# Patient Record
Sex: Female | Born: 1960
Health system: Southern US, Community
[De-identification: ages and names within clinical notes are randomized; demographics above are authoritative.]

## PROBLEM LIST (undated history)

## (undated) DIAGNOSIS — R609 Edema, unspecified: Secondary | ICD-10-CM

## (undated) DIAGNOSIS — F32A Depression, unspecified: Secondary | ICD-10-CM

## (undated) DIAGNOSIS — K219 Gastro-esophageal reflux disease without esophagitis: Secondary | ICD-10-CM

## (undated) DIAGNOSIS — K6389 Other specified diseases of intestine: Secondary | ICD-10-CM

## (undated) DIAGNOSIS — K5090 Crohn's disease, unspecified, without complications: Secondary | ICD-10-CM

## (undated) DIAGNOSIS — F329 Major depressive disorder, single episode, unspecified: Secondary | ICD-10-CM

## (undated) DIAGNOSIS — I10 Essential (primary) hypertension: Secondary | ICD-10-CM

## (undated) DIAGNOSIS — L709 Acne, unspecified: Secondary | ICD-10-CM

## (undated) DIAGNOSIS — N943 Premenstrual tension syndrome: Secondary | ICD-10-CM

## (undated) DIAGNOSIS — E785 Hyperlipidemia, unspecified: Secondary | ICD-10-CM

## (undated) DIAGNOSIS — F419 Anxiety disorder, unspecified: Secondary | ICD-10-CM

## (undated) HISTORY — DX: Other specified diseases of intestine: K63.89

## (undated) HISTORY — DX: Edema, unspecified: R60.9

## (undated) HISTORY — DX: Major depressive disorder, single episode, unspecified: F32.9

## (undated) HISTORY — DX: Crohn's disease, unspecified, without complications: K50.90

## (undated) HISTORY — PX: COLON SURGERY: SHX602

## (undated) HISTORY — DX: Essential (primary) hypertension: I10

## (undated) HISTORY — DX: Premenstrual tension syndrome: N94.3

## (undated) HISTORY — DX: Depression, unspecified: F32.A

## (undated) HISTORY — DX: Hyperlipidemia, unspecified: E78.5

## (undated) HISTORY — DX: Anxiety disorder, unspecified: F41.9

## (undated) HISTORY — DX: Acne, unspecified: L70.9

## (undated) HISTORY — DX: Gastro-esophageal reflux disease without esophagitis: K21.9

---

## 1999-04-24 ENCOUNTER — Other Ambulatory Visit: Admission: RE | Admit: 1999-04-24 | Discharge: 1999-04-24 | Payer: Self-pay | Admitting: Obstetrics and Gynecology

## 2000-06-20 ENCOUNTER — Other Ambulatory Visit: Admission: RE | Admit: 2000-06-20 | Discharge: 2000-06-20 | Payer: Self-pay | Admitting: Obstetrics and Gynecology

## 2002-08-16 ENCOUNTER — Other Ambulatory Visit: Admission: RE | Admit: 2002-08-16 | Discharge: 2002-08-16 | Payer: Self-pay | Admitting: Obstetrics and Gynecology

## 2004-10-29 ENCOUNTER — Ambulatory Visit: Payer: Self-pay | Admitting: Internal Medicine

## 2006-03-10 ENCOUNTER — Other Ambulatory Visit: Admission: RE | Admit: 2006-03-10 | Discharge: 2006-03-10 | Payer: Self-pay | Admitting: Obstetrics & Gynecology

## 2006-06-16 ENCOUNTER — Inpatient Hospital Stay (HOSPITAL_COMMUNITY): Admission: RE | Admit: 2006-06-16 | Discharge: 2006-06-20 | Payer: Self-pay | Admitting: *Deleted

## 2006-06-16 ENCOUNTER — Ambulatory Visit: Payer: Self-pay | Admitting: Psychiatry

## 2008-06-27 ENCOUNTER — Ambulatory Visit: Payer: Self-pay | Admitting: Family Medicine

## 2008-06-27 DIAGNOSIS — F341 Dysthymic disorder: Secondary | ICD-10-CM

## 2008-06-27 DIAGNOSIS — L708 Other acne: Secondary | ICD-10-CM

## 2008-06-27 DIAGNOSIS — R609 Edema, unspecified: Secondary | ICD-10-CM

## 2008-07-26 ENCOUNTER — Ambulatory Visit: Payer: Self-pay | Admitting: Family Medicine

## 2008-07-30 LAB — CONVERTED CEMR LAB
ALT: 28 units/L (ref 0–35)
BUN: 13 mg/dL (ref 6–23)
CO2: 21 meq/L (ref 19–32)
Creatinine, Ser: 0.76 mg/dL (ref 0.40–1.20)
Glucose, Bld: 98 mg/dL (ref 70–99)
Potassium: 4 meq/L (ref 3.5–5.3)
Total Bilirubin: 0.3 mg/dL (ref 0.3–1.2)
Total Protein: 7.4 g/dL (ref 6.0–8.3)

## 2008-08-01 ENCOUNTER — Ambulatory Visit: Payer: Self-pay | Admitting: Family Medicine

## 2008-08-01 DIAGNOSIS — R7989 Other specified abnormal findings of blood chemistry: Secondary | ICD-10-CM | POA: Insufficient documentation

## 2008-08-02 ENCOUNTER — Ambulatory Visit: Payer: Self-pay | Admitting: *Deleted

## 2008-08-09 ENCOUNTER — Ambulatory Visit: Payer: Self-pay | Admitting: *Deleted

## 2008-08-22 ENCOUNTER — Ambulatory Visit: Payer: Self-pay | Admitting: Family Medicine

## 2008-08-22 DIAGNOSIS — R740 Nonspecific elevation of levels of transaminase and lactic acid dehydrogenase [LDH]: Secondary | ICD-10-CM

## 2008-08-22 DIAGNOSIS — I10 Essential (primary) hypertension: Secondary | ICD-10-CM | POA: Insufficient documentation

## 2008-08-22 DIAGNOSIS — R7401 Elevation of levels of liver transaminase levels: Secondary | ICD-10-CM | POA: Insufficient documentation

## 2008-08-22 LAB — CONVERTED CEMR LAB
ALT: 99 units/L — ABNORMAL HIGH (ref 0–35)
AST: 54 units/L — ABNORMAL HIGH (ref 0–37)
Albumin: 3.7 g/dL (ref 3.5–5.2)
Alkaline Phosphatase: 239 units/L — ABNORMAL HIGH (ref 39–117)
Ferritin: 11.1 ng/mL (ref 10.0–291.0)
Total Bilirubin: 0.6 mg/dL (ref 0.3–1.2)
Total Protein: 7.5 g/dL (ref 6.0–8.3)

## 2008-08-26 ENCOUNTER — Encounter: Admission: RE | Admit: 2008-08-26 | Discharge: 2008-08-26 | Payer: Self-pay | Admitting: Family Medicine

## 2008-08-27 ENCOUNTER — Telehealth: Payer: Self-pay | Admitting: Family Medicine

## 2008-09-02 ENCOUNTER — Ambulatory Visit: Payer: Self-pay | Admitting: Family Medicine

## 2008-09-04 LAB — CONVERTED CEMR LAB
AST: 28 units/L (ref 0–37)
Alkaline Phosphatase: 230 units/L — ABNORMAL HIGH (ref 39–117)
Ceruloplasmin: 50 mg/dL (ref 21–63)
HCV Ab: NEGATIVE
Hep B C IgM: NEGATIVE
Total Bilirubin: 0.6 mg/dL (ref 0.3–1.2)

## 2008-09-25 ENCOUNTER — Ambulatory Visit: Payer: Self-pay | Admitting: Gastroenterology

## 2008-09-25 DIAGNOSIS — R945 Abnormal results of liver function studies: Secondary | ICD-10-CM | POA: Insufficient documentation

## 2008-09-25 DIAGNOSIS — R1084 Generalized abdominal pain: Secondary | ICD-10-CM | POA: Insufficient documentation

## 2008-09-25 LAB — CONVERTED CEMR LAB
Alkaline Phosphatase: 199 units/L — ABNORMAL HIGH (ref 39–117)
Bilirubin, Direct: 0.1 mg/dL (ref 0.0–0.3)
INR: 1 (ref 0.8–1.0)
Prothrombin Time: 12.2 s (ref 10.9–13.3)

## 2008-10-01 ENCOUNTER — Telehealth: Payer: Self-pay | Admitting: Gastroenterology

## 2008-10-08 ENCOUNTER — Encounter: Payer: Self-pay | Admitting: Gastroenterology

## 2008-10-08 ENCOUNTER — Ambulatory Visit: Payer: Self-pay | Admitting: Gastroenterology

## 2008-10-08 LAB — CONVERTED CEMR LAB
Basophils Relative: 3.3 % — ABNORMAL HIGH (ref 0.0–3.0)
Eosinophils Absolute: 0.1 10*3/uL (ref 0.0–0.7)
Eosinophils Relative: 1.3 % (ref 0.0–5.0)
INR: 1 (ref 0.8–1.0)
Lymphocytes Relative: 19.5 % (ref 12.0–46.0)
MCHC: 34.4 g/dL (ref 30.0–36.0)
Neutro Abs: 5.6 10*3/uL (ref 1.4–7.7)
Neutrophils Relative %: 72 % (ref 43.0–77.0)
Prothrombin Time: 11.3 s (ref 10.9–13.3)
RBC: 4.51 M/uL (ref 3.87–5.11)
RDW: 12.7 % (ref 11.5–14.6)

## 2008-10-11 ENCOUNTER — Ambulatory Visit: Payer: Self-pay | Admitting: Internal Medicine

## 2008-10-16 ENCOUNTER — Ambulatory Visit: Payer: Self-pay | Admitting: Gastroenterology

## 2008-10-16 DIAGNOSIS — R1903 Right lower quadrant abdominal swelling, mass and lump: Secondary | ICD-10-CM | POA: Insufficient documentation

## 2008-10-22 ENCOUNTER — Encounter: Payer: Self-pay | Admitting: Family Medicine

## 2008-10-22 ENCOUNTER — Encounter: Payer: Self-pay | Admitting: Gastroenterology

## 2008-11-08 DIAGNOSIS — K6389 Other specified diseases of intestine: Secondary | ICD-10-CM

## 2008-11-08 HISTORY — DX: Other specified diseases of intestine: K63.89

## 2008-11-08 HISTORY — PX: HEMICOLECTOMY: SHX854

## 2008-11-11 ENCOUNTER — Encounter: Payer: Self-pay | Admitting: Family Medicine

## 2008-11-11 ENCOUNTER — Encounter (INDEPENDENT_AMBULATORY_CARE_PROVIDER_SITE_OTHER): Payer: Self-pay | Admitting: General Surgery

## 2008-11-11 ENCOUNTER — Inpatient Hospital Stay (HOSPITAL_COMMUNITY): Admission: RE | Admit: 2008-11-11 | Discharge: 2008-11-14 | Payer: Self-pay | Admitting: General Surgery

## 2008-11-11 ENCOUNTER — Encounter: Payer: Self-pay | Admitting: Gastroenterology

## 2008-11-13 ENCOUNTER — Telehealth: Payer: Self-pay | Admitting: Gastroenterology

## 2008-11-25 ENCOUNTER — Ambulatory Visit: Payer: Self-pay | Admitting: Gastroenterology

## 2008-11-25 DIAGNOSIS — K5080 Crohn's disease of both small and large intestine without complications: Secondary | ICD-10-CM | POA: Insufficient documentation

## 2008-11-25 DIAGNOSIS — K5090 Crohn's disease, unspecified, without complications: Secondary | ICD-10-CM | POA: Insufficient documentation

## 2008-11-25 LAB — CONVERTED CEMR LAB
ALT: 28 units/L (ref 0–35)
AST: 21 units/L (ref 0–37)
Alkaline Phosphatase: 129 units/L — ABNORMAL HIGH (ref 39–117)
Basophils Absolute: 0.1 10*3/uL (ref 0.0–0.1)
Basophils Relative: 0.8 % (ref 0.0–3.0)
Eosinophils Absolute: 0.1 10*3/uL (ref 0.0–0.7)
Eosinophils Relative: 1.6 % (ref 0.0–5.0)
HCT: 32.5 % — ABNORMAL LOW (ref 36.0–46.0)
Hemoglobin: 11 g/dL — ABNORMAL LOW (ref 12.0–15.0)
Monocytes Absolute: 0.6 10*3/uL (ref 0.1–1.0)
Monocytes Relative: 8.5 % (ref 3.0–12.0)
Neutro Abs: 4.5 10*3/uL (ref 1.4–7.7)
Neutrophils Relative %: 64.2 % (ref 43.0–77.0)
Platelets: 469 10*3/uL — ABNORMAL HIGH (ref 150–400)
RDW: 13.4 % (ref 11.5–14.6)

## 2008-11-27 ENCOUNTER — Encounter: Payer: Self-pay | Admitting: Gastroenterology

## 2008-11-27 ENCOUNTER — Encounter: Payer: Self-pay | Admitting: Family Medicine

## 2008-11-28 ENCOUNTER — Telehealth: Payer: Self-pay | Admitting: Gastroenterology

## 2008-12-05 ENCOUNTER — Ambulatory Visit: Payer: Self-pay | Admitting: Gastroenterology

## 2008-12-27 ENCOUNTER — Ambulatory Visit: Payer: Self-pay | Admitting: Gastroenterology

## 2008-12-31 ENCOUNTER — Telehealth: Payer: Self-pay | Admitting: Gastroenterology

## 2008-12-31 LAB — CONVERTED CEMR LAB
ALT: 37 units/L — ABNORMAL HIGH (ref 0–35)
Bilirubin, Direct: 0.1 mg/dL (ref 0.0–0.3)
Eosinophils Absolute: 0.1 10*3/uL (ref 0.0–0.7)
Eosinophils Relative: 0.7 % (ref 0.0–5.0)
Hemoglobin: 11.9 g/dL — ABNORMAL LOW (ref 12.0–15.0)
Monocytes Absolute: 0.6 10*3/uL (ref 0.1–1.0)
Neutro Abs: 5.4 10*3/uL (ref 1.4–7.7)
Platelets: 498 10*3/uL — ABNORMAL HIGH (ref 150–400)
RBC: 4.26 M/uL (ref 3.87–5.11)
RDW: 13.3 % (ref 11.5–14.6)

## 2009-01-22 ENCOUNTER — Telehealth: Payer: Self-pay | Admitting: Gastroenterology

## 2009-02-10 ENCOUNTER — Ambulatory Visit: Payer: Self-pay | Admitting: Gastroenterology

## 2009-02-10 ENCOUNTER — Telehealth: Payer: Self-pay | Admitting: Gastroenterology

## 2009-02-10 LAB — CONVERTED CEMR LAB
ALT: 19 units/L (ref 0–35)
Albumin: 3.7 g/dL (ref 3.5–5.2)
Alkaline Phosphatase: 121 units/L — ABNORMAL HIGH (ref 39–117)
Basophils Absolute: 0 10*3/uL (ref 0.0–0.1)
Basophils Relative: 0.8 % (ref 0.0–3.0)
Hemoglobin: 11.9 g/dL — ABNORMAL LOW (ref 12.0–15.0)
Lymphocytes Relative: 27.7 % (ref 12.0–46.0)
Neutro Abs: 3.7 10*3/uL (ref 1.4–7.7)
Total Bilirubin: 0.8 mg/dL (ref 0.3–1.2)
Total Protein: 6.9 g/dL (ref 6.0–8.3)
WBC: 5.7 10*3/uL (ref 4.5–10.5)

## 2009-02-14 ENCOUNTER — Ambulatory Visit: Payer: Self-pay | Admitting: Gastroenterology

## 2009-03-04 IMAGING — CT CT ABDOMEN WO/W CM
2 of 9 series · 11 of 46 positions shown, 17 images · IV contrast (Omnipaque 300)
Comparison: None

CT ABDOMEN

CLINICAL DATA: Elevated liver function tests.  Large right colon
mass recently diagnosed by colonoscopy.

CT ABDOMEN AND PELVIS WITHOUT AND WITH CONTRAST
TECHNIQUE: Multidetector CT imaging of the abdomen and pelvis was
performed following the standard protocol before and following the
bolus administration of intravenous contrast.
Contrast: 125 ml 5mnipaque-NUU and oral contrast

[Series 6: ap_venous 5.0 b30f · axial · 0.82mm/px · z∈[-503,-123]mm · 8 of 98 slices shown, 13 images]
[im 11/98  soft-tissue]
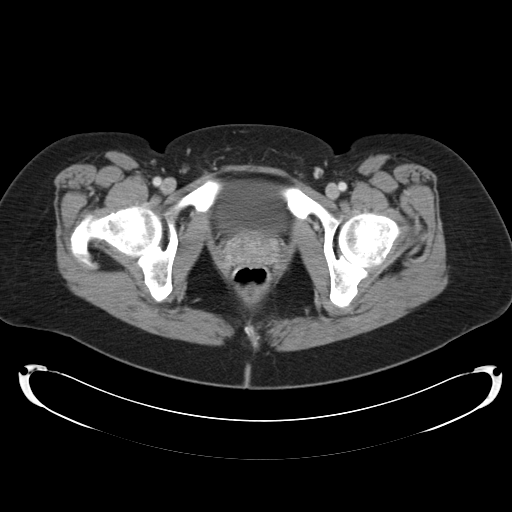
[im 11/98  bone]
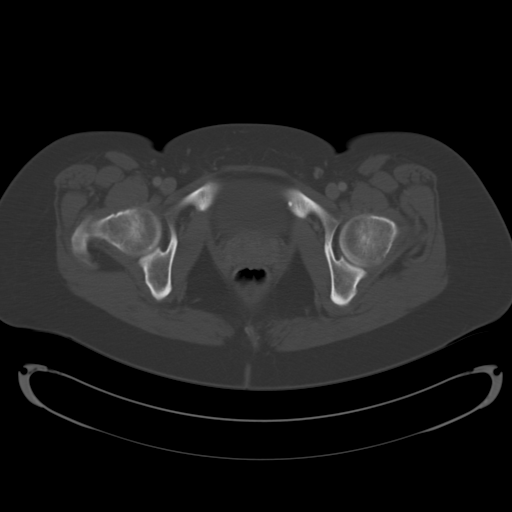
[im 22/98  soft-tissue]
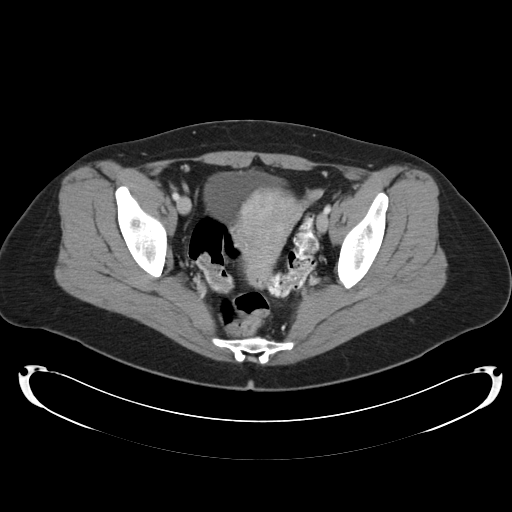
[im 33/98  soft-tissue]
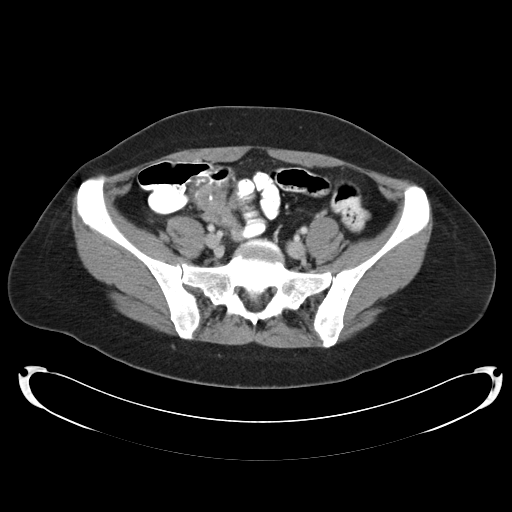
[im 44/98  soft-tissue]
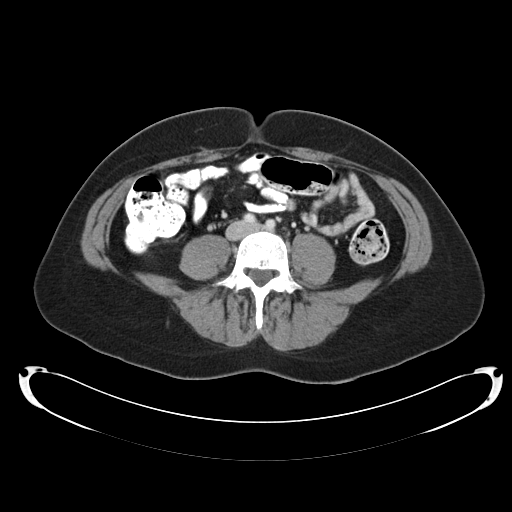
[im 54/98  soft-tissue]
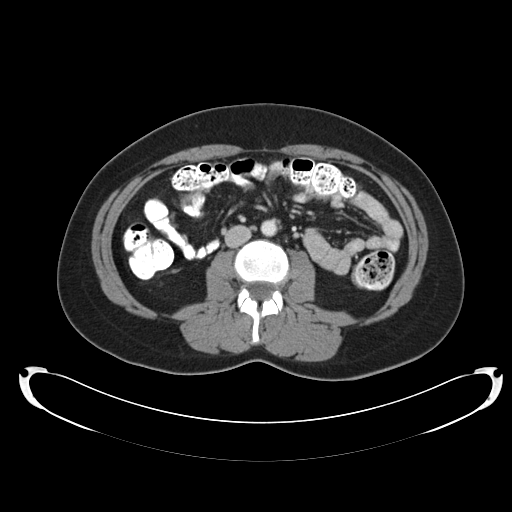
[im 54/98  lung]
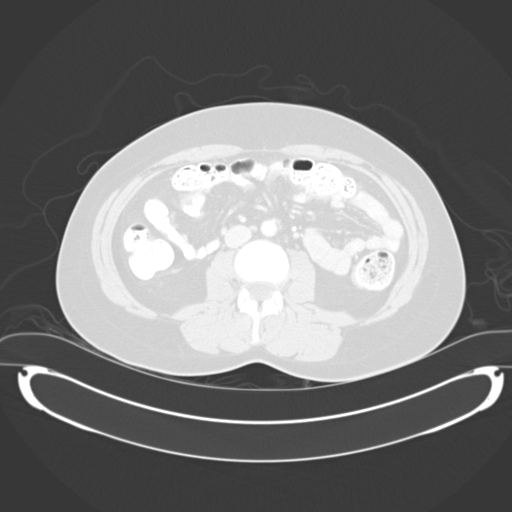
[im 65/98  soft-tissue]
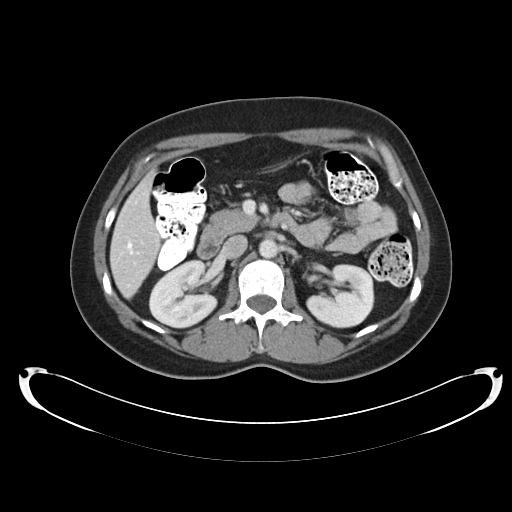
[im 65/98  lung]
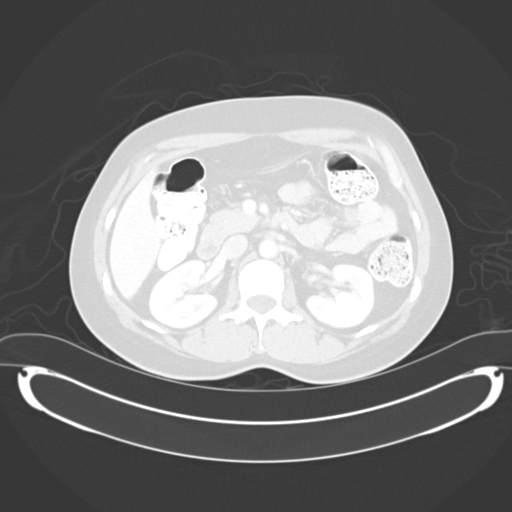
[im 76/98  soft-tissue]
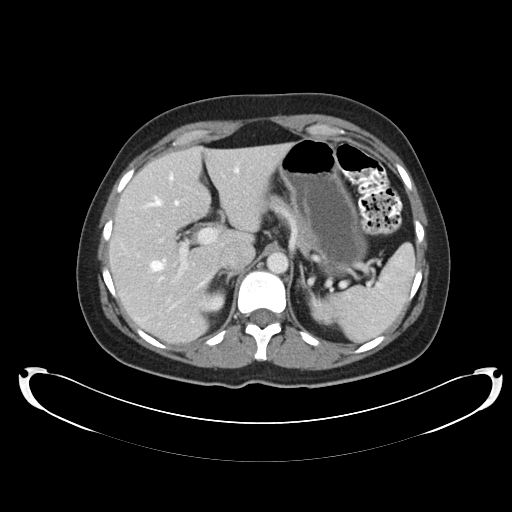
[im 76/98  lung]
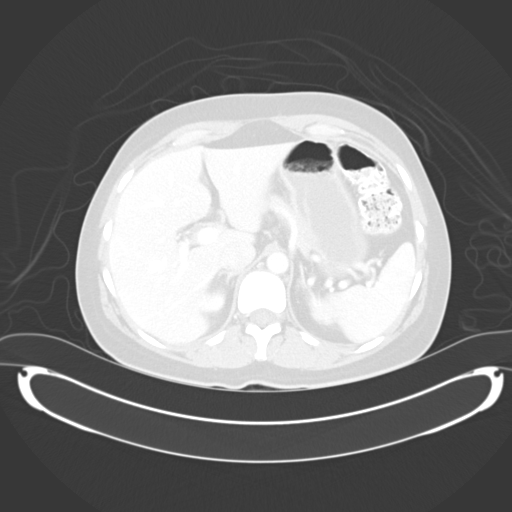
[im 87/98  soft-tissue]
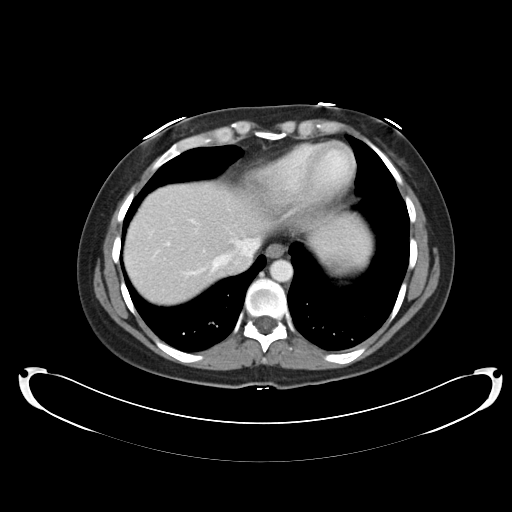
[im 87/98  lung]
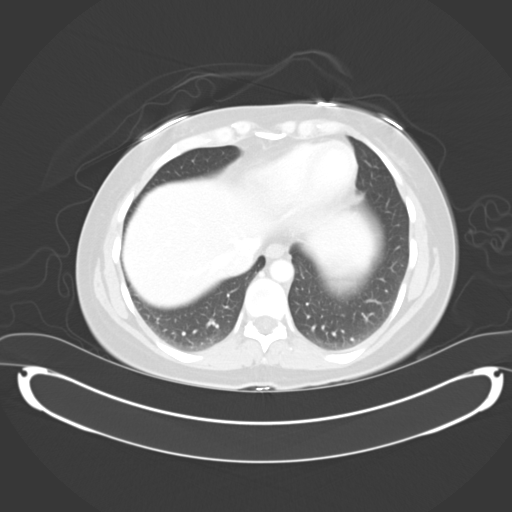

[Series 602: <mpr thick range> · coronal · 0.82mm/px · 3 of 71 slices shown, 4 images]
[im 18/71  soft-tissue]
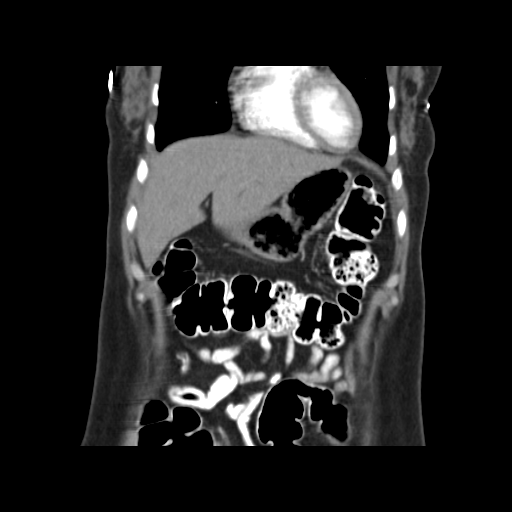
[im 36/71  soft-tissue]
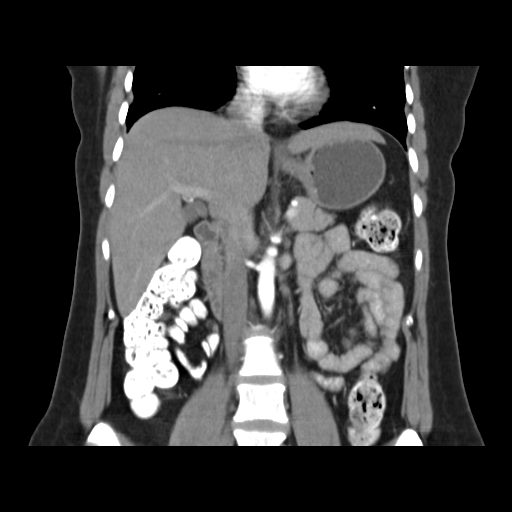
[im 36/71  bone]
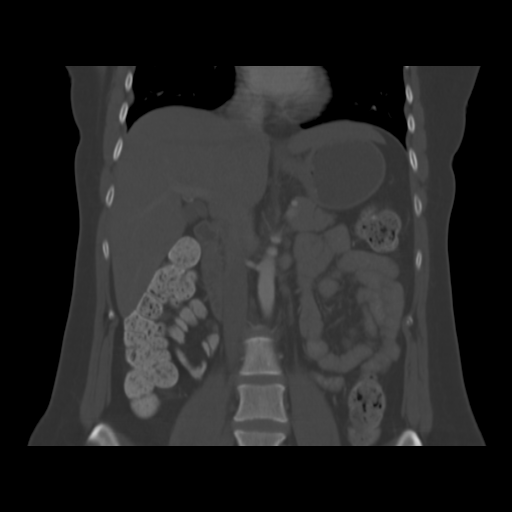
[im 53/71  soft-tissue]
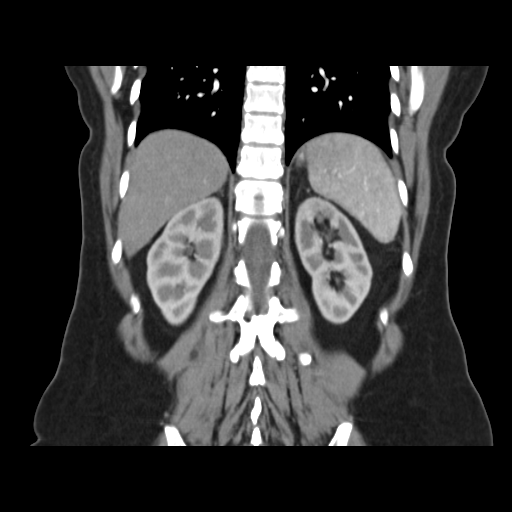

[11 of 46 positions shown; findings below may reference images not displayed]

FINDINGS: Precontrast images show the presence of a small less
than 5 mm calculus in the mid pole of the left kidney.  There is no
evidence of hydronephrosis.

Postcontrast images show no evidence of renal masses.  A tiny less
than 1 cm cyst is seen in the mid pole left kidney and a tiny less
than 1 cm cyst is also noted in the inferior aspect of the spleen.

The liver and gallbladder are normal appearance.  The pancreas is
also normal appearance as well as both adrenal glands.  There is no
evidence of abdominal lymphadenopathy or soft tissue masses.

There is no evidence of inflammatory process or abnormal fluid
collections within the abdomen.  Abdominal bowel loops are
unremarkable in appearance.
IMPRESSION: 1.  No evidence of abdominal metastatic disease or other acute
findings.
2.  Single small nonobstructing left intrarenal calculus.  No
evidence of hydronephrosis.

CT PELVIS
FINDINGS: A heterogeneously enhancing soft tissue mass is seen,
which involves the base of the cecum in expected region of the
appendix.  This measures 3.8 x 3.8 cm.  This mass has very poorly
defined margins and also shows central calcification.  These
features raise suspicion for appendiceal carcinoid tumor, although
differential diagnosis also includes colonic adenocarcinoma,
ovarian neoplasm or endometriosis with extrinsic involvement of the
cecum, chronic appendicitis and lymphoma.

There are small shotty mesenteric lymph nodes seen in the right
lower quadrant superior to this mass, all measuring less than 1 cm.
There is no evidence of other soft tissue masses or acute
inflammatory process.  No abnormal fluid collections are seen.
There is no evidence of bowel obstruction.
IMPRESSION: 1. 3.8 cm soft tissue mass involving the base of the cecum in the
region of the appendix, which shows central calcification.  These
features are highly suspicious for an appendiceal carcinoid tumor.
Differential diagnosis also includes colonic adenocarcinoma,
ovarian neoplasm or endometriosis with extrinsic involvement of the
cecum, chronic appendicitis, and lymphoma.
2.  Adjacent shotty lymph nodes in right lower quadrant small bowel
mesentery.

## 2009-03-24 ENCOUNTER — Ambulatory Visit: Payer: Self-pay | Admitting: Gastroenterology

## 2009-03-24 ENCOUNTER — Telehealth: Payer: Self-pay | Admitting: Gastroenterology

## 2009-03-24 LAB — CONVERTED CEMR LAB
ALT: 22 units/L (ref 0–35)
AST: 21 units/L (ref 0–37)
Basophils Relative: 0.4 % (ref 0.0–3.0)
Eosinophils Absolute: 0.1 10*3/uL (ref 0.0–0.7)
HCT: 36.4 % (ref 36.0–46.0)
Lymphocytes Relative: 15.8 % (ref 12.0–46.0)
Lymphs Abs: 1.3 10*3/uL (ref 0.7–4.0)
MCHC: 34.7 g/dL (ref 30.0–36.0)
MCV: 87.3 fL (ref 78.0–100.0)
Monocytes Absolute: 0.5 10*3/uL (ref 0.1–1.0)
Neutrophils Relative %: 76.3 % (ref 43.0–77.0)
RDW: 18.3 % — ABNORMAL HIGH (ref 11.5–14.6)
Total Bilirubin: 0.7 mg/dL (ref 0.3–1.2)
WBC: 8.1 10*3/uL (ref 4.5–10.5)

## 2009-03-25 ENCOUNTER — Ambulatory Visit: Payer: Self-pay | Admitting: Family Medicine

## 2009-03-25 LAB — CONVERTED CEMR LAB
HDL goal, serum: 40 mg/dL
LDL Goal: 160 mg/dL

## 2009-04-15 ENCOUNTER — Encounter (INDEPENDENT_AMBULATORY_CARE_PROVIDER_SITE_OTHER): Payer: Self-pay | Admitting: *Deleted

## 2009-04-16 ENCOUNTER — Telehealth: Payer: Self-pay | Admitting: Gastroenterology

## 2009-05-14 ENCOUNTER — Encounter: Payer: Self-pay | Admitting: Family Medicine

## 2009-09-18 ENCOUNTER — Telehealth: Payer: Self-pay | Admitting: Family Medicine

## 2010-02-19 ENCOUNTER — Telehealth: Payer: Self-pay | Admitting: Family Medicine

## 2010-12-10 NOTE — Progress Notes (Signed)
Summary: refill request for ambien  Phone Note Refill Request Message from:  Fax from Pharmacy  Refills Requested: Medication #1:  AMBIEN 10 MG TABS 1 by mouth at bedtime as needed.   Last Refilled: 09/18/2009 Faxed request from [address], [phone].  Initial call taken by: Marty Heck CMA,  February 19, 2010 9:10 AM  Follow-up for Phone Call        px written on EMR for call in  Follow-up by: Allena Earing MD,  February 19, 2010 9:16 AM  Additional Follow-up for Phone Call Additional follow up Details #1::        Medication phoned to [facility] as instructed. Ozzie Hoyle LPN  February 20, 2492 2:41 AM     New/Updated Medications: AMBIEN 10 MG TABS (ZOLPIDEM TARTRATE) 1 by mouth at bedtime as needed Prescriptions: AMBIEN 10 MG TABS (ZOLPIDEM TARTRATE) 1 by mouth at bedtime as needed  #30 x 0   Entered and Authorized by:   Allena Earing MD   Signed by:   Ozzie Hoyle LPN on 99/14/4458   Method used:   Telephoned to ...       [facility] (retail)       [address]       [place], [place]  [address]       Ph: [phone] or [phone]       Fax: [phone]   RxID:   [id number]

## 2011-02-22 LAB — BASIC METABOLIC PANEL
CO2: 29 mEq/L (ref 19–32)
Chloride: 100 mEq/L (ref 96–112)
Creatinine, Ser: 0.64 mg/dL (ref 0.4–1.2)
GFR calc non Af Amer: >60 mL/min (ref >60)
Glucose, Bld: 125 mg/dL — ABNORMAL HIGH (ref 70–99)

## 2011-02-22 LAB — CBC
Hemoglobin: 10.4 g/dL — ABNORMAL LOW (ref 12.0–15.0)
RBC: 3.81 MIL/uL — ABNORMAL LOW (ref 3.87–5.11)
RDW: 14 % (ref 11.5–15.5)

## 2011-03-23 NOTE — Discharge Summary (Signed)
[name], [name] NO.:  [id number]   MEDICAL RECORD NO.:  [id number]          PATIENT TYPE:  INP   LOCATION:  5118                         FACILITY:  [facility]   PHYSICIAN:  Dickey Gave, MDDATE OF BIRTH:  1961-03-08   DATE OF ADMISSION:  11/11/2008  DATE OF DISCHARGE:  11/14/2008                               DISCHARGE SUMMARY   ADMISSION DIAGNOSES:  1. Intraabdominal mass.  2. Hypertension.  3. Depression.   DISCHARGE DIAGNOSES:  1. Intraabdominal mass.  2. Hypertension.  3. Depression.   PROCEDURES PERFORMED:  Laparoscopic-assisted right colectomy on November 11, 2008.   DISCHARGE MEDICATIONS:  Percocet as needed and Xanax p.r.n.   FOLLOWUP:  Followup in 1 week for staple removal in the General Surgery  Clinic.   HISTORY OF PRESENT ILLNESS:  Veronica [name] is a 50 year old female who on  evaluation for some abnormal liver function test underwent a colonoscopy  due to her family history, which showed a flat-based lesion in the cecum  suggestive of villous adenoma or carcinoma.  On biopsy, they showed  inflamed colonic mucosa with chronic changes.  She then underwent a CT  scan, which showed a 3.8 x 3.8 cm enhancing soft tissue mass involving  the base of the cecum in the region of the appendix.  She was referred  for surgical evaluation.  I took her on January 4, for a laparoscopic-  assisted right colectomy, removed the mass in the mesentery associated  with the terminal ileum and the cecum.  Over the next 3 days, she was  advanced on her diet.  She was on Entereg during this hospital stay.  By  postop day 3, she was passing flatus, had a small bowel movement and was  tolerating her diet and I discharged her home with her incision being  clean without any evidence of infection.      Dickey Gave, MD  Electronically Signed     MCW/MEDQ  D:  12/25/2008  T:  12/25/2008  Job:  [id number]   cc:   Sandy Salaam. Deatra Ina, MD,FACG

## 2011-03-23 NOTE — Op Note (Signed)
NAMECHELISE, HANGER NO.:  [id number]   MEDICAL RECORD NO.:  [id number]          PATIENT TYPE:  INP   LOCATION:  5118                         FACILITY:  [facility]   PHYSICIAN:  Dickey Gave, MDDATE OF BIRTH:  1960/12/21   DATE OF PROCEDURE:  11/11/2008  DATE OF DISCHARGE:                               OPERATIVE REPORT   PREOPERATIVE DIAGNOSIS:  Right colon mass.   POSTOPERATIVE DIAGNOSIS:  Right colon/terminal ileal mesenteric mass.   PROCEDURE:  Laparoscopic-assisted right hemicolectomy.   SURGEON:  Mila Homer. Donne Hazel, MD   ASSISTANT:  Haywood Lasso, MD   ANESTHESIA:  General.   FINDINGS:  Mass in mesentery associated with terminal ileum and cecum  abutting the cecum.   SPECIMENS:  Right colon to Pathology.   ESTIMATED BLOOD LOSS:  75 mL.   COMPLICATIONS:  None.   DRAINS:  None.   DISPOSITION:  To the recovery room in stable condition.   INDICATIONS:  Mrs. Serpa is a 50 year old female who was seen by her  primary care physician for evaluation of a blood pressure.  She was  noticed on lab evaluation to have some abnormal liver function tests.  She was then referred to see Dr. Erskine Emery for evaluation who  evaluated her liver function tests as well as scheduled for colonoscopy  due to a family history of colon cancer in her mother.  She underwent a  colonoscopy which showed a flap-based lesion in the cecum suggestive of  villous adenoma or carcinoma where the biopsy showed polypoid fragments  of inflamed colonic mucosa with chronic changes and anterior mucosal  lymphoplasmacytic infiltrate.  There were no adenomatous changes,  dysplasia, or malignancy.  Overall findings were most consistent with  inflammatory process.  From there, Dr. Deatra Ina referred her for a CT scan  which showed a 3.8 x 3.8 cm heterogeneously enhancing soft tissue mass  involving the base of the cecum in the region of the appendix.  She was  then referred for  surgical evaluation.  I discussed with her  laparoscopic-assisted right hemicolectomy likely for the therapy for  what this mass appears to be, but did discuss also possible right  oophorectomy or possible further small bowel resection depending on the  operative findings.  She wished to this and we proceeded with her  operation.   OPERATION:  After informed consent was obtained, the patient was first  placed on preoperative Entereg and administered 1 g of intravenous  Invanz.  She had sequential compression devices whichwere placed on the  legs prior to induction.  She then underwent general endotracheal  anesthesia without complication.  She was placed in the low lithotomy  position and appropriately padded.  Her abdomen was then prepped and  draped in a standard sterile surgical fashion.  A surgical time-out was  then performed.   A 10-mm incision was then made above her umbilicus.  Dissection was  carried out down to the level of her fascia which was entered sharply .  A 0 Vicryl pursestring suture was then placed through her fascia.  A  Hasson Trocar was then  inserted in the abdomen.  The abdomen was then  insufflated to 15 mmHg pressure.  A further 5-mm port was placed in the  suprapubic position after infiltration with local anesthetic under  direct vision without complication.  An additional two 5-mm ports were  placed in the left lower quadrant and right lower quadrant after  infiltration with local anesthetic under direct vision without  complication.  The colon was then began to reflected medially.  The  Harmonic scalpel was used to take down the white line of Toldt and  medialize right colon.  This was very adherent in the region of the  cecum where the inflammatory mass appeared to be.  This whole area was  very scarred to her right ovary and was a difficult dissection.  The  right ovary was eventually removed from the mass.  Following this, the  colon was medialized the  ureter, was identified in its entire course and  preserved throughout the entire operation.  The colon continued to be  medialized.  A further 5-mm port was then placed in the left upper  quadrant and the colon was taken down at the hepatic flexure and  disconnected from the stomach with the harmonic scalpel.  Following  this, colon appeared to be rolled medial and superior and cephalad  enough that we would remove it through a hand port, then made an 8-cm  incision including the prior Hassan incision above the umbilicus and  inserted the wound protector.  The cecum, terminal ileum, and entire  right colon including the transverse colon was then brought very easily  through the wound.  A segment on the transverse colon was identified to  preserve the blood supply and a Kelly clamp was placed inside of this  and a GIA stapler used to divide the colon.  Similar portion was chosen  on the terminal ileum.  This GIA stapler was then used to disconnect the  internal ileum.  The Bovie was then used to come through the superficial  layer of the mesentery.  Kelly clamps were then used to remove the  colon, did place a 2-0 silk ligature at the vessels.  Following this,  this was passed off to table as a specimen.  There was a large about 4-  cm area of lump in the region of the cecum that was removed during this.  I could not really identify the appendix confidently during this portion  of the procedure.  The gonadal vein was attached to this area and was  tied off during this portion as well.  Following this, the colon and the  small bowel were brought next to each other.  A 3-0 silk was used to  attach the ends together.  A GIA stapler was then inserted and fired  creating the common enterotomy.  A ring forceps was used to inspect the  anastomosis and this was appeared to be hemostatic.  Allis clamps were  then used to grab the common enterotomy and a TA60 stapler was used to  close the common  enterotomy.  This area bled well and was oversewn with  several 3-0 silk ligatures to control the bleeding from the anastomosis.  Following this, mesenteric defect was closed with a 3-0 Vicryl suture  and this was then placed back into the abdomen with the omentum rolled  over it.  The incision was then closed with a #1 PDS.  Following this,  the abdomen was then reinsufflated.  The camera was  then placed back  into the abdomen.  There was initial report the sponge count was  incorrect.  I have surveyed the entire abdomen, found no sponge in the  abdomen.  The sponge was later found in the trash.  All sponge count and  instrument counts were all correct at the completion of the operation.  Everything appeared to be hemostatic.  I did trace the ureter one  additional time and noted it to be preserved.  The area of the gonadal  vein, we had tied off, I had placed one additional clip more proximally  on this as well.  Otherwise, I removed all fluid from the abdomen  desufflated the abdomen and all the trocars were removed without  complication.  I then closed all the incisions with a 4-0 Monocryl  except for the midline incision, which was closed with staples.  She  tolerated this well, was extubated in the operating room, was  transferred to the recovery room in stable condition.      Dickey Gave, MD  Electronically Signed     MCW/MEDQ  D:  11/11/2008  T:  11/11/2008  Job:  [id number]   cc:   Sandy Salaam. Deatra Ina, MD,FACG  Michiana Glori Bickers, MD

## 2011-03-26 NOTE — Discharge Summary (Signed)
NAMEJAYSHA, Veronica [name] NO.:  [id number]   MEDICAL RECORD NO.:  [id number]          PATIENT TYPE:  IPS   LOCATION:  0306                          [facility]ILITY[facility]   PHYSICIAN:  Norm Salt, MD  DATE OF BIRTH:  Jun 15, 1961   DATE OF ADMISSION:  06/16/2006  DATE OF DISCHARGE:  06/20/2006                                 DISCHARGE SUMMARY   IDENTIFYING DATA AND REASON FOR ADMISSION:  The patient is a divorced white  female, mother of two, working, with a history of depression, recurrent.  She had been seeing B. Rice for one-to-one therapy and Gala Murdoch for  medication management.  She had recently become increasingly depressed with  suicidal ideation in the context of multiple stressors.  She had been on no  medication since March 2007.  She had previously had trials of Lexapro,  which the patient had described as beneficial for her mood but causing  cognitive clouding and constipation.  Please refer to the admission note for  further details pertaining to the symptoms, circumstances and history that  led to her hospitalization.  She was given an initial Axis I diagnosis of  major depressive disorder, recurrent, without psychotic features.   MEDICAL AND LABORATORY:  The patient was medically and physically assessed  by the psychiatric nurse practitioner.  She was in good health without any  active or chronic medical problems.  She was continued on her usual oral  contraceptive.   HOSPITAL COURSE:  The patient was admitted to the adult inpatient  psychiatric service.  She presented as a well-nourished, well-developed  woman who was fully oriented, without any signs or symptoms of psychosis.  She was pleasant, but her mood was very depressed with sad affect.  She  denied suicidal ideation, she wanted help.   The patient agreed to a trial of Celexa 10 mg daily to address depressive  symptoms.  This was well tolerated.   By the third hospital day, the patient  was significantly less emotional and  tearful.  The patient declined arrangement of a family session.  By the  fifth hospital day she appeared appropriate for discharge.   AFTERCARE:  The patient was to follow up with Gala Murdoch and Loni Beckwith with an appointment on July 20, 2006.   DISCHARGE MEDICATIONS:  1. Celexa 10 mg daily.  2. Ambien 10 mg as needed for sleep.  3. Her usual oral contraceptive.   DISCHARGE DIAGNOSES:  AXIS I:  Major depressive disorder, recurrent, without  psychotic features.  AXIS II:  Deferred.  AXIS III:  No acute or chronic illnesses.  AXIS IV:  Stressors severe.  AXIS V:  GAF on discharge 65.      Norm Salt, MD  Electronically Signed     SPB/MEDQ  D:  07/27/2006  T:  07/28/2006  Job:  [id number]

## 2011-05-07 ENCOUNTER — Encounter: Payer: Self-pay | Admitting: Family Medicine

## 2011-05-07 ENCOUNTER — Ambulatory Visit (INDEPENDENT_AMBULATORY_CARE_PROVIDER_SITE_OTHER): Payer: BC Managed Care – PPO | Admitting: Family Medicine

## 2011-05-07 DIAGNOSIS — I10 Essential (primary) hypertension: Secondary | ICD-10-CM

## 2011-05-07 DIAGNOSIS — T148XXA Other injury of unspecified body region, initial encounter: Secondary | ICD-10-CM

## 2011-05-07 MED ORDER — ATENOLOL 25 MG PO TABS: 25.0000 mg | ORAL_TABLET | Freq: Every day | ORAL | Status: DC

## 2011-05-07 NOTE — Assessment & Plan Note (Addendum)
Check labs and start BB, since intolerant of HCTZ.  >25 min spent with face to face with patient, >50% counseling.  See plan.  She understood about low salt diet.

## 2011-05-07 NOTE — Patient Instructions (Signed)
Start the atenolol, take 1 a day.  You can get your results through our phone system.  Follow the instructions on the blue card. I want you to schedule a follow up for 2 weeks to check your blood pressure.  Bring in some readings from home- check your pressure after you urinate in the morning.  Look at the [facility] for low salt options.

## 2011-05-07 NOTE — Progress Notes (Signed)
Thought she had a mosquito bite on R calf yesterday.  She scratched it.  Last night bruise noted in the area, no other known trauma.  No other bleeding/bruising on the skin except for small resolved bruise on L thigh.  No blood in stool, urine.  No nosebleeds recently, but this did happen in the past.  Had been checking BP, was ~160/100 on outside checks.  No CP.  Some B ankle edema, L>R.  Prev didn't tolerate HCTZ. Not SOB with exertion but occ feeling where she doesn't get a good deep breath.  No wheeze.  No cough.  No FCNAVD.   "I'm overweight and I don't exercise."   +FH of HTN.    Meds, vitals, and allergies reviewed.   ROS: See HPI.  Otherwise, noncontributory.  GEN: nad, alert and oriented HEENT: mucous membranes moist, nasal and oral exam wnl NECK: supple w/o LA CV: rrr PULM: ctab, no inc wob ABD: soft, +bs EXT: no edema SKIN: no acute rash but typical appearing bruise noted on the R calf w/o other skin changes noted.

## 2011-05-08 LAB — COMPREHENSIVE METABOLIC PANEL
ALT: 21 U/L (ref 0–35)
AST: 20 U/L (ref 0–37)
BUN: 7 mg/dL (ref 6–23)
CO2: 22 mEq/L (ref 19–32)
Calcium: 9.7 mg/dL (ref 8.4–10.5)
Chloride: 102 mEq/L (ref 96–112)
Glucose, Bld: 82 mg/dL (ref 70–99)
Total Protein: 7.8 g/dL (ref 6.0–8.3)

## 2011-05-08 LAB — CBC WITH DIFFERENTIAL/PLATELET
Eosinophils Relative: 1 % (ref 0–5)
Lymphocytes Relative: 26 % (ref 12–46)
MCV: 86.3 fL (ref 78.0–100.0)
Monocytes Absolute: 0.7 10*3/uL (ref 0.1–1.0)
RBC: 4.76 MIL/uL (ref 3.87–5.11)
RDW: 13.6 % (ref 11.5–15.5)
WBC: 7.1 10*3/uL (ref 4.0–10.5)

## 2011-05-08 NOTE — Assessment & Plan Note (Signed)
See notes on labs.  Nontoxic.

## 2011-05-12 ENCOUNTER — Encounter: Payer: Self-pay | Admitting: Family Medicine

## 2011-05-21 ENCOUNTER — Encounter: Payer: Self-pay | Admitting: Family Medicine

## 2011-05-21 ENCOUNTER — Ambulatory Visit (INDEPENDENT_AMBULATORY_CARE_PROVIDER_SITE_OTHER): Payer: BC Managed Care – PPO | Admitting: Family Medicine

## 2011-05-21 DIAGNOSIS — I10 Essential (primary) hypertension: Secondary | ICD-10-CM

## 2011-05-21 NOTE — Assessment & Plan Note (Signed)
Much improved with tenormin and better diet  Plans to start exercise soon  Will watch for dizziness or hypotension and update me F/u 6 mo

## 2011-05-21 NOTE — Progress Notes (Signed)
Subjective:    Patient ID: Veronica [name], female    DOB: 09-13-1961, 50 y.o.   MRN: [id number]  HPI Here for f/u of HTN  Started on tenormin last visit (is intol of hctz) bp good today 118/78 Labs looked ok for HTN incl comp met   Was lightheaded at first- better now  Wt is down 3 lb -- joined wt [employer] on line  Started last weekend  Next step is exercise  Will use the xbox connect for exercise program   Patient Active Problem List  Diagnoses  . ANXIETY DEPRESSION  . ESSENTIAL HYPERTENSION  . RGN ENTERITIS SMALL INTESTINE W/LG INTESTINE  . CROHN'S DISEASE  . ACNE, MILD  . EDEMA  . ABDOMINAL PAIN-GENERALIZED  . ABDOMINAL MASS, RIGHT LOWER QUADRANT  . TRANSAMINASES, SERUM, ELEVATED  . OTHER ABNORMAL BLOOD CHEMISTRY  . NONSPECIFIC ABNORMAL RESULTS LIVR FUNCTION STUDY  . Bruising   Past Medical History  Diagnosis Date  . PMS (premenstrual syndrome)   . Depression   . Hypertension   . Edema   . Colonic mass 1/10  . Acne    Past Surgical History  Procedure Date  . Hemicolectomy 1/10   History  Substance Use Topics  . Smoking status: Never Smoker   . Smokeless tobacco: Not on file  . Alcohol Use: No   Family History  Problem Relation Age of Onset  . Colon cancer Mother   . Alcohol abuse Brother   . Hypertension Paternal Grandfather   . Diabetes Paternal Grandfather   . Coronary artery disease Paternal Grandfather   . Coronary artery disease Maternal Grandfather   . Aneurysm Paternal Grandmother     cerebral  . Hypertension Paternal Grandmother   . Pulmonary embolism Father   . Aneurysm Father     cerebral  . Alcohol abuse Son   . Drug abuse Son   . Depression Other     fam hx   Allergies  Allergen Reactions  . Hydrochlorothiazide     Dizzy and felt faint  . Fluoxetine Hcl     REACTION: urticartia  . Venlafaxine     REACTION: weight gain, makes sleepy  . Bupropion Hcl     REACTION: tinnitus previously but no other symptoms.  She tolerated it  on second trial w/o return of tinnitus.     Current Outpatient Prescriptions on File Prior to Visit  Medication Sig Dispense Refill  . atenolol (TENORMIN) 25 MG tablet Take 1 tablet (25 mg total) by mouth daily.  30 tablet  11  . buPROPion (WELLBUTRIN XL) 150 MG 24 hr tablet Take 150 mg by mouth daily.        Marland Kitchen azaTHIOprine (IMURAN) 50 MG tablet Take 2 tablets per day       . clonazePAM (KLONOPIN) 0.5 MG tablet Take one by mouth as needed       . zolpidem (AMBIEN) 10 MG tablet Take 10 mg by mouth at bedtime as needed.             Review of Systems Review of Systems  Constitutional: Negative for fever, appetite change, fatigue and unexpected weight change.  Eyes: Negative for pain and visual disturbance.  Respiratory: Negative for cough and shortness of breath.   Cardiovascular: Negative. For cp or sob or palpitations  Gastrointestinal: Negative for nausea, diarrhea and constipation.  Genitourinary: Negative for urgency and frequency.  Skin: Negative for pallor. or rash Neurological: Negative for weakness, light-headedness, numbness and headaches.  Hematological: Negative for  adenopathy. Does not bruise/bleed easily.  Psychiatric/Behavioral: Negative for dysphoric mood. The patient is not nervous/anxious.          Objective:   Physical Exam  Constitutional: She appears well-developed and well-nourished. No distress.  HENT:  Head: Normocephalic and atraumatic.  Mouth/Throat: Oropharynx is clear and moist.  Eyes: Conjunctivae and EOM are normal. Pupils are equal, round, and reactive to light.  Neck: Normal range of motion. Neck supple. No JVD present. Carotid bruit is not present. No thyromegaly present.  Cardiovascular: Normal rate, regular rhythm, normal heart sounds and intact distal pulses.   Pulmonary/Chest: Effort normal and breath sounds normal.  Musculoskeletal: She exhibits no edema and no tenderness.  Lymphadenopathy:    She has no cervical adenopathy.  Neurological:  She is alert. She has normal reflexes.  Skin: Skin is warm and dry. No rash noted. No erythema. No pallor.  Psychiatric: She has a normal mood and affect.       Quite cheerful today          Assessment & Plan:

## 2011-05-21 NOTE — Patient Instructions (Signed)
Continue the tenormin (atenolol) - it is working great  If bp is less than 90/50 let us know  Or if increasing dizziness Go forward with healthy diet and exercise Follow up in 6 months

## 2011-07-01 ENCOUNTER — Telehealth: Payer: Self-pay | Admitting: *Deleted

## 2011-07-01 MED ORDER — AMLODIPINE BESYLATE 5 MG PO TABS: 5.0000 mg | ORAL_TABLET | Freq: Every day | ORAL | Status: DC

## 2011-07-01 NOTE — Telephone Encounter (Signed)
Stop it and wait one week to make sure symptoms resolve If they do then start norvasc 5 mg one pill each am Px written for call in  (unsure what pharmacy) F/u 1 mo  Update if problems or side eff with that

## 2011-07-01 NOTE — Telephone Encounter (Signed)
Pt states that ever since she started taking atenolol she has felt very tired, fatigued.  She thought this would improve but it has not.  She asks if she should change to a different medicine, does she need office visit?

## 2011-07-01 NOTE — Telephone Encounter (Signed)
Patient notified. She will hold off on meds for 1 week and see how she feels. Follow up scheduled and Rx called into [facility]. Instructed to call with any problems,questions or concerns.

## 2011-08-04 ENCOUNTER — Ambulatory Visit (INDEPENDENT_AMBULATORY_CARE_PROVIDER_SITE_OTHER): Payer: BC Managed Care – PPO | Admitting: Family Medicine

## 2011-08-04 ENCOUNTER — Encounter: Payer: Self-pay | Admitting: Family Medicine

## 2011-08-04 DIAGNOSIS — I10 Essential (primary) hypertension: Secondary | ICD-10-CM

## 2011-08-04 DIAGNOSIS — R609 Edema, unspecified: Secondary | ICD-10-CM

## 2011-08-04 DIAGNOSIS — F341 Dysthymic disorder: Secondary | ICD-10-CM

## 2011-08-04 NOTE — Progress Notes (Signed)
Subjective:    Patient ID: Veronica [name], female    DOB: 10/08/1961, 50 y.o.   MRN: [id number]  HPI Here for f/u of HTN  Last visit was well controlled on tenormin- but pt c/o tiredness as a side eff later so was changed to norvasc bp today 122/84-- good   Doing much better on this med overall  Had ha for few days and that is better now  No problems at all   Labs ok in June  Wt is down 7 lb with bmi of 24- doing great with diet and exercise  No exercise yet - wants to make a plan now-- will probably walk  Has some exercise programs at home  Lab Results  Component Value Date   TSH 1.575 07/26/2008     Doing well with the eating - no more snacks and cutting back portions -- good   Declines flu shot today No particular reason  Patient Active Problem List  Diagnoses  . ANXIETY DEPRESSION  . ESSENTIAL HYPERTENSION  . RGN ENTERITIS SMALL INTESTINE W/LG INTESTINE  . CROHN'S DISEASE  . ACNE, MILD  . EDEMA  . ABDOMINAL PAIN-GENERALIZED  . ABDOMINAL MASS, RIGHT LOWER QUADRANT  . TRANSAMINASES, SERUM, ELEVATED  . OTHER ABNORMAL BLOOD CHEMISTRY  . NONSPECIFIC ABNORMAL RESULTS LIVR FUNCTION STUDY  . Bruising   Past Medical History  Diagnosis Date  . PMS (premenstrual syndrome)   . Depression   . Hypertension   . Edema   . Colonic mass 1/10  . Acne    Past Surgical History  Procedure Date  . Hemicolectomy 1/10   History  Substance Use Topics  . Smoking status: Never Smoker   . Smokeless tobacco: Not on file  . Alcohol Use: No   Family History  Problem Relation Age of Onset  . Colon cancer Mother   . Alcohol abuse Brother   . Hypertension Paternal Grandfather   . Diabetes Paternal Grandfather   . Coronary artery disease Paternal Grandfather   . Coronary artery disease Maternal Grandfather   . Aneurysm Paternal Grandmother     cerebral  . Hypertension Paternal Grandmother   . Pulmonary embolism Father   . Aneurysm Father     cerebral  . Alcohol abuse Son    . Drug abuse Son   . Depression Other     fam hx   Allergies  Allergen Reactions  . Hydrochlorothiazide     Dizzy and felt faint  . Fluoxetine Hcl     REACTION: urticartia  . Tenormin (Atenolol)     fatigue  . Venlafaxine     REACTION: weight gain, makes sleepy  . Bupropion Hcl     REACTION: tinnitus previously but no other symptoms.  She tolerated it on second trial w/o return of tinnitus.     Current Outpatient Prescriptions on File Prior to Visit  Medication Sig Dispense Refill  . amLODipine (NORVASC) 5 MG tablet Take 1 tablet (5 mg total) by mouth daily.  30 tablet  11  . buPROPion (WELLBUTRIN XL) 150 MG 24 hr tablet Take 150 mg by mouth daily.        Marland Kitchen azaTHIOprine (IMURAN) 50 MG tablet Take 2 tablets per day       . clonazePAM (KLONOPIN) 0.5 MG tablet Take one by mouth as needed       . zolpidem (AMBIEN) 10 MG tablet Take 10 mg by mouth at bedtime as needed.  Review of Systems Review of Systems  Constitutional: Negative for fever, appetite change, fatigue and unexpected weight change.  Eyes: Negative for pain and visual disturbance.  Respiratory: Negative for cough and shortness of breath.   Cardiovascular: Negative for cp or palpitations    Gastrointestinal: Negative for nausea, diarrhea and constipation.  Genitourinary: Negative for urgency and frequency.  Skin: Negative for pallor or rash   MSK / ext- pos for very mild edema dependent Neurological: Negative for weakness, light-headedness, numbness and headaches.  Hematological: Negative for adenopathy. Does not bruise/bleed easily.  Psychiatric/Behavioral: Negative for dysphoric mood. The patient is not nervous/anxious.          Objective:   Physical Exam  Constitutional: She appears well-developed and well-nourished. No distress.  HENT:  Head: Normocephalic and atraumatic.  Mouth/Throat: Oropharynx is clear and moist.  Eyes: Conjunctivae and EOM are normal. Pupils are equal, round, and reactive  to light.  Neck: Normal range of motion. Neck supple. No JVD present. Carotid bruit is not present. No thyromegaly present.  Cardiovascular: Normal rate, regular rhythm, normal heart sounds and intact distal pulses.  Exam reveals no gallop.   Pulmonary/Chest: Effort normal and breath sounds normal. No respiratory distress. She has no wheezes.  Abdominal: Soft. Bowel sounds are normal. There is no tenderness.  Musculoskeletal: She exhibits no edema and no tenderness.  Lymphadenopathy:    She has no cervical adenopathy.  Neurological: She is alert. She has normal reflexes. Coordination normal.       No tremor   Skin: Skin is warm and dry. No rash noted. No erythema. No pallor.  Psychiatric: She has a normal mood and affect.       Cheerful- states she is getting married next mo          Assessment & Plan:

## 2011-08-04 NOTE — Patient Instructions (Signed)
Blood pressure is well controlled  Continue the amlodipine (norvasc)  Drink water / avoid salt -- work towards exercise 5 days per week  Follow up for PE in [place] as planned with labs prior please

## 2011-08-04 NOTE — Assessment & Plan Note (Signed)
Just a bit worse with amlodipine but tolerable Disc avoiding sodium/ elevating legs and also trying supp hose No edema on exam today

## 2011-08-04 NOTE — Assessment & Plan Note (Signed)
Sees psychiatry and doing well on current medicines

## 2011-08-04 NOTE — Assessment & Plan Note (Signed)
Improved with norvasc which she tolerates well  Will continue this Commended on wt loss with good diet  Made plan for exercise 5 d per week - walking and videos

## 2011-08-13 LAB — COMPREHENSIVE METABOLIC PANEL
BUN: 7 mg/dL (ref 6–23)
Calcium: 9 mg/dL (ref 8.4–10.5)
Glucose, Bld: 103 mg/dL — ABNORMAL HIGH (ref 70–99)
Total Protein: 7.4 g/dL (ref 6.0–8.3)

## 2011-08-13 LAB — DIFFERENTIAL
Lymphs Abs: 2 10*3/uL (ref 0.7–4.0)
Monocytes Relative: 10 % (ref 3–12)
Neutro Abs: 3.8 10*3/uL (ref 1.7–7.7)
Neutrophils Relative %: 58 % (ref 43–77)

## 2011-08-13 LAB — CBC
HCT: 37.8 % (ref 36.0–46.0)
MCV: 83.8 fL (ref 78.0–100.0)
Platelets: 453 10*3/uL — ABNORMAL HIGH (ref 150–400)
RDW: 13.9 % (ref 11.5–15.5)

## 2011-11-07 ENCOUNTER — Telehealth: Payer: Self-pay | Admitting: Family Medicine

## 2011-11-07 DIAGNOSIS — Z0000 Encounter for general adult medical examination without abnormal findings: Secondary | ICD-10-CM | POA: Insufficient documentation

## 2011-11-07 NOTE — Telephone Encounter (Signed)
Message copied by Abner Greenspan on Sun Nov 07, 2011 12:35 PM ------      Message from: Ellamae Sia      Created: Wed Nov 03, 2011 11:22 AM      Regarding: lab orders for 11-10-10       Patient is scheduled for CPX labs, please order future labs, Thanks , Karna Christmas

## 2011-11-08 ENCOUNTER — Other Ambulatory Visit: Payer: Self-pay | Admitting: Family Medicine

## 2011-11-08 DIAGNOSIS — Z0000 Encounter for general adult medical examination without abnormal findings: Secondary | ICD-10-CM

## 2011-11-11 ENCOUNTER — Other Ambulatory Visit (INDEPENDENT_AMBULATORY_CARE_PROVIDER_SITE_OTHER): Payer: BC Managed Care – PPO

## 2011-11-11 DIAGNOSIS — Z0000 Encounter for general adult medical examination without abnormal findings: Secondary | ICD-10-CM

## 2011-11-11 LAB — CBC WITH DIFFERENTIAL/PLATELET
Basophils Relative: 0.6 % (ref 0.0–3.0)
Eosinophils Relative: 0.6 % (ref 0.0–5.0)
HCT: 37.3 % (ref 36.0–46.0)
Lymphs Abs: 2.3 10*3/uL (ref 0.7–4.0)
MCV: 87.1 fl (ref 78.0–100.0)
Monocytes Absolute: 0.7 10*3/uL (ref 0.1–1.0)
Neutro Abs: 4 10*3/uL (ref 1.4–7.7)
Platelets: 370 10*3/uL (ref 150.0–400.0)
RBC: 4.28 Mil/uL (ref 3.87–5.11)
WBC: 7.1 10*3/uL (ref 4.5–10.5)

## 2011-11-11 LAB — COMPREHENSIVE METABOLIC PANEL
AST: 18 U/L (ref 0–37)
Albumin: 3.8 g/dL (ref 3.5–5.2)
Alkaline Phosphatase: 117 U/L (ref 39–117)
BUN: 11 mg/dL (ref 6–23)
Potassium: 3.9 mEq/L (ref 3.5–5.1)

## 2011-11-11 LAB — LIPID PANEL
HDL: 52.9 mg/dL (ref >39.00)
VLDL: 26.4 mg/dL (ref 0.0–40.0)

## 2011-11-16 ENCOUNTER — Encounter: Payer: Self-pay | Admitting: Family Medicine

## 2011-11-16 ENCOUNTER — Ambulatory Visit (INDEPENDENT_AMBULATORY_CARE_PROVIDER_SITE_OTHER): Payer: BC Managed Care – PPO | Admitting: Family Medicine

## 2011-11-16 VITALS — BP 130/90 | HR 92 | Temp 99.1°F | Ht 68.0 in | Wt 168.5 lb

## 2011-11-16 DIAGNOSIS — Z0000 Encounter for general adult medical examination without abnormal findings: Secondary | ICD-10-CM

## 2011-11-16 DIAGNOSIS — N6019 Diffuse cystic mastopathy of unspecified breast: Secondary | ICD-10-CM | POA: Insufficient documentation

## 2011-11-16 DIAGNOSIS — I10 Essential (primary) hypertension: Secondary | ICD-10-CM

## 2011-11-16 DIAGNOSIS — K5090 Crohn's disease, unspecified, without complications: Secondary | ICD-10-CM

## 2011-11-16 DIAGNOSIS — E785 Hyperlipidemia, unspecified: Secondary | ICD-10-CM | POA: Insufficient documentation

## 2011-11-16 DIAGNOSIS — Z1231 Encounter for screening mammogram for malignant neoplasm of breast: Secondary | ICD-10-CM | POA: Insufficient documentation

## 2011-11-16 DIAGNOSIS — Z23 Encounter for immunization: Secondary | ICD-10-CM

## 2011-11-16 NOTE — Assessment & Plan Note (Signed)
Scheduled annual screening mammogram Nl breast exam today -- though fibrocystic and dense  Encouraged monthly self exams

## 2011-11-16 NOTE — Patient Instructions (Signed)
For fibrocystic breasts - please avoid caffiene We will schedule mammogram at check out  Flu shot and Tdap shot today  Schedule fasting lab for 3 months for cholesterol and then follow up -- Avoid red meat/ fried foods/ egg yolks/ fatty breakfast meats/ butter, cheese and high fat dairy/ and shellfish  Cholesterol Control Diet Cholesterol levels in your body are determined significantly by your diet. Cholesterol levels may also be related to heart disease. The following material helps to explain this relationship and discusses what you can do to help keep your heart healthy. Not all cholesterol is bad. Low-density lipoprotein (LDL) cholesterol is the "bad" cholesterol. It may cause fatty deposits to build up inside your arteries. High-density lipoprotein (HDL) cholesterol is "good." It helps to remove the "bad" LDL cholesterol from your blood. Cholesterol is a very important risk factor for heart disease. Other risk factors are high blood pressure, smoking, stress, heredity, and weight. The heart muscle gets its supply of blood through the coronary arteries. If your LDL cholesterol is high and your HDL cholesterol is low, you are at risk for having fatty deposits build up in your coronary arteries. This leaves less room through which blood can flow. Without sufficient blood and oxygen, the heart muscle cannot function properly and you may feel chest pains (angina pectoris). When a coronary artery closes up entirely, a part of the heart muscle may die, causing a heart attack (myocardial infarction). CHECKING CHOLESTEROL When your caregiver sends your blood to a lab to be analyzed for cholesterol, a complete lipid (fat) profile may be done. With this test, the total amount of cholesterol and levels of LDL and HDL are determined. Triglycerides are a type of fat that circulates in the blood and can also be used to determine heart disease risk. The list below describes what the numbers should be: Test: Total  Cholesterol.  Less than 200 mg/dl.  Test: LDL "bad cholesterol."  Less than 100 mg/dl.     Less than 70 mg/dl if you are at very high risk of a heart attack or sudden cardiac death.  Test: HDL "good cholesterol."  Greater than 50 mg/dl for women.     Greater than 40 mg/dl for men.  Test: Triglycerides.  Less than 150 mg/dl.  CONTROLLING CHOLESTEROL WITH DIET Although exercise and lifestyle factors are important, your diet is key. That is because certain foods are known to raise cholesterol and others to lower it. The goal is to balance foods for their effect on cholesterol and more importantly, to replace saturated and trans fat with other types of fat, such as monounsaturated fat, polyunsaturated fat, and omega-3 fatty acids. On average, a person should consume no more than 15 to 17 g of saturated fat daily. Saturated and trans fats are considered "bad" fats, and they will raise LDL cholesterol. Saturated fats are primarily found in animal products such as meats, butter, and cream. However, that does not mean you need to sacrifice all your favorite foods. Today, there are good tasting, low-fat, low-cholesterol substitutes for most of the things you like to eat. Choose low-fat or nonfat alternatives. Choose round or loin cuts of red meat, since these types of cuts are lowest in fat and cholesterol. Chicken (without the skin), fish, veal, and ground [place] breast are excellent choices. Eliminate fatty meats, such as hot dogs and salami. Even shellfish have little or no saturated fat. Have a 3 oz (85 g) portion when you eat lean meat, poultry, or fish. Trans  fats are also called "partially hydrogenated oils." They are oils that have been scientifically manipulated so that they are solid at room temperature resulting in a longer shelf life and improved taste and texture of foods in which they are added. Trans fats are found in stick margarine, some tub margarines, cookies, crackers, and baked goods.    When baking and cooking, oils are an excellent substitute for butter. The monounsaturated oils are especially beneficial since it is believed they lower LDL and raise HDL. The oils you should avoid entirely are saturated tropical oils, such as coconut and palm.   Remember to eat liberally from food groups that are naturally free of saturated and trans fat, including fish, fruit, vegetables, beans, grains (barley, rice, couscous, bulgur wheat), and pasta (without cream sauces).   IDENTIFYING FOODS THAT LOWER CHOLESTEROL   Soluble fiber may lower your cholesterol. This type of fiber is found in fruits such as apples, vegetables such as broccoli, potatoes, and carrots, legumes such as beans, peas, and lentils, and grains such as barley. Foods fortified with plant sterols (phytosterol) may also lower cholesterol. You should eat at least 2 g per day of these foods for a cholesterol lowering effect.   Read package labels to identify low-saturated fats, trans fats free, and low-fat foods at the supermarket. Select cheeses that have only 2 to 3 g saturated fat per ounce. Use a heart-healthy tub margarine that is free of trans fats or partially hydrogenated oil. When buying baked goods (cookies, crackers), avoid partially hydrogenated oils. Breads and muffins should be made from whole grains (whole-wheat or whole oat flour, instead of "flour" or "enriched flour"). Buy non-creamy canned soups with reduced salt and no added fats.   FOOD PREPARATION TECHNIQUES   Never deep-fry. If you must fry, either stir-fry, which uses very little fat, or use non-stick cooking sprays. When possible, broil, bake, or roast meats, and steam vegetables. Instead of dressing vegetables with butter or margarine, use lemon and herbs, applesauce and cinnamon (for squash and sweet potatoes), nonfat yogurt, salsa, and low-fat dressings for salads.   LOW-SATURATED FAT / LOW-FAT FOOD SUBSTITUTES Meats / Saturated Fat (g)  Avoid: Steak,  marbled (3 oz/85 g) / 11 g     Choose: Steak, lean (3 oz/85 g) / 4 g     Avoid: Hamburger (3 oz/85 g) / 7 g     Choose: Hamburger, lean (3 oz/85 g) / 5 g     Avoid: Ham (3 oz/85 g) / 6 g     Choose: Ham, lean cut (3 oz/85 g) / 2.4 g     Avoid: Chicken, with skin, dark meat (3 oz/85 g) / 4 g     Choose: Chicken, skin removed, dark meat (3 oz/85 g) / 2 g     Avoid: Chicken, with skin, light meat (3 oz/85 g) / 2.5 g     Choose: Chicken, skin removed, light meat (3 oz/85 g) / 1 g  Dairy / Saturated Fat (g)  Avoid: Whole milk (1 cup) / 5 g     Choose: Low-fat milk, 2% (1 cup) / 3 g     Choose: Low-fat milk, 1% (1 cup) / 1.5 g     Choose: Skim milk (1 cup) / 0.3 g     Avoid: Hard cheese (1 oz/28 g) / 6 g     Choose: Skim milk cheese (1 oz/28 g) / 2 to 3 g     Avoid: Cottage cheese, 4% fat (  1 cup) / 6.5 g     Choose: Low-fat cottage cheese, 1% fat (1 cup) / 1.5 g     Avoid: Ice cream (1 cup) / 9 g     Choose: Sherbet (1 cup) / 2.5 g     Choose: Nonfat frozen yogurt (1 cup) / 0.3 g     Choose: Frozen fruit bar / trace     Avoid: Whipped cream (1 tbs) / 3.5 g     Choose: Nondairy whipped topping (1 tbs) / 1 g  Condiments / Saturated Fat (g)  Avoid: Mayonnaise (1 tbs) / 2 g     Choose: Low-fat mayonnaise (1 tbs) / 1 g     Avoid: Butter (1 tbs) / 7 g     Choose: Extra light margarine (1 tbs) / 1 g     Avoid: Coconut oil (1 tbs) / 11.8 g     Choose: Olive oil (1 tbs) / 1.8 g     Choose: Corn oil (1 tbs) / 1.7 g     Choose: Safflower oil (1 tbs) / 1.2 g     Choose: Sunflower oil (1 tbs) / 1.4 g     Choose: Soybean oil (1 tbs) / 2.4 g     Choose: Canola oil (1 tbs) / 1 g  Document Released: 10/25/2005 Document Revised: 07/07/2011 Document Reviewed: 04/15/2011 [facility] Patient Information 2012 [place], [place].Cholesterol Control Diet Cholesterol levels in your body are determined significantly by your diet. Cholesterol levels may also be related to heart  disease. The following material helps to explain this relationship and discusses what you can do to help keep your heart healthy. Not all cholesterol is bad. Low-density lipoprotein (LDL) cholesterol is the "bad" cholesterol. It may cause fatty deposits to build up inside your arteries. High-density lipoprotein (HDL) cholesterol is "good." It helps to remove the "bad" LDL cholesterol from your blood. Cholesterol is a very important risk factor for heart disease. Other risk factors are high blood pressure, smoking, stress, heredity, and weight. The heart muscle gets its supply of blood through the coronary arteries. If your LDL cholesterol is high and your HDL cholesterol is low, you are at risk for having fatty deposits build up in your coronary arteries. This leaves less room through which blood can flow. Without sufficient blood and oxygen, the heart muscle cannot function properly and you may feel chest pains (angina pectoris). When a coronary artery closes up entirely, a part of the heart muscle may die, causing a heart attack (myocardial infarction). CHECKING CHOLESTEROL When your caregiver sends your blood to a lab to be analyzed for cholesterol, a complete lipid (fat) profile may be done. With this test, the total amount of cholesterol and levels of LDL and HDL are determined. Triglycerides are a type of fat that circulates in the blood and can also be used to determine heart disease risk. The list below describes what the numbers should be: Test: Total Cholesterol.  Less than 200 mg/dl.  Test: LDL "bad cholesterol."  Less than 100 mg/dl.     Less than 70 mg/dl if you are at very high risk of a heart attack or sudden cardiac death.  Test: HDL "good cholesterol."  Greater than 50 mg/dl for women.     Greater than 40 mg/dl for men.  Test: Triglycerides.  Less than 150 mg/dl.  CONTROLLING CHOLESTEROL WITH DIET Although exercise and lifestyle factors are important, your diet is key. That is  because certain foods are known to  raise cholesterol and others to lower it. The goal is to balance foods for their effect on cholesterol and more importantly, to replace saturated and trans fat with other types of fat, such as monounsaturated fat, polyunsaturated fat, and omega-3 fatty acids. On average, a person should consume no more than 15 to 17 g of saturated fat daily. Saturated and trans fats are considered "bad" fats, and they will raise LDL cholesterol. Saturated fats are primarily found in animal products such as meats, butter, and cream. However, that does not mean you need to sacrifice all your favorite foods. Today, there are good tasting, low-fat, low-cholesterol substitutes for most of the things you like to eat. Choose low-fat or nonfat alternatives. Choose round or loin cuts of red meat, since these types of cuts are lowest in fat and cholesterol. Chicken (without the skin), fish, veal, and ground [place] breast are excellent choices. Eliminate fatty meats, such as hot dogs and salami. Even shellfish have little or no saturated fat. Have a 3 oz (85 g) portion when you eat lean meat, poultry, or fish. Trans fats are also called "partially hydrogenated oils." They are oils that have been scientifically manipulated so that they are solid at room temperature resulting in a longer shelf life and improved taste and texture of foods in which they are added. Trans fats are found in stick margarine, some tub margarines, cookies, crackers, and baked goods.   When baking and cooking, oils are an excellent substitute for butter. The monounsaturated oils are especially beneficial since it is believed they lower LDL and raise HDL. The oils you should avoid entirely are saturated tropical oils, such as coconut and palm.   Remember to eat liberally from food groups that are naturally free of saturated and trans fat, including fish, fruit, vegetables, beans, grains (barley, rice, couscous, bulgur wheat), and  pasta (without cream sauces).   IDENTIFYING FOODS THAT LOWER CHOLESTEROL   Soluble fiber may lower your cholesterol. This type of fiber is found in fruits such as apples, vegetables such as broccoli, potatoes, and carrots, legumes such as beans, peas, and lentils, and grains such as barley. Foods fortified with plant sterols (phytosterol) may also lower cholesterol. You should eat at least 2 g per day of these foods for a cholesterol lowering effect.   Read package labels to identify low-saturated fats, trans fats free, and low-fat foods at the supermarket. Select cheeses that have only 2 to 3 g saturated fat per ounce. Use a heart-healthy tub margarine that is free of trans fats or partially hydrogenated oil. When buying baked goods (cookies, crackers), avoid partially hydrogenated oils. Breads and muffins should be made from whole grains (whole-wheat or whole oat flour, instead of "flour" or "enriched flour"). Buy non-creamy canned soups with reduced salt and no added fats.   FOOD PREPARATION TECHNIQUES   Never deep-fry. If you must fry, either stir-fry, which uses very little fat, or use non-stick cooking sprays. When possible, broil, bake, or roast meats, and steam vegetables. Instead of dressing vegetables with butter or margarine, use lemon and herbs, applesauce and cinnamon (for squash and sweet potatoes), nonfat yogurt, salsa, and low-fat dressings for salads.   LOW-SATURATED FAT / LOW-FAT FOOD SUBSTITUTES Meats / Saturated Fat (g)  Avoid: Steak, marbled (3 oz/85 g) / 11 g     Choose: Steak, lean (3 oz/85 g) / 4 g     Avoid: Hamburger (3 oz/85 g) / 7 g     Choose: Hamburger, lean (3  oz/85 g) / 5 g     Avoid: Ham (3 oz/85 g) / 6 g     Choose: Ham, lean cut (3 oz/85 g) / 2.4 g     Avoid: Chicken, with skin, dark meat (3 oz/85 g) / 4 g     Choose: Chicken, skin removed, dark meat (3 oz/85 g) / 2 g     Avoid: Chicken, with skin, light meat (3 oz/85 g) / 2.5 g     Choose: Chicken, skin  removed, light meat (3 oz/85 g) / 1 g  Dairy / Saturated Fat (g)  Avoid: Whole milk (1 cup) / 5 g     Choose: Low-fat milk, 2% (1 cup) / 3 g     Choose: Low-fat milk, 1% (1 cup) / 1.5 g     Choose: Skim milk (1 cup) / 0.3 g     Avoid: Hard cheese (1 oz/28 g) / 6 g     Choose: Skim milk cheese (1 oz/28 g) / 2 to 3 g     Avoid: Cottage cheese, 4% fat (1 cup) / 6.5 g     Choose: Low-fat cottage cheese, 1% fat (1 cup) / 1.5 g     Avoid: Ice cream (1 cup) / 9 g     Choose: Sherbet (1 cup) / 2.5 g     Choose: Nonfat frozen yogurt (1 cup) / 0.3 g     Choose: Frozen fruit bar / trace     Avoid: Whipped cream (1 tbs) / 3.5 g     Choose: Nondairy whipped topping (1 tbs) / 1 g  Condiments / Saturated Fat (g)  Avoid: Mayonnaise (1 tbs) / 2 g     Choose: Low-fat mayonnaise (1 tbs) / 1 g     Avoid: Butter (1 tbs) / 7 g     Choose: Extra light margarine (1 tbs) / 1 g     Avoid: Coconut oil (1 tbs) / 11.8 g     Choose: Olive oil (1 tbs) / 1.8 g     Choose: Corn oil (1 tbs) / 1.7 g     Choose: Safflower oil (1 tbs) / 1.2 g     Choose: Sunflower oil (1 tbs) / 1.4 g     Choose: Soybean oil (1 tbs) / 2.4 g     Choose: Canola oil (1 tbs) / 1 g  Document Released: 10/25/2005 Document Revised: 07/07/2011 Document Reviewed: 04/15/2011 [facility] Patient Information 2012 [place], [place].

## 2011-11-16 NOTE — Assessment & Plan Note (Signed)
bp in fair control at this time  No changes needed  Disc lifstyle change with low sodium diet and exercise   Will  Continue norvasc- is tolerating that well

## 2011-11-16 NOTE — Assessment & Plan Note (Signed)
Per pt - doing very well after hemicolectomy Due for colonosc 2014 -pt aware  Also colon cancer in family

## 2011-11-16 NOTE — Assessment & Plan Note (Signed)
Reviewed health habits including diet and exercise and skin cancer prevention Also reviewed health mt list, fam hx and immunizations   Rev wellness labs  Disc plan for exercise  Flu and Tdap today  colonosc due 2014 Mam planned  Will do gyn/ pap at next health mt visit - no problems in past

## 2011-11-16 NOTE — Assessment & Plan Note (Signed)
Chol is quite high with LDL over 180 but good HDL Disc goals for lipids and reasons to control them Rev labs with pt Rev low sat fat diet in detail  Lab and f/u 3 mo - if not imp will disc statin

## 2011-11-16 NOTE — Progress Notes (Signed)
Subjective:    Patient ID: Veronica [name], female    DOB: [birth year], 51 y.o.   MRN: [id number]  HPI Here for health maintenance exam and to review chronic medical problems    bp is 130/90     Today Taking norvasc No cp or palpitations or headaches or edema  No side effects to medicines    Wt is stable with bmi of25  Cholesterol is high Lab Results  Component Value Date   CHOL 267* 11/11/2011   Lab Results  Component Value Date   HDL 52.90 11/11/2011   No results found for this basename: [facility]   Lab Results  Component Value Date   TRIG 132.0 11/11/2011   Lab Results  Component Value Date   CHOLHDL 5 11/11/2011   Lab Results  Component Value Date   LDLDIRECT 184.6 11/11/2011  never had it checked before Mother has high chol - even with great diet  Diet- is fair - but not perfect- some fried foods/ dairy / pork/ red meat  Exercise- terrible -- needs to start something  ? Does not have a plan yet   Flu shot- not yet -- will get today   Due Tdap-- will get today   colonosc 12/09 Chron's Mother had colon cancer  She herself had hemicolectomy  Gyn care/ pap- ? Less than 2 years ago  No abn paps  No new sexual partners  Is spotting today  Wants to wait until next year  No issues - still regular peroids  No need for OC   mammo Never had mammo  Needs to schedule  Has tender breasts generally - esp before menses No lumps on self exam   Patient Active Problem List  Diagnoses  . ANXIETY DEPRESSION  . ESSENTIAL HYPERTENSION  . RGN ENTERITIS SMALL INTESTINE W/LG INTESTINE  . CROHN'S DISEASE  . ACNE, MILD  . EDEMA  . ABDOMINAL PAIN-GENERALIZED  . ABDOMINAL MASS, RIGHT LOWER QUADRANT  . TRANSAMINASES, SERUM, ELEVATED  . OTHER ABNORMAL BLOOD CHEMISTRY  . NONSPECIFIC ABNORMAL RESULTS LIVR FUNCTION STUDY  . Bruising  . Routine general medical examination at a health care facility  . Hyperlipidemia  . Other screening mammogram  . Fibrocystic breast   Past  Medical History  Diagnosis Date  . PMS (premenstrual syndrome)   . Depression   . Hypertension   . Edema   . Colonic mass 1/10  . Acne    Past Surgical History  Procedure Date  . Hemicolectomy 1/10   History  Substance Use Topics  . Smoking status: Never Smoker   . Smokeless tobacco: Not on file  . Alcohol Use: No   Family History  Problem Relation Age of Onset  . Colon cancer Mother   . Alcohol abuse Brother   . Hypertension Paternal Grandfather   . Diabetes Paternal Grandfather   . Coronary artery disease Paternal Grandfather   . Coronary artery disease Maternal Grandfather   . Aneurysm Paternal Grandmother     cerebral  . Hypertension Paternal Grandmother   . Pulmonary embolism Father   . Aneurysm Father     cerebral  . Alcohol abuse Son   . Drug abuse Son   . Depression Other     fam hx   Allergies  Allergen Reactions  . Hydrochlorothiazide     Dizzy and felt faint  . Fluoxetine Hcl     REACTION: urticartia  . Tenormin (Atenolol)     fatigue  . Venlafaxine  REACTION: weight gain, makes sleepy  . Bupropion Hcl     REACTION: tinnitus previously but no other symptoms.  She tolerated it on second trial w/o return of tinnitus.     Current Outpatient Prescriptions on File Prior to Visit  Medication Sig Dispense Refill  . amLODipine (NORVASC) 5 MG tablet Take 1 tablet (5 mg total) by mouth daily.  30 tablet  11  . buPROPion (WELLBUTRIN XL) 150 MG 24 hr tablet Take 150 mg by mouth daily.        . clonazePAM (KLONOPIN) 0.5 MG tablet Take one by mouth as needed       . azaTHIOprine (IMURAN) 50 MG tablet Take 2 tablets per day       . zolpidem (AMBIEN) 10 MG tablet Take 10 mg by mouth at bedtime as needed.            Review of Systems Review of Systems  Constitutional: Negative for fever, appetite change, fatigue and unexpected weight change.  Eyes: Negative for pain and visual disturbance.  Respiratory: Negative for cough and shortness of breath.     Cardiovascular: Negative for cp or palpitations    Gastrointestinal: Negative for nausea, diarrhea and constipation.  Genitourinary: Negative for urgency and frequency. breasts do stay tender before menses  Skin: Negative for pallor or rash   Neurological: Negative for weakness, light-headedness, numbness and headaches.  Hematological: Negative for adenopathy. Does not bruise/bleed easily.  Psychiatric/Behavioral: Negative for dysphoric mood. Anxiety is in fair control          Objective:   Physical Exam  Constitutional: She appears well-developed and well-nourished. No distress.  HENT:  Head: Normocephalic and atraumatic.  Right Ear: External ear normal.  Left Ear: External ear normal.  Nose: Nose normal.  Mouth/Throat: Oropharynx is clear and moist.  Eyes: Conjunctivae and EOM are normal. Pupils are equal, round, and reactive to light. No scleral icterus.  Neck: Normal range of motion. Neck supple. No JVD present. Carotid bruit is not present. Erythema present. No thyromegaly present.  Cardiovascular: Normal rate, regular rhythm, normal heart sounds and intact distal pulses.  Exam reveals no gallop.   Pulmonary/Chest: Effort normal and breath sounds normal. No respiratory distress. She has no wheezes. She exhibits no tenderness.  Abdominal: Soft. Bowel sounds are normal. She exhibits no distension, no abdominal bruit and no mass. There is no tenderness.  Genitourinary: There is breast tenderness. No breast swelling, discharge or bleeding.       Breast exam: No mass, nodules, thickening, bulging, retraction, inflamation, nipple discharge or skin changes noted.  No axillary or clavicular LA.  Generally tender breasts (fibrocystic) and very dense   Chaperoned exam.    Musculoskeletal: Normal range of motion. She exhibits no edema and no tenderness.  Lymphadenopathy:    She has no cervical adenopathy.  Neurological: She is alert. She has normal reflexes. No cranial nerve deficit. She  exhibits normal muscle tone. Coordination normal.  Skin: Skin is warm and dry. No rash noted. No erythema. No pallor.  Psychiatric: She has a normal mood and affect.          Assessment & Plan:

## 2011-11-16 NOTE — Assessment & Plan Note (Signed)
Disc avoidance of caffeine and imp of regular breast exams

## 2011-11-22 ENCOUNTER — Ambulatory Visit: Payer: BC Managed Care – PPO | Admitting: Family Medicine

## 2011-12-02 ENCOUNTER — Ambulatory Visit
Admission: RE | Admit: 2011-12-02 | Discharge: 2011-12-02 | Disposition: A | Discharge status: Home / Self Care | Payer: BC Managed Care – PPO | Source: Ambulatory Visit | Attending: Family Medicine | Admitting: Family Medicine

## 2011-12-02 DIAGNOSIS — Z1231 Encounter for screening mammogram for malignant neoplasm of breast: Secondary | ICD-10-CM

## 2011-12-03 ENCOUNTER — Other Ambulatory Visit: Payer: Self-pay | Admitting: Family Medicine

## 2011-12-03 DIAGNOSIS — R928 Other abnormal and inconclusive findings on diagnostic imaging of breast: Secondary | ICD-10-CM

## 2011-12-23 ENCOUNTER — Ambulatory Visit
Admission: RE | Admit: 2011-12-23 | Discharge: 2011-12-23 | Disposition: A | Discharge status: Home / Self Care | Payer: BC Managed Care – PPO | Source: Ambulatory Visit | Attending: Family Medicine | Admitting: Family Medicine

## 2011-12-23 ENCOUNTER — Ambulatory Visit: Payer: BC Managed Care – PPO

## 2011-12-23 DIAGNOSIS — R928 Other abnormal and inconclusive findings on diagnostic imaging of breast: Secondary | ICD-10-CM

## 2011-12-27 ENCOUNTER — Other Ambulatory Visit: Payer: BC Managed Care – PPO

## 2012-01-24 ENCOUNTER — Telehealth: Payer: Self-pay | Admitting: *Deleted

## 2012-01-24 NOTE — Telephone Encounter (Signed)
Patient notified as instructed by telephone. Pt said blister like places are on both sides of her face. Pt scheduled appt with Dr Damita Dunnings 01/25/12 at 8:30am. Pt said if condition worsened tonight she would go to UC.

## 2012-01-24 NOTE — Telephone Encounter (Signed)
Patient called stating that she has been off of her Amlodipine for about two days now due to the fact that she has been noticing water like pimples on her face.  She stated that she never had a problem with pimples or bumps even as a child this wasn't an issue.  She stated that her face has been breaking out and burning and seems to be gradually getting worse.  She stated that her face was burning so bad last night that she started crying.  She has changed makeup, soap and lotion and she thinks it is the medication.  Please advise.

## 2012-01-24 NOTE — Telephone Encounter (Signed)
If on one side, need to be concerned about shingles possibly Please schedule appt with [facility] or [facility]  I am out of the office

## 2012-01-25 ENCOUNTER — Encounter: Payer: Self-pay | Admitting: Family Medicine

## 2012-01-25 ENCOUNTER — Ambulatory Visit (INDEPENDENT_AMBULATORY_CARE_PROVIDER_SITE_OTHER): Payer: BC Managed Care – PPO | Admitting: Family Medicine

## 2012-01-25 VITALS — BP 138/86 | HR 92 | Temp 98.6°F | Wt 170.2 lb

## 2012-01-25 DIAGNOSIS — R21 Rash and other nonspecific skin eruption: Secondary | ICD-10-CM

## 2012-01-25 MED ORDER — METRONIDAZOLE 1 % EX GEL: Freq: Every day | CUTANEOUS | Status: DC

## 2012-01-25 NOTE — Assessment & Plan Note (Signed)
Rosacea vs amlodipine reaction, lupus less likely.  Will try metronidazole and she'll f/u with PMD if not improved.  I think rosacea is likely.

## 2012-01-25 NOTE — Progress Notes (Signed)
Rash.  Started a few months ago.  Worse recently, worse at the end of the day.  It burns.  No difference with change in makeup/soap.  No rash other than the face, bilaterally.  No sig history of acne.  It will occ blister, but no whiteheads.  No FCNAVD.  She's had some knee and elbow pain with prolonged sitting.  No weight loss.  H/o crohn's, controlled.   Meds, vitals, and allergies reviewed.   ROS: See HPI.  Otherwise, noncontributory.  nad ncat Tm wnl Nasal exam unremarkable Op wnl, no oral lesions Neck supple rrr ctab B facial rash on cheeks and forehead.  Blanching erythema with small red papules and telangiectatic vessels.

## 2012-01-25 NOTE — Patient Instructions (Signed)
I think you have rosacea.  Use metrogel daily and follow up with Dr. Glori Bickers if not improved.

## 2012-02-11 ENCOUNTER — Other Ambulatory Visit (INDEPENDENT_AMBULATORY_CARE_PROVIDER_SITE_OTHER): Payer: BC Managed Care – PPO

## 2012-02-11 DIAGNOSIS — E785 Hyperlipidemia, unspecified: Secondary | ICD-10-CM

## 2012-02-11 LAB — LIPID PANEL
Cholesterol: 262 mg/dL — ABNORMAL HIGH (ref 0–200)
HDL: 47.6 mg/dL (ref >39.00)
VLDL: 32 mg/dL (ref 0.0–40.0)

## 2012-02-11 LAB — LDL CHOLESTEROL, DIRECT: Direct LDL: 190.6 mg/dL

## 2012-02-16 ENCOUNTER — Ambulatory Visit (INDEPENDENT_AMBULATORY_CARE_PROVIDER_SITE_OTHER): Payer: BC Managed Care – PPO | Admitting: Family Medicine

## 2012-02-16 ENCOUNTER — Encounter: Payer: Self-pay | Admitting: Family Medicine

## 2012-02-16 VITALS — BP 110/78 | HR 87 | Temp 98.1°F | Ht 68.0 in | Wt 167.2 lb

## 2012-02-16 DIAGNOSIS — F341 Dysthymic disorder: Secondary | ICD-10-CM

## 2012-02-16 DIAGNOSIS — E785 Hyperlipidemia, unspecified: Secondary | ICD-10-CM

## 2012-02-16 DIAGNOSIS — I10 Essential (primary) hypertension: Secondary | ICD-10-CM

## 2012-02-16 NOTE — Assessment & Plan Note (Signed)
bp in fair control at this time  No changes needed  Disc lifstyle change with low sodium diet and exercise   

## 2012-02-16 NOTE — Assessment & Plan Note (Signed)
Despite optimal diet- chol remains quite high - and pt aware this is genetic (mother has it too) She is not yet ready to discuss statin med - which would be next step Will think about it- disc pros and cons Will let me know when/if she wants to try it  Disc goals for lipids and reasons to control them Rev labs with pt Rev low sat fat diet in detail

## 2012-02-16 NOTE — Patient Instructions (Addendum)
Keep watching diet the best you can Avoid red meat/ fried foods/ egg yolks/ fatty breakfast meats/ butter, cheese and high fat dairy/ and shellfish  If or when you are ready to try statin medicine for cholesterol - let me know  Also try to get some regular exercise  Talk to your psychiatrist about the changes in your mood- that may be hormonal -make an appt  If you want to see a counselor let me know  Also make sure to keep up follow ups with GI

## 2012-02-16 NOTE — Assessment & Plan Note (Signed)
Worsened symptoms of irritability of mood - suspect much related to perimenopause and hormone changes  Will disc with her psychiatrist Unfortunately does not tolerate SSRIs Offered counseling and pt declined

## 2012-02-16 NOTE — Progress Notes (Signed)
Subjective:    Patient ID: Veronica [name], female    DOB: 09/19/1961, 51 y.o.   MRN: [id number]  HPI Here for high cholesterol  Last visit disc working on diet - and re assessing   Lab Results  Component Value Date   CHOL 262* 02/11/2012   CHOL 267* 11/11/2011   Lab Results  Component Value Date   HDL 47.60 02/11/2012   HDL 52.90 11/11/2011   No results found for this basename: [facility]   Lab Results  Component Value Date   TRIG 160.0* 02/11/2012   TRIG 132.0 11/11/2011   Lab Results  Component Value Date   CHOLHDL 6 02/11/2012   CHOLHDL 5 11/11/2011   Lab Results  Component Value Date   LDLDIRECT 190.6 02/11/2012   LDLDIRECT 184.6 11/11/2011    She is not ready for statin or other med at this time Knows she needs to take something for high chol with good diet Mother has the same  Will think about it and get back to me   bp is 110/78    Today No cp or palpitations or headaches or edema  No side effects to medicines    Having mood changes from menopause change  Also very tired and not motivated  Sees Investment banker, corporate   Patient Active Problem List  Diagnoses  . ANXIETY DEPRESSION  . ESSENTIAL HYPERTENSION  . RGN ENTERITIS SMALL INTESTINE W/LG INTESTINE  . CROHN'S DISEASE  . ACNE, MILD  . EDEMA  . ABDOMINAL PAIN-GENERALIZED  . ABDOMINAL MASS, RIGHT LOWER QUADRANT  . TRANSAMINASES, SERUM, ELEVATED  . OTHER ABNORMAL BLOOD CHEMISTRY  . NONSPECIFIC ABNORMAL RESULTS LIVR FUNCTION STUDY  . Bruising  . Routine general medical examination at a health care facility  . Hyperlipidemia  . Other screening mammogram  . Fibrocystic breast  . Rash   Past Medical History  Diagnosis Date  . PMS (premenstrual syndrome)   . Depression   . Hypertension   . Edema   . Colonic mass 1/10  . Acne   . Crohn's disease   . Hyperlipidemia    Past Surgical History  Procedure Date  . Hemicolectomy 1/10   History  Substance Use Topics  . Smoking status: Never Smoker   . Smokeless  tobacco: Never Used  . Alcohol Use: No   Family History  Problem Relation Age of Onset  . Colon cancer Mother   . Alcohol abuse Brother   . Hypertension Paternal Grandfather   . Diabetes Paternal Grandfather   . Coronary artery disease Paternal Grandfather   . Coronary artery disease Maternal Grandfather   . Aneurysm Paternal Grandmother     cerebral  . Hypertension Paternal Grandmother   . Pulmonary embolism Father   . Aneurysm Father     cerebral  . Alcohol abuse Son   . Drug abuse Son   . Depression Other     fam hx   Allergies  Allergen Reactions  . Hydrochlorothiazide     Dizzy and felt faint  . Fluoxetine Hcl     REACTION: urticartia  . Tenormin (Atenolol)     fatigue  . Venlafaxine     REACTION: weight gain, makes sleepy  . Bupropion Hcl     REACTION: tinnitus previously but no other symptoms.  She tolerated it on second trial w/o return of tinnitus.     Current Outpatient Prescriptions on File Prior to Visit  Medication Sig Dispense Refill  . amLODipine (NORVASC) 5 MG tablet Take  1 tablet (5 mg total) by mouth daily.  30 tablet  11  . buPROPion (WELLBUTRIN XL) 150 MG 24 hr tablet Take 150 mg by mouth daily.        . clonazePAM (KLONOPIN) 0.5 MG tablet Take one by mouth as needed       . metroNIDAZOLE (METROGEL) 1 % gel Apply topically daily.  45 g  0  . DISCONTD: zolpidem (AMBIEN) 10 MG tablet Take 10 mg by mouth at bedtime as needed.          Review of Systems Review of Systems  Constitutional: Negative for fever, appetite change, fatigue and unexpected weight change.  Eyes: Negative for pain and visual disturbance.  Respiratory: Negative for cough and shortness of breath.   Cardiovascular: Negative for cp or palpitations    Gastrointestinal: Negative for nausea, diarrhea and constipation.  Genitourinary: Negative for urgency and frequency.  Skin: Negative for pallor or rash   Neurological: Negative for weakness, light-headedness, numbness and headaches.    Hematological: Negative for adenopathy. Does not bruise/bleed easily.  Psychiatric/Behavioral: pos for anx and depressed symptoms, neg for SI         Objective:   Physical Exam  Constitutional: She appears well-developed and well-nourished. No distress.  HENT:  Head: Normocephalic and atraumatic.  Mouth/Throat: Oropharynx is clear and moist.  Eyes: Conjunctivae and EOM are normal. Pupils are equal, round, and reactive to light. No scleral icterus.  Neck: Normal range of motion. Neck supple. No JVD present. Carotid bruit is not present. No thyromegaly present.  Cardiovascular: Normal rate, regular rhythm, normal heart sounds and intact distal pulses.  Exam reveals no gallop.   Pulmonary/Chest: Effort normal and breath sounds normal. No respiratory distress. She has no wheezes.  Abdominal: Soft. Bowel sounds are normal. She exhibits no distension, no abdominal bruit and no mass. There is no tenderness.  Musculoskeletal: She exhibits no edema and no tenderness.  Lymphadenopathy:    She has no cervical adenopathy.  Neurological: She is alert. She has normal reflexes. No cranial nerve deficit. She exhibits normal muscle tone. Coordination normal.  Skin: Skin is warm and dry. No rash noted. No erythema. No pallor.  Psychiatric: Her behavior is normal. Judgment and thought content normal.       Overall talkative and attentive, but slt anxious  Is not tearful today           Assessment & Plan:

## 2012-07-05 ENCOUNTER — Telehealth: Payer: Self-pay | Admitting: Family Medicine

## 2012-07-05 NOTE — Telephone Encounter (Signed)
Pt is on b/p meds and amlodapine??? She thinks she is having a reaction to these meds ??? She's no sure. She just came off Wellbutrin and going on Zoloft she's thinks this may be it as well. She says her B/P is high and she is having palpitations.

## 2012-07-05 NOTE — Telephone Encounter (Signed)
Veronica [name] states she has had increased systolic Blood Pressue of 150.  Does not remember diastolic.  Has had headaches in AM. Intermittent tightness in chest and palpitations. None at present. States she has this with anxiety.  Swelling in feet and ankles.  A few nosebleeds within last 2 weeks. Has been been on gluten free diet x 2 months.Sees a Engineer, mining for Anxiety and panic attacks  who checked Veronica [name] Blood pressure and recommended she notify  provider.  Started Zoloft 5 weeks ago. (Per hypertension diagnosed has see provider within 24 hrs due to sudden elevation in Blood Pressure and recently changed medications.)Appt scheduled for 11:15 on 07/06/12 in [facility]. Care advice given.

## 2012-07-05 NOTE — Telephone Encounter (Signed)
I will defer to Dr. Glori Bickers.

## 2012-07-05 NOTE — Telephone Encounter (Signed)
This could all be anxiety related - but keep appt just to check things out  If symptoms become severe after hours (for example cp or sob)-go to ER

## 2012-07-06 ENCOUNTER — Encounter: Payer: Self-pay | Admitting: Family Medicine

## 2012-07-06 ENCOUNTER — Ambulatory Visit (INDEPENDENT_AMBULATORY_CARE_PROVIDER_SITE_OTHER): Payer: BC Managed Care – PPO | Admitting: Family Medicine

## 2012-07-06 VITALS — BP 124/92 | HR 87 | Temp 99.1°F | Wt 163.0 lb

## 2012-07-06 DIAGNOSIS — I10 Essential (primary) hypertension: Secondary | ICD-10-CM

## 2012-07-06 LAB — BASIC METABOLIC PANEL
CO2: 25 mEq/L (ref 19–32)
Chloride: 103 mEq/L (ref 96–112)
Creatinine, Ser: 0.8 mg/dL (ref 0.4–1.2)
Potassium: 3.3 mEq/L — ABNORMAL LOW (ref 3.5–5.1)

## 2012-07-06 NOTE — Progress Notes (Signed)
Last few months she had more anxiety, chest pressure.  Was seen Veronica [name] in [place] with psych clinic.  Off wellbutrin now and started on zoloft 5 weeks ago.  BP was up yesterday at psych clinic.  No CP now but she is still anxious and feels some chest pressure (which she attributes to anxiety).    She has a lot of salt in diet. Discussed.  Her daughter moved out to go to college.    We talked about checking BP at home.  See plan.    Meds, vitals, and allergies reviewed.   ROS: See HPI.  Otherwise, noncontributory.  GEN: nad, alert and oriented, mildly tearful but regains composure HEENT: mucous membranes moist NECK: supple w/o LA CV: rrr. PULM: ctab, no inc wob ABD: soft, +bs EXT: no edema SKIN: no acute rash

## 2012-07-06 NOTE — Patient Instructions (Addendum)
Go to the lab on the way out.  We'll contact you with your lab report. Don't change your meds for now.  Avoid salt.  Check your pressure in the AM.  Urinate, sit down for few minutes when you aren't rushed.  Then check your pressure.  If consistently >140/>90, then notify us.   Take care.

## 2012-07-07 ENCOUNTER — Encounter: Payer: Self-pay | Admitting: *Deleted

## 2012-07-07 NOTE — Telephone Encounter (Signed)
Patient notified and advised.

## 2012-07-07 NOTE — Assessment & Plan Note (Signed)
Reasonable BP today.  Check BP at home and she'll f/u with psych about anxiety.  Avoid salt, see notes on labs.

## 2012-07-18 ENCOUNTER — Other Ambulatory Visit: Payer: Self-pay | Admitting: Family Medicine

## 2012-07-18 NOTE — Telephone Encounter (Signed)
Rx Amlodipine 5 mg #30 R10 sent in to [facility]

## 2013-03-16 ENCOUNTER — Other Ambulatory Visit: Payer: Self-pay | Admitting: *Deleted

## 2013-03-16 MED ORDER — AMLODIPINE BESYLATE 5 MG PO TABS: ORAL_TABLET | ORAL | Status: DC

## 2013-10-18 ENCOUNTER — Other Ambulatory Visit: Payer: Self-pay | Admitting: Family Medicine

## 2013-10-29 ENCOUNTER — Encounter: Payer: Self-pay | Admitting: Family Medicine

## 2013-10-29 ENCOUNTER — Ambulatory Visit (INDEPENDENT_AMBULATORY_CARE_PROVIDER_SITE_OTHER): Payer: BC Managed Care – PPO | Admitting: Family Medicine

## 2013-10-29 VITALS — BP 144/88 | HR 97 | Temp 98.4°F | Ht 68.0 in | Wt 188.5 lb

## 2013-10-29 DIAGNOSIS — Z23 Encounter for immunization: Secondary | ICD-10-CM

## 2013-10-29 DIAGNOSIS — R609 Edema, unspecified: Secondary | ICD-10-CM

## 2013-10-29 DIAGNOSIS — I10 Essential (primary) hypertension: Secondary | ICD-10-CM

## 2013-10-29 LAB — CBC WITH DIFFERENTIAL/PLATELET
Basophils Absolute: 0 10*3/uL (ref 0.0–0.1)
Basophils Relative: 0.4 % (ref 0.0–3.0)
Eosinophils Absolute: 0.1 10*3/uL (ref 0.0–0.7)
Eosinophils Relative: 0.7 % (ref 0.0–5.0)
HCT: 36.9 % (ref 36.0–46.0)
Hemoglobin: 12.1 g/dL (ref 12.0–15.0)
Lymphocytes Relative: 23 % (ref 12.0–46.0)
Lymphs Abs: 1.8 10*3/uL (ref 0.7–4.0)
MCHC: 32.9 g/dL (ref 30.0–36.0)
MCV: 81.8 fl (ref 78.0–100.0)
Monocytes Absolute: 0.6 10*3/uL (ref 0.1–1.0)
Monocytes Relative: 7.3 % (ref 3.0–12.0)
Neutro Abs: 5.3 10*3/uL (ref 1.4–7.7)
Neutrophils Relative %: 68.6 % (ref 43.0–77.0)
Platelets: 438 10*3/uL — ABNORMAL HIGH (ref 150.0–400.0)
RBC: 4.51 Mil/uL (ref 3.87–5.11)
RDW: 14.9 % — ABNORMAL HIGH (ref 11.5–14.6)
WBC: 7.8 10*3/uL (ref 4.5–10.5)

## 2013-10-29 LAB — COMPREHENSIVE METABOLIC PANEL
Albumin: 3.7 g/dL (ref 3.5–5.2)
Alkaline Phosphatase: 116 U/L (ref 39–117)
BUN: 9 mg/dL (ref 6–23)
CO2: 30 mEq/L (ref 19–32)
Calcium: 9 mg/dL (ref 8.4–10.5)
GFR: 90.82 mL/min (ref >60.00)
Glucose, Bld: 97 mg/dL (ref 70–99)
Potassium: 3.9 mEq/L (ref 3.5–5.1)

## 2013-10-29 LAB — LIPID PANEL
Total CHOL/HDL Ratio: 7
Triglycerides: 240 mg/dL — ABNORMAL HIGH (ref 0.0–149.0)
VLDL: 48 mg/dL — ABNORMAL HIGH (ref 0.0–40.0)

## 2013-10-29 LAB — TSH: TSH: 0.95 u[IU]/mL (ref 0.35–5.50)

## 2013-10-29 LAB — LDL CHOLESTEROL, DIRECT: Direct LDL: 264.6 mg/dL

## 2013-10-29 MED ORDER — LISINOPRIL 10 MG PO TABS: 10.0000 mg | ORAL_TABLET | Freq: Every day | ORAL | Status: DC

## 2013-10-29 NOTE — Progress Notes (Signed)
Subjective:    Patient ID: Veronica [name], female    DOB: 04-21-1961, 52 y.o.   MRN: [id number]  HPI Here for HTN   bp was high at her eye doctor's office last week  Noted also her vision in [facility] is worse than usual- and he wanted her checked for DM No blood clots or glaucoma  No cataracts   BP Readings from Last 3 Encounters:  10/29/13 144/88  07/06/12 124/92  02/16/12 110/78    On amlodipine 5 mg   Wt is up 25 lb - working and sitting too much and no time to exercise or take care of herself   No headaches/other symptoms   Working long hours  Does not like her job  Patient Active Problem List   Diagnosis Date Noted  . Rash 01/25/2012  . Hyperlipidemia 11/16/2011  . Other screening mammogram 11/16/2011  . Fibrocystic breast 11/16/2011  . Routine general medical examination at a health care facility 11/07/2011  . Bruising 05/07/2011  . 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 CROHN'S DISEASE 11/25/2008  . ABDOMINAL MASS, RIGHT LOWER QUADRANT 10/16/2008  . ABDOMINAL PAIN-GENERALIZED 09/25/2008  . NONSPECIFIC ABNORMAL RESULTS LIVR FUNCTION STUDY 09/25/2008  . ESSENTIAL HYPERTENSION 08/22/2008  . TRANSAMINASES, SERUM, ELEVATED 08/22/2008  . OTHER ABNORMAL BLOOD CHEMISTRY 08/01/2008  . ANXIETY DEPRESSION 06/27/2008  . ACNE, MILD 06/27/2008  . EDEMA 06/27/2008   Past Medical History  Diagnosis Date  . PMS (premenstrual syndrome)   . Depression   . Hypertension   . Edema   . Colonic mass 1/10  . Acne   . Crohn's disease   . Hyperlipidemia    Past Surgical History  Procedure Laterality Date  . Hemicolectomy  1/10   History  Substance Use Topics  . Smoking status: Never Smoker   . Smokeless tobacco: Never Used  . Alcohol Use: No   Family History  Problem Relation Age of Onset  . Colon cancer Mother   . Alcohol abuse Brother   . Hypertension Paternal Grandfather   . Diabetes Paternal Grandfather   . Coronary artery disease  Paternal Grandfather   . Coronary artery disease Maternal Grandfather   . Aneurysm Paternal Grandmother     cerebral  . Hypertension Paternal Grandmother   . Pulmonary embolism Father   . Aneurysm Father     cerebral  . Alcohol abuse Son   . Drug abuse Son   . Depression Other     fam hx   Allergies  Allergen Reactions  . Hydrochlorothiazide     Dizzy and felt faint  . Fluoxetine Hcl     REACTION: urticartia  . Tenormin [Atenolol]     fatigue  . Venlafaxine     REACTION: weight gain, makes sleepy  . Bupropion Hcl     REACTION: tinnitus previously but no other symptoms.  She tolerated it on second trial w/o return of tinnitus.     Current Outpatient Prescriptions on File Prior to Visit  Medication Sig Dispense Refill  . amLODipine (NORVASC) 5 MG tablet TAKE 1 TABLET BY MOUTH DAILY  90 tablet  0  . clonazePAM (KLONOPIN) 0.5 MG tablet Take one by mouth as needed       . sertraline (ZOLOFT) 50 MG tablet Take 25 mg by mouth daily.       . [DISCONTINUED] zolpidem (AMBIEN) 10 MG tablet Take 10 mg by mouth at bedtime as needed.         No  current facility-administered medications on file prior to visit.    Review of Systems Review of Systems  Constitutional: Negative for fever, appetite change,  and unexpected weight change. pos for fatigue  Eyes: Negative for pain and visual disturbance.  Respiratory: Negative for cough and shortness of breath.   Cardiovascular: Negative for cp or palpitations   pos for ankle edema on amlodipine  Gastrointestinal: Negative for nausea, diarrhea and constipation.  Genitourinary: Negative for urgency and frequency.  Skin: Negative for pallor or rash   Neurological: Negative for weakness, light-headedness, numbness and headaches.  Hematological: Negative for adenopathy. Does not bruise/bleed easily.  Psychiatric/Behavioral: Negative for dysphoric mood. The patient is not nervous/anxious.  pos for stressors        Objective:   Physical Exam    Constitutional: She appears well-developed and well-nourished. No distress.  overwt and well app  HENT:  Head: Normocephalic and atraumatic.  Right Ear: External ear normal.  Left Ear: External ear normal.  Nose: Nose normal.  Mouth/Throat: Oropharynx is clear and moist.  Eyes: Conjunctivae and EOM are normal. Pupils are equal, round, and reactive to light. Right eye exhibits no discharge. Left eye exhibits no discharge. No scleral icterus.  Neck: Normal range of motion. Neck supple. No JVD present. No thyromegaly present.  Cardiovascular: Normal rate, regular rhythm, normal heart sounds and intact distal pulses.  Exam reveals no gallop.   Pulmonary/Chest: Effort normal and breath sounds normal. No respiratory distress. She has no wheezes. She has no rales.  Abdominal: Soft. Bowel sounds are normal. She exhibits no distension and no mass. There is no tenderness.  Musculoskeletal: She exhibits no edema and no tenderness.  Lymphadenopathy:    She has no cervical adenopathy.  Neurological: She is alert. She has normal reflexes. No cranial nerve deficit. She exhibits normal muscle tone. Coordination normal.  Skin: Skin is warm and dry. No rash noted. No erythema. No pallor.  Psychiatric: She has a normal mood and affect.  Pt seems stressed and tired Pleasant           Assessment & Plan:

## 2013-10-29 NOTE — Assessment & Plan Note (Signed)
Pt thinks this is from amlodipine None today  Adding ace- hope this will help  F/u planned Lab today

## 2013-10-29 NOTE — Progress Notes (Signed)
Pre-visit discussion using our clinic review tool. No additional management support is needed unless otherwise documented below in the visit note.  

## 2013-10-29 NOTE — Patient Instructions (Signed)
Work on [employer] and exercise  Add lisinopril 10 mg daily  Continue amlodipine Labs today Follow up in 2-4 weeks

## 2013-10-29 NOTE — Assessment & Plan Note (Signed)
bp is up with wt gain and also stressors/ time for self care Continue amlodipine and add lisinopril to help bp and also minimize edema Lab today  F/u planned Disc about diet / exercise / dash diet  today

## 2013-10-30 ENCOUNTER — Encounter: Payer: Self-pay | Admitting: *Deleted

## 2013-12-04 ENCOUNTER — Encounter: Payer: Self-pay | Admitting: Family Medicine

## 2013-12-04 ENCOUNTER — Ambulatory Visit (INDEPENDENT_AMBULATORY_CARE_PROVIDER_SITE_OTHER): Payer: BC Managed Care – PPO | Admitting: Family Medicine

## 2013-12-04 VITALS — BP 130/90 | HR 112 | Temp 97.9°F | Wt 187.2 lb

## 2013-12-04 DIAGNOSIS — I10 Essential (primary) hypertension: Secondary | ICD-10-CM

## 2013-12-04 MED ORDER — LOSARTAN POTASSIUM 50 MG PO TABS: 50.0000 mg | ORAL_TABLET | Freq: Every day | ORAL | Status: DC

## 2013-12-04 NOTE — Assessment & Plan Note (Signed)
Pt intol of lisinopril but it helped with bp and swelling  Caused cough Will replace with losartan 50 mg daily  F/u May as planned and watch bp  Earlier if needed Will call if side eff or problems

## 2013-12-04 NOTE — Patient Instructions (Signed)
Stop lisinopril and change to losartan If cough returns let me know  Take care of yourself with healthy diet and exercise   DASH Diet The DASH diet stands for "Dietary Approaches to Stop Hypertension." It is a healthy eating plan that has been shown to reduce high blood pressure (hypertension) in as little as 14 days, while also possibly providing other significant health benefits. These other health benefits include reducing the risk of breast cancer after menopause and reducing the risk of type 2 diabetes, heart disease, colon cancer, and stroke. Health benefits also include weight loss and slowing kidney failure in patients with chronic kidney disease.  DIET GUIDELINES  Limit salt (sodium). Your diet should contain less than 1500 mg of sodium daily.  Limit refined or processed carbohydrates. Your diet should include mostly whole grains. Desserts and added sugars should be used sparingly.  Include small amounts of heart-healthy fats. These types of fats include nuts, oils, and tub margarine. Limit saturated and trans fats. These fats have been shown to be harmful in the body. CHOOSING FOODS  The following food groups are based on a 2000 calorie diet. See your Registered Dietitian for individual calorie needs. Grains and Grain Products (6 to 8 servings daily)  Eat More Often: Whole-wheat bread, brown rice, whole-grain or wheat pasta, quinoa, popcorn without added fat or salt (air popped).  Eat Less Often: White bread, white pasta, white rice, cornbread. Vegetables (4 to 5 servings daily)  Eat More Often: Fresh, frozen, and canned vegetables. Vegetables may be raw, steamed, roasted, or grilled with a minimal amount of fat.  Eat Less Often/Avoid: Creamed or fried vegetables. Vegetables in a cheese sauce. Fruit (4 to 5 servings daily)  Eat More Often: All fresh, canned (in natural juice), or frozen fruits. Dried fruits without added sugar. One hundred percent fruit juice ( cup [237 mL]  daily).  Eat Less Often: Dried fruits with added sugar. Canned fruit in light or heavy syrup. [employer], Fish, and Poultry (2 servings or less daily. One serving is 3 to 4 oz [85-114 g]).  Eat More Often: Ninety percent or leaner ground beef, tenderloin, sirloin. Round cuts of beef, chicken breast, [place] breast. All fish. Grill, bake, or broil your meat. Nothing should be fried.  Eat Less Often/Avoid: Fatty cuts of meat, [place], or chicken leg, thigh, or wing. Fried cuts of meat or fish. Dairy (2 to 3 servings)  Eat More Often: Low-fat or fat-free milk, low-fat plain or light yogurt, reduced-fat or part-skim cheese.  Eat Less Often/Avoid: Milk (whole, 2%).Whole milk yogurt. Full-fat cheeses. Nuts, Seeds, and Legumes (4 to 5 servings per week)  Eat More Often: All without added salt.  Eat Less Often/Avoid: Salted nuts and seeds, canned beans with added salt. Fats and Sweets (limited)  Eat More Often: Vegetable oils, tub margarines without trans fats, sugar-free gelatin. Mayonnaise and salad dressings.  Eat Less Often/Avoid: Coconut oils, palm oils, butter, stick margarine, cream, half and half, cookies, candy, pie. FOR MORE INFORMATION The Dash Diet Eating Plan: www.dashdiet.org Document Released: 10/14/2011 Document Revised: 01/17/2012 Document Reviewed: 10/14/2011 [facility] Patient Information 2014 [place], [place].

## 2013-12-04 NOTE — Progress Notes (Signed)
Pre-visit discussion using our clinic review tool. No additional management support is needed unless otherwise documented below in the visit note.  

## 2013-12-04 NOTE — Progress Notes (Signed)
Subjective:    Patient ID: Veronica [name], female    DOB: [birth year], 53 y.o.   MRN: [id number]  HPI Here for f/u of HTN   bp is stable today  No cp or palpitations or headaches or edema  No side effects to medicines  BP Readings from Last 3 Encounters:  12/04/13 130/90  10/29/13 144/88  07/06/12 124/92     Has coughed since starting the lisinopril   (was sick at [facility] but never stopped nagging cough) She did not take med yest and today   On both meds - bp was good / swelling went away in ankles and she felt generally better  She is getting a treadmill to start an exercise program also   Patient Active Problem List   Diagnosis Date Noted  . Rash 01/25/2012  . Hyperlipidemia 11/16/2011  . Other screening mammogram 11/16/2011  . Fibrocystic breast 11/16/2011  . Routine general medical examination at a health care facility 11/07/2011  . Bruising 05/07/2011  . 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 CROHN'S DISEASE 11/25/2008  . ABDOMINAL MASS, RIGHT LOWER QUADRANT 10/16/2008  . ABDOMINAL PAIN-GENERALIZED 09/25/2008  . NONSPECIFIC ABNORMAL RESULTS LIVR FUNCTION STUDY 09/25/2008  . ESSENTIAL HYPERTENSION 08/22/2008  . TRANSAMINASES, SERUM, ELEVATED 08/22/2008  . OTHER ABNORMAL BLOOD CHEMISTRY 08/01/2008  . ANXIETY DEPRESSION 06/27/2008  . ACNE, MILD 06/27/2008  . EDEMA 06/27/2008   Past Medical History  Diagnosis Date  . PMS (premenstrual syndrome)   . Depression   . Hypertension   . Edema   . Colonic mass 1/10  . Acne   . Crohn's disease   . Hyperlipidemia    Past Surgical History  Procedure Laterality Date  . Hemicolectomy  1/10   History  Substance Use Topics  . Smoking status: Never Smoker   . Smokeless tobacco: Never Used  . Alcohol Use: No   Family History  Problem Relation Age of Onset  . Colon cancer Mother   . Alcohol abuse Brother   . Hypertension Paternal Grandfather   . Diabetes Paternal Grandfather   . Coronary artery  disease Paternal Grandfather   . Coronary artery disease Maternal Grandfather   . Aneurysm Paternal Grandmother     cerebral  . Hypertension Paternal Grandmother   . Pulmonary embolism Father   . Aneurysm Father     cerebral  . Alcohol abuse Son   . Drug abuse Son   . Depression Other     fam hx   Allergies  Allergen Reactions  . Hydrochlorothiazide     Dizzy and felt faint  . Fluoxetine Hcl     REACTION: urticartia  . Tenormin [Atenolol]     fatigue  . Venlafaxine     REACTION: weight gain, makes sleepy  . Bupropion Hcl     REACTION: tinnitus previously but no other symptoms.  She tolerated it on second trial w/o return of tinnitus.     Current Outpatient Prescriptions on File Prior to Visit  Medication Sig Dispense Refill  . amLODipine (NORVASC) 5 MG tablet TAKE 1 TABLET BY MOUTH DAILY  90 tablet  0  . clonazePAM (KLONOPIN) 0.5 MG tablet Take one by mouth as needed       . sertraline (ZOLOFT) 50 MG tablet Take 25 mg by mouth daily.       Marland Kitchen lisinopril (PRINIVIL,ZESTRIL) 10 MG tablet Take 1 tablet (10 mg total) by mouth daily.  30 tablet  5  . [DISCONTINUED] zolpidem (AMBIEN) 10  MG tablet Take 10 mg by mouth at bedtime as needed.         No current facility-administered medications on file prior to visit.      Review of Systems    Review of Systems  Constitutional: Negative for fever, appetite change, fatigue and unexpected weight change.  Eyes: Negative for pain and visual disturbance.  Respiratory: Negative for wheeze  and shortness of breath.  pos for dry cough Cardiovascular: Negative for cp or palpitations    Gastrointestinal: Negative for nausea, diarrhea and constipation.  Genitourinary: Negative for urgency and frequency.  Skin: Negative for pallor or rash   Neurological: Negative for weakness, light-headedness, numbness and headaches.  Hematological: Negative for adenopathy. Does not bruise/bleed easily.  Psychiatric/Behavioral: Negative for dysphoric mood.  The patient is not nervous/anxious.      Objective:   Physical Exam  Constitutional: She appears well-developed and well-nourished. No distress.  overwt and well app  HENT:  Head: Normocephalic and atraumatic.  Nose: Nose normal.  Mouth/Throat: Oropharynx is clear and moist.  Eyes: Conjunctivae and EOM are normal. Pupils are equal, round, and reactive to light.  Neck: Normal range of motion. Neck supple. No JVD present. Carotid bruit is not present. No tracheal deviation present. No thyromegaly present.  Cardiovascular: Normal rate and regular rhythm.  Exam reveals no gallop.   Pulmonary/Chest: Effort normal and breath sounds normal. No respiratory distress. She has no wheezes. She has no rales.  No crackles   Musculoskeletal: She exhibits no edema.  Lymphadenopathy:    She has no cervical adenopathy.  Skin: Skin is warm and dry. No rash noted. No pallor.  Psychiatric: She has a normal mood and affect.          Assessment & Plan:

## 2013-12-13 ENCOUNTER — Telehealth: Payer: Self-pay | Admitting: Family Medicine

## 2013-12-13 NOTE — Telephone Encounter (Signed)
Relevant patient education assigned to patient using Emmi. ° °

## 2013-12-19 ENCOUNTER — Telehealth: Payer: Self-pay

## 2013-12-19 ENCOUNTER — Other Ambulatory Visit: Payer: Self-pay | Admitting: Family Medicine

## 2013-12-19 MED ORDER — SPIRONOLACTONE 25 MG PO TABS: 12.5000 mg | ORAL_TABLET | Freq: Every day | ORAL | Status: DC

## 2013-12-19 NOTE — Telephone Encounter (Signed)
Pt left v/m; pt said since taking losartan has developed a bad cough; pt decided today to stop losartan and request different med sent to [facility]. Pt request cb.

## 2013-12-19 NOTE — Telephone Encounter (Signed)
I want to try a small dose of spironolactone-this is a fluid pill but different than the one she was on before  Let me know if any problems/ how well you tolerate it

## 2013-12-20 NOTE — Telephone Encounter (Signed)
Left voicemail letting pt know new Rx was sent to pharmacy and to let us know if she has any problems

## 2014-02-28 ENCOUNTER — Telehealth: Payer: Self-pay | Admitting: Family Medicine

## 2014-02-28 DIAGNOSIS — Z0000 Encounter for general adult medical examination without abnormal findings: Secondary | ICD-10-CM

## 2014-02-28 NOTE — Telephone Encounter (Signed)
Message copied by Abner Greenspan on Thu Feb 28, 2014  5:57 PM ------      Message from: Ellamae Sia      Created: Wed Feb 20, 2014  6:26 PM      Regarding: Lab orders for Friday,4.24.15       Patient is scheduled for CPX labs, please order future labs, Thanks , Terri       ------

## 2014-03-01 ENCOUNTER — Other Ambulatory Visit (INDEPENDENT_AMBULATORY_CARE_PROVIDER_SITE_OTHER): Payer: BC Managed Care – PPO

## 2014-03-01 DIAGNOSIS — Z0000 Encounter for general adult medical examination without abnormal findings: Secondary | ICD-10-CM

## 2014-03-01 LAB — CBC WITH DIFFERENTIAL/PLATELET
Basophils Absolute: 0 10*3/uL (ref 0.0–0.1)
Basophils Relative: 0.6 % (ref 0.0–3.0)
EOS ABS: 0.1 10*3/uL (ref 0.0–0.7)
Eosinophils Relative: 1.6 % (ref 0.0–5.0)
HCT: 37.6 % (ref 36.0–46.0)
Hemoglobin: 12.4 g/dL (ref 12.0–15.0)
LYMPHS PCT: 32.3 % (ref 12.0–46.0)
Lymphs Abs: 2.3 10*3/uL (ref 0.7–4.0)
MCHC: 33 g/dL (ref 30.0–36.0)
MCV: 83.2 fl (ref 78.0–100.0)
MONO ABS: 0.6 10*3/uL (ref 0.1–1.0)
Monocytes Relative: 8.8 % (ref 3.0–12.0)
NEUTROS PCT: 56.7 % (ref 43.0–77.0)
Neutro Abs: 4 10*3/uL (ref 1.4–7.7)
PLATELETS: 426 10*3/uL — AB (ref 150.0–400.0)
RBC: 4.53 Mil/uL (ref 3.87–5.11)
RDW: 14.7 % — ABNORMAL HIGH (ref 11.5–14.6)
WBC: 7.1 10*3/uL (ref 4.5–10.5)

## 2014-03-01 LAB — LIPID PANEL
Cholesterol: 337 mg/dL — ABNORMAL HIGH (ref 0–200)
HDL: 41 mg/dL (ref >39.00)
LDL Cholesterol: 243 mg/dL — ABNORMAL HIGH (ref 0–99)
TRIGLYCERIDES: 266 mg/dL — AB (ref 0.0–149.0)
Total CHOL/HDL Ratio: 8
VLDL: 53.2 mg/dL — ABNORMAL HIGH (ref 0.0–40.0)

## 2014-03-01 LAB — COMPREHENSIVE METABOLIC PANEL
ALK PHOS: 135 U/L — AB (ref 39–117)
ALT: 21 U/L (ref 0–35)
AST: 23 U/L (ref 0–37)
Albumin: 3.8 g/dL (ref 3.5–5.2)
BUN: 10 mg/dL (ref 6–23)
CO2: 25 mEq/L (ref 19–32)
Calcium: 9.6 mg/dL (ref 8.4–10.5)
Chloride: 103 mEq/L (ref 96–112)
Creatinine, Ser: 0.8 mg/dL (ref 0.4–1.2)
GFR: 81.49 mL/min (ref >60.00)
Glucose, Bld: 90 mg/dL (ref 70–99)
Potassium: 3.8 mEq/L (ref 3.5–5.1)
SODIUM: 137 meq/L (ref 135–145)
Total Bilirubin: 0.3 mg/dL (ref 0.3–1.2)
Total Protein: 7.4 g/dL (ref 6.0–8.3)

## 2014-03-01 LAB — TSH: TSH: 2.05 u[IU]/mL (ref 0.35–5.50)

## 2014-03-08 ENCOUNTER — Ambulatory Visit (INDEPENDENT_AMBULATORY_CARE_PROVIDER_SITE_OTHER): Payer: BC Managed Care – PPO | Admitting: Family Medicine

## 2014-03-08 ENCOUNTER — Encounter: Payer: Self-pay | Admitting: Family Medicine

## 2014-03-08 VITALS — BP 148/90 | HR 102 | Temp 98.7°F | Ht 68.25 in | Wt 187.2 lb

## 2014-03-08 DIAGNOSIS — I10 Essential (primary) hypertension: Secondary | ICD-10-CM

## 2014-03-08 DIAGNOSIS — E785 Hyperlipidemia, unspecified: Secondary | ICD-10-CM

## 2014-03-08 DIAGNOSIS — Z0000 Encounter for general adult medical examination without abnormal findings: Secondary | ICD-10-CM

## 2014-03-08 DIAGNOSIS — K5090 Crohn's disease, unspecified, without complications: Secondary | ICD-10-CM

## 2014-03-08 MED ORDER — LOSARTAN POTASSIUM 50 MG PO TABS: 50.0000 mg | ORAL_TABLET | Freq: Every day | ORAL | Status: DC

## 2014-03-08 MED ORDER — SPIRONOLACTONE 25 MG PO TABS: 12.5000 mg | ORAL_TABLET | Freq: Every day | ORAL | Status: DC

## 2014-03-08 NOTE — Progress Notes (Signed)
Pre visit review using our clinic review tool, if applicable. No additional management support is needed unless otherwise documented below in the visit note. 

## 2014-03-08 NOTE — Progress Notes (Signed)
Subjective:    Patient ID: Veronica [name], female    DOB: 1960-12-17, 53 y.o.   MRN: [id number]  HPI Here for health maintenance exam and to review chronic medical problems    Wt is stable   Doing fairly overall  Has a treadmill and she is getting started with that   Pap 1/10 Has not had one  Has never had an abn pap Declines it today   Mammogram 2/13-[place] [facility] [place]-no lumps or changes   Flu 12/14   Td 1/13    colonosc 09 Hx of hemicolectomy 2010  Has not had a colonosc since  Having some discomfort - ( colitis crohn's) Thinks she needs a referral Dr Amedeo Plenty   She has quite a bit of swelling still in her L ankle - better if she elevates it  No pain   bp is up a bit today -- staying around 623 systolic / too much caff/ occ ha  No cp or palpitations  or edema  No side effects to medicines  BP Readings from Last 3 Encounters:  03/08/14 148/90  12/04/13 130/90  10/29/13 144/88       Chemistry      Component Value Date/Time   NA 137 03/01/2014 0758   K 3.8 03/01/2014 0758   CL 103 03/01/2014 0758   CO2 25 03/01/2014 0758   BUN 10 03/01/2014 0758   CREATININE 0.8 03/01/2014 0758   CREATININE 0.86 05/07/2011 1708      Component Value Date/Time   CALCIUM 9.6 03/01/2014 0758   ALKPHOS 135* 03/01/2014 0758   AST 23 03/01/2014 0758   ALT 21 03/01/2014 0758   BILITOT 0.3 03/01/2014 0758       Chol Lab Results  Component Value Date   CHOL 337* 03/01/2014   CHOL 333* 10/29/2013   CHOL 262* 02/11/2012   Lab Results  Component Value Date   HDL 41.00 03/01/2014   HDL 47.20 10/29/2013   HDL 47.60 02/11/2012   Lab Results  Component Value Date   LDLCALC 243* 03/01/2014   Lab Results  Component Value Date   TRIG 266.0* 03/01/2014   TRIG 240.0* 10/29/2013   TRIG 160.0* 02/11/2012   Lab Results  Component Value Date   CHOLHDL 8 03/01/2014   CHOLHDL 7 10/29/2013   CHOLHDL 6 02/11/2012   Lab Results  Component Value Date   LDLDIRECT 264.6 10/29/2013   LDLDIRECT 190.6 02/11/2012   LDLDIRECT 184.6 11/11/2011   pt is not open to medications    Patient Active Problem List   Diagnosis Date Noted  . Rash 01/25/2012  . Hyperlipidemia 11/16/2011  . Other screening mammogram 11/16/2011  . Fibrocystic breast 11/16/2011  . Routine general medical examination at a health care facility 11/07/2011  . Bruising 05/07/2011  . 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 CROHN'S DISEASE 11/25/2008  . ABDOMINAL MASS, RIGHT LOWER QUADRANT 10/16/2008  . ABDOMINAL PAIN-GENERALIZED 09/25/2008  . NONSPECIFIC ABNORMAL RESULTS LIVR FUNCTION STUDY 09/25/2008  . ESSENTIAL HYPERTENSION 08/22/2008  . TRANSAMINASES, SERUM, ELEVATED 08/22/2008  . OTHER ABNORMAL BLOOD CHEMISTRY 08/01/2008  . ANXIETY DEPRESSION 06/27/2008  . ACNE, MILD 06/27/2008  . EDEMA 06/27/2008   Past Medical History  Diagnosis Date  . PMS (premenstrual syndrome)   . Depression   . Hypertension   . Edema   . Colonic mass 1/10  . Acne   . Crohn's disease   . Hyperlipidemia    Past  Surgical History  Procedure Laterality Date  . Hemicolectomy  1/10   History  Substance Use Topics  . Smoking status: Never Smoker   . Smokeless tobacco: Never Used  . Alcohol Use: No   Family History  Problem Relation Age of Onset  . Colon cancer Mother   . Alcohol abuse Brother   . Hypertension Paternal Grandfather   . Diabetes Paternal Grandfather   . Coronary artery disease Paternal Grandfather   . Coronary artery disease Maternal Grandfather   . Aneurysm Paternal Grandmother     cerebral  . Hypertension Paternal Grandmother   . Pulmonary embolism Father   . Aneurysm Father     cerebral  . Alcohol abuse Son   . Drug abuse Son   . Depression Other     fam hx   Allergies  Allergen Reactions  . Hydrochlorothiazide     Dizzy and felt faint  . Ace Inhibitors     cough  . Fluoxetine Hcl     REACTION: urticartia  . Losartan     Cough   .  Tenormin [Atenolol]     fatigue  . Venlafaxine     REACTION: weight gain, makes sleepy  . Bupropion Hcl     REACTION: tinnitus previously but no other symptoms.  She tolerated it on second trial w/o return of tinnitus.     Current Outpatient Prescriptions on File Prior to Visit  Medication Sig Dispense Refill  . amLODipine (NORVASC) 5 MG tablet TAKE 1 TABLET BY MOUTH DAILY  90 tablet  0  . clonazePAM (KLONOPIN) 0.5 MG tablet Take one by mouth as needed       . sertraline (ZOLOFT) 50 MG tablet Take 25 mg by mouth daily.       Marland Kitchen spironolactone (ALDACTONE) 25 MG tablet Take 0.5 tablets (12.5 mg total) by mouth daily.  30 tablet  5  . [DISCONTINUED] zolpidem (AMBIEN) 10 MG tablet Take 10 mg by mouth at bedtime as needed.         No current facility-administered medications on file prior to visit.      Review of Systems Review of Systems  Constitutional: Negative for fever, appetite change, fatigue and unexpected weight change.  Eyes: Negative for pain and visual disturbance.  Respiratory: Negative for cough and shortness of breath.   Cardiovascular: Negative for cp or palpitations    Gastrointestinal: Negative for nausea, diarrhea and constipation.  Genitourinary: Negative for urgency and frequency.  Skin: Negative for pallor or rash   Neurological: Negative for weakness, light-headedness, numbness and headaches.  Hematological: Negative for adenopathy. Does not bruise/bleed easily.  Psychiatric/Behavioral: Negative for dysphoric mood. The patient is not nervous/anxious.  overall mood is stable on current medicines        Objective:   Physical Exam  Constitutional: She appears well-developed and well-nourished. No distress.  HENT:  Head: Normocephalic and atraumatic.  Right Ear: External ear normal.  Left Ear: External ear normal.  Mouth/Throat: Oropharynx is clear and moist.  Eyes: Conjunctivae and EOM are normal. Pupils are equal, round, and reactive to light. No scleral  icterus.  Neck: Normal range of motion. Neck supple. No JVD present. Carotid bruit is not present. No thyromegaly present.  Cardiovascular: Normal rate, regular rhythm, normal heart sounds and intact distal pulses.  Exam reveals no gallop.   Pulmonary/Chest: Effort normal and breath sounds normal. No respiratory distress. She has no wheezes. She exhibits no tenderness.  Abdominal: Soft. Bowel sounds are normal. She exhibits  no distension, no abdominal bruit and no mass. There is no tenderness.  Genitourinary: No breast swelling, tenderness, discharge or bleeding.  Breast exam: No mass, nodules, thickening, tenderness, bulging, retraction, inflamation, nipple discharge or skin changes noted.  No axillary or clavicular LA.      Musculoskeletal: Normal range of motion. She exhibits no edema and no tenderness.  Lymphadenopathy:    She has no cervical adenopathy.  Neurological: She is alert. She has normal reflexes. No cranial nerve deficit. She exhibits normal muscle tone. Coordination normal.  Skin: Skin is warm and dry. No rash noted. No erythema. No pallor.  Psychiatric: She has a normal mood and affect.  Good mood, pleasant           Assessment & Plan:

## 2014-03-08 NOTE — Patient Instructions (Signed)
Stop amlodipine  Start on losartan- if cough is intolerable call and let me know  Continue other medicines  Stop up front for referral to DR Amedeo Plenty for GI  Keep working on low cholesterol diet    Fat and Cholesterol Control Diet Fat and cholesterol levels in your blood and organs are influenced by your diet. High levels of fat and cholesterol may lead to diseases of the heart, small and large blood vessels, gallbladder, liver, and pancreas. CONTROLLING FAT AND CHOLESTEROL WITH DIET Although exercise and lifestyle factors are important, your diet is key. That is because certain foods are known to raise cholesterol and others to lower it. The goal is to balance foods for their effect on cholesterol and more importantly, to replace saturated and trans fat with other types of fat, such as monounsaturated fat, polyunsaturated fat, and omega-3 fatty acids. On average, a person should consume no more than 15 to 17 g of saturated fat daily. Saturated and trans fats are considered "bad" fats, and they will raise LDL cholesterol. Saturated fats are primarily found in animal products such as meats, butter, and cream. However, that does not mean you need to give up all your favorite foods. Today, there are good tasting, low-fat, low-cholesterol substitutes for most of the things you like to eat. Choose low-fat or nonfat alternatives. Choose round or loin cuts of red meat. These types of cuts are lowest in fat and cholesterol. Chicken (without the skin), fish, veal, and ground [place] breast are great choices. Eliminate fatty meats, such as hot dogs and salami. Even shellfish have little or no saturated fat. Have a 3 oz (85 g) portion when you eat lean meat, poultry, or fish. Trans fats are also called "partially hydrogenated oils." They are oils that have been scientifically manipulated so that they are solid at room temperature resulting in a longer shelf life and improved taste and texture of foods in which they  are added. Trans fats are found in stick margarine, some tub margarines, cookies, crackers, and baked goods.  When baking and cooking, oils are a great substitute for butter. The monounsaturated oils are especially beneficial since it is believed they lower LDL and raise HDL. The oils you should avoid entirely are saturated tropical oils, such as coconut and palm.  Remember to eat a lot from food groups that are naturally free of saturated and trans fat, including fish, fruit, vegetables, beans, grains (barley, rice, couscous, bulgur wheat), and pasta (without cream sauces).  IDENTIFYING FOODS THAT LOWER FAT AND CHOLESTEROL  Soluble fiber may lower your cholesterol. This type of fiber is found in fruits such as apples, vegetables such as broccoli, potatoes, and carrots, legumes such as beans, peas, and lentils, and grains such as barley. Foods fortified with plant sterols (phytosterol) may also lower cholesterol. You should eat at least 2 g per day of these foods for a cholesterol lowering effect.  Read package labels to identify low-saturated fats, trans fat free, and low-fat foods at the supermarket. Select cheeses that have only 2 to 3 g saturated fat per ounce. Use a heart-healthy tub margarine that is free of trans fats or partially hydrogenated oil. When buying baked goods (cookies, crackers), avoid partially hydrogenated oils. Breads and muffins should be made from whole grains (whole-wheat or whole oat flour, instead of "flour" or "enriched flour"). Buy non-creamy canned soups with reduced salt and no added fats.  FOOD PREPARATION TECHNIQUES  Never deep-fry. If you must fry, either stir-fry, which uses  very little fat, or use non-stick cooking sprays. When possible, broil, bake, or roast meats, and steam vegetables. Instead of putting butter or margarine on vegetables, use lemon and herbs, applesauce, and cinnamon (for squash and sweet potatoes). Use nonfat yogurt, salsa, and low-fat dressings for  salads.  LOW-SATURATED FAT / LOW-FAT FOOD SUBSTITUTES Meats / Saturated Fat (g)  Avoid: Steak, marbled (3 oz/85 g) / 11 g  Choose: Steak, lean (3 oz/85 g) / 4 g  Avoid: Hamburger (3 oz/85 g) / 7 g  Choose: Hamburger, lean (3 oz/85 g) / 5 g  Avoid: Ham (3 oz/85 g) / 6 g  Choose: Ham, lean cut (3 oz/85 g) / 2.4 g  Avoid: Chicken, with skin, dark meat (3 oz/85 g) / 4 g  Choose: Chicken, skin removed, dark meat (3 oz/85 g) / 2 g  Avoid: Chicken, with skin, light meat (3 oz/85 g) / 2.5 g  Choose: Chicken, skin removed, light meat (3 oz/85 g) / 1 g Dairy / Saturated Fat (g)  Avoid: Whole milk (1 cup) / 5 g  Choose: Low-fat milk, 2% (1 cup) / 3 g  Choose: Low-fat milk, 1% (1 cup) / 1.5 g  Choose: Skim milk (1 cup) / 0.3 g  Avoid: Hard cheese (1 oz/28 g) / 6 g  Choose: Skim milk cheese (1 oz/28 g) / 2 to 3 g  Avoid: Cottage cheese, 4% fat (1 cup) / 6.5 g  Choose: Low-fat cottage cheese, 1% fat (1 cup) / 1.5 g  Avoid: Ice cream (1 cup) / 9 g  Choose: Sherbet (1 cup) / 2.5 g  Choose: Nonfat frozen yogurt (1 cup) / 0.3 g  Choose: Frozen fruit bar / trace  Avoid: Whipped cream (1 tbs) / 3.5 g  Choose: Nondairy whipped topping (1 tbs) / 1 g Condiments / Saturated Fat (g)  Avoid: Mayonnaise (1 tbs) / 2 g  Choose: Low-fat mayonnaise (1 tbs) / 1 g  Avoid: Butter (1 tbs) / 7 g  Choose: Extra light margarine (1 tbs) / 1 g  Avoid: Coconut oil (1 tbs) / 11.8 g  Choose: Olive oil (1 tbs) / 1.8 g  Choose: Corn oil (1 tbs) / 1.7 g  Choose: Safflower oil (1 tbs) / 1.2 g  Choose: Sunflower oil (1 tbs) / 1.4 g  Choose: Soybean oil (1 tbs) / 2.4 g  Choose: Canola oil (1 tbs) / 1 g Document Released: 10/25/2005 Document Revised: 02/19/2013 Document Reviewed: 04/15/2011 [employer] Patient Information 2014 [place], [place].

## 2014-03-10 NOTE — Assessment & Plan Note (Signed)
Ref to Dr Amedeo Plenty for f/u and poss colonosc

## 2014-03-10 NOTE — Assessment & Plan Note (Signed)
Reviewed health habits including diet and exercise and skin cancer prevention Reviewed appropriate screening tests for age  Also reviewed health mt list, fam hx and immunization status , as well as social and family history   See HPI Lab rev

## 2014-03-10 NOTE — Assessment & Plan Note (Signed)
bp up a bit  Pt wants to try losartan today and see if cough is tolerable - to avoid edema from the amlodipine and see if bp goes down Rev DASH diet Enc use of treadmill  F/u planned

## 2014-03-10 NOTE — Assessment & Plan Note (Signed)
Disc goals for lipids and reasons to control them Rev labs with pt Rev low sat fat diet in detail Will work on better diet -handout given

## 2014-03-11 ENCOUNTER — Telehealth: Payer: Self-pay | Admitting: Family Medicine

## 2014-03-11 NOTE — Telephone Encounter (Signed)
Relevant patient education assigned to patient using Emmi. ° °

## 2014-04-08 ENCOUNTER — Ambulatory Visit: Payer: BC Managed Care – PPO | Admitting: Family Medicine

## 2014-04-12 ENCOUNTER — Ambulatory Visit (INDEPENDENT_AMBULATORY_CARE_PROVIDER_SITE_OTHER): Payer: BC Managed Care – PPO | Admitting: Family Medicine

## 2014-04-12 ENCOUNTER — Encounter: Payer: Self-pay | Admitting: Family Medicine

## 2014-04-12 VITALS — BP 128/70 | HR 97 | Temp 98.0°F | Ht 68.25 in | Wt 192.5 lb

## 2014-04-12 DIAGNOSIS — I10 Essential (primary) hypertension: Secondary | ICD-10-CM

## 2014-04-12 NOTE — Assessment & Plan Note (Signed)
bp improved with losartan BP: 128/70 mmHg   No side effects  Disc diet/exercise and goal of wt loss  F/u 3 mo

## 2014-04-12 NOTE — Patient Instructions (Signed)
Keep working on healthy exercise and diet and weight loss  Take care of yourself  Blood pressure is much improved  Follow up in 3 months

## 2014-04-12 NOTE — Progress Notes (Signed)
Pre visit review using our clinic review tool, if applicable. No additional management support is needed unless otherwise documented below in the visit note. 

## 2014-04-12 NOTE — Progress Notes (Signed)
Subjective:    Patient ID: Eugenio Hoes Forse, female    DOB: [birth year], 53 y.o.   MRN: [id number]  HPI Here for f/u of HTN   Is on losartan - no cough - expected to have a cough so she is thrilled   bp is stable today  No cp or palpitations or headaches or edema  No side effects to medicines  BP Readings from Last 3 Encounters:  04/12/14 128/70  03/08/14 148/90  12/04/13 130/90     Doing well  Working on lower sodium diet and using treadmill- using her fitbit also  Wants to work on weight loss     Chemistry      Component Value Date/Time   NA 137 03/01/2014 0758   K 3.8 03/01/2014 0758   CL 103 03/01/2014 0758   CO2 25 03/01/2014 0758   BUN 10 03/01/2014 0758   CREATININE 0.8 03/01/2014 0758   CREATININE 0.86 05/07/2011 1708      Component Value Date/Time   CALCIUM 9.6 03/01/2014 0758   ALKPHOS 135* 03/01/2014 0758   AST 23 03/01/2014 0758   ALT 21 03/01/2014 0758   BILITOT 0.3 03/01/2014 0758      Patient Active Problem List   Diagnosis Date Noted  . Hyperlipidemia 11/16/2011  . Other screening mammogram 11/16/2011  . Fibrocystic breast 11/16/2011  . Routine general medical examination at a health care facility 11/07/2011  . 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 CROHN'S DISEASE 11/25/2008  . ABDOMINAL MASS, RIGHT LOWER QUADRANT 10/16/2008  . ABDOMINAL PAIN-GENERALIZED 09/25/2008  . NONSPECIFIC ABNORMAL RESULTS LIVR FUNCTION STUDY 09/25/2008  . ESSENTIAL HYPERTENSION 08/22/2008  . OTHER ABNORMAL BLOOD CHEMISTRY 08/01/2008  . ANXIETY DEPRESSION 06/27/2008  . ACNE, MILD 06/27/2008  . EDEMA 06/27/2008   Past Medical History  Diagnosis Date  . PMS (premenstrual syndrome)   . Depression   . Hypertension   . Edema   . Colonic mass 1/10  . Acne   . Crohn's disease   . Hyperlipidemia    Past Surgical History  Procedure Laterality Date  . Hemicolectomy  1/10   History  Substance Use Topics  . Smoking status: Never Smoker   . Smokeless  tobacco: Never Used  . Alcohol Use: No   Family History  Problem Relation Age of Onset  . Colon cancer Mother   . Alcohol abuse Brother   . Hypertension Paternal Grandfather   . Diabetes Paternal Grandfather   . Coronary artery disease Paternal Grandfather   . Coronary artery disease Maternal Grandfather   . Aneurysm Paternal Grandmother     cerebral  . Hypertension Paternal Grandmother   . Pulmonary embolism Father   . Aneurysm Father     cerebral  . Alcohol abuse Son   . Drug abuse Son   . Depression Other     fam hx   Allergies  Allergen Reactions  . Hydrochlorothiazide     Dizzy and felt faint  . Ace Inhibitors     cough  . Fluoxetine Hcl     REACTION: urticartia  . Losartan     Cough   . Tenormin [Atenolol]     fatigue  . Venlafaxine     REACTION: weight gain, makes sleepy  . Bupropion Hcl     REACTION: tinnitus previously but no other symptoms.  She tolerated it on second trial w/o return of tinnitus.     Current Outpatient Prescriptions on File Prior to Visit  Medication Sig  Dispense Refill  . clonazePAM (KLONOPIN) 0.5 MG tablet Take one by mouth as needed       . losartan (COZAAR) 50 MG tablet Take 1 tablet (50 mg total) by mouth daily.  30 tablet  3  . sertraline (ZOLOFT) 50 MG tablet Take 25 mg by mouth daily.       Marland Kitchen spironolactone (ALDACTONE) 25 MG tablet Take 0.5 tablets (12.5 mg total) by mouth daily.  30 tablet  11  . [DISCONTINUED] zolpidem (AMBIEN) 10 MG tablet Take 10 mg by mouth at bedtime as needed.         No current facility-administered medications on file prior to visit.    Review of Systems    Review of Systems  Constitutional: Negative for fever, appetite change, fatigue and unexpected weight change.  Eyes: Negative for pain and visual disturbance.  Respiratory: Negative for cough and shortness of breath.   Cardiovascular: Negative for cp or palpitations    Gastrointestinal: Negative for nausea, diarrhea and constipation.    Genitourinary: Negative for urgency and frequency.  Skin: Negative for pallor or rash   Neurological: Negative for weakness, light-headedness, numbness and headaches.  Hematological: Negative for adenopathy. Does not bruise/bleed easily.  Psychiatric/Behavioral: Negative for dysphoric mood. The patient is not nervous/anxious.  (mood is stable)    Objective:   Physical Exam  Constitutional: She appears well-developed and well-nourished. No distress.  overwt and well app  HENT:  Head: Normocephalic and atraumatic.  Mouth/Throat: Oropharynx is clear and moist.  Eyes: Conjunctivae and EOM are normal. Pupils are equal, round, and reactive to light.  Neck: Normal range of motion. Neck supple. No JVD present. Carotid bruit is not present.  Cardiovascular: Normal rate, regular rhythm, normal heart sounds and intact distal pulses.  Exam reveals no gallop.   No murmur heard. Pulmonary/Chest: Effort normal and breath sounds normal.  No crackles   Abdominal: Soft. Bowel sounds are normal. She exhibits no abdominal bruit.  Lymphadenopathy:    She has no cervical adenopathy.  Neurological: She is alert. She has normal reflexes.  Skin: Skin is warm and dry. No pallor.  Psychiatric: She has a normal mood and affect.          Assessment & Plan:   Problem List Items Addressed This Visit     Cardiovascular and Mediastinum   ESSENTIAL HYPERTENSION - Primary     bp improved with losartan BP: 128/70 mmHg   No side effects  Disc diet/exercise and goal of wt loss  F/u 3 mo

## 2014-07-01 ENCOUNTER — Encounter: Payer: Self-pay | Admitting: Family Medicine

## 2014-07-06 ENCOUNTER — Other Ambulatory Visit: Payer: Self-pay | Admitting: Family Medicine

## 2014-07-16 ENCOUNTER — Ambulatory Visit (INDEPENDENT_AMBULATORY_CARE_PROVIDER_SITE_OTHER): Payer: BC Managed Care – PPO | Admitting: Family Medicine

## 2014-07-16 ENCOUNTER — Encounter: Payer: Self-pay | Admitting: Family Medicine

## 2014-07-16 VITALS — BP 132/90 | HR 102 | Temp 98.1°F | Ht 68.25 in | Wt 191.2 lb

## 2014-07-16 DIAGNOSIS — E785 Hyperlipidemia, unspecified: Secondary | ICD-10-CM

## 2014-07-16 DIAGNOSIS — I10 Essential (primary) hypertension: Secondary | ICD-10-CM

## 2014-07-16 DIAGNOSIS — R000 Tachycardia, unspecified: Secondary | ICD-10-CM

## 2014-07-16 MED ORDER — METOPROLOL SUCCINATE ER 25 MG PO TB24: 25.0000 mg | ORAL_TABLET | Freq: Every day | ORAL | Status: DC

## 2014-07-16 NOTE — Progress Notes (Signed)
Pre visit review using our clinic review tool, if applicable. No additional management support is needed unless otherwise documented below in the visit note. 

## 2014-07-16 NOTE — Patient Instructions (Addendum)
Watch diet for sodium and also cholesterol  Avoid red meat/ fried foods/ egg yolks/ fatty breakfast meats/ butter, cheese and high fat dairy/ and shellfish   Work on weight loss and also good diet and exercise  Start metoprolol as directed- to help bp and pulse  If any problems/ side effects let me know  Follow up in about 4-6 weeks with labs prior (fasting)    Fat and Cholesterol Control Diet Fat and cholesterol levels in your blood and organs are influenced by your diet. High levels of fat and cholesterol may lead to diseases of the heart, small and large blood vessels, gallbladder, liver, and pancreas. CONTROLLING FAT AND CHOLESTEROL WITH DIET Although exercise and lifestyle factors are important, your diet is key. That is because certain foods are known to raise cholesterol and others to lower it. The goal is to balance foods for their effect on cholesterol and more importantly, to replace saturated and trans fat with other types of fat, such as monounsaturated fat, polyunsaturated fat, and omega-3 fatty acids. On average, a person should consume no more than 15 to 17 g of saturated fat daily. Saturated and trans fats are considered "bad" fats, and they will raise LDL cholesterol. Saturated fats are primarily found in animal products such as meats, butter, and cream. However, that does not mean you need to give up all your favorite foods. Today, there are good tasting, low-fat, low-cholesterol substitutes for most of the things you like to eat. Choose low-fat or nonfat alternatives. Choose round or loin cuts of red meat. These types of cuts are lowest in fat and cholesterol. Chicken (without the skin), fish, veal, and ground [place] breast are great choices. Eliminate fatty meats, such as hot dogs and salami. Even shellfish have little or no saturated fat. Have a 3 oz (85 g) portion when you eat lean meat, poultry, or fish. Trans fats are also called "partially hydrogenated oils." They are oils that  have been scientifically manipulated so that they are solid at room temperature resulting in a longer shelf life and improved taste and texture of foods in which they are added. Trans fats are found in stick margarine, some tub margarines, cookies, crackers, and baked goods.  When baking and cooking, oils are a great substitute for butter. The monounsaturated oils are especially beneficial since it is believed they lower LDL and raise HDL. The oils you should avoid entirely are saturated tropical oils, such as coconut and palm.  Remember to eat a lot from food groups that are naturally free of saturated and trans fat, including fish, fruit, vegetables, beans, grains (barley, rice, couscous, bulgur wheat), and pasta (without cream sauces).  IDENTIFYING FOODS THAT LOWER FAT AND CHOLESTEROL  Soluble fiber may lower your cholesterol. This type of fiber is found in fruits such as apples, vegetables such as broccoli, potatoes, and carrots, legumes such as beans, peas, and lentils, and grains such as barley. Foods fortified with plant sterols (phytosterol) may also lower cholesterol. You should eat at least 2 g per day of these foods for a cholesterol lowering effect.  Read package labels to identify low-saturated fats, trans fat free, and low-fat foods at the supermarket. Select cheeses that have only 2 to 3 g saturated fat per ounce. Use a heart-healthy tub margarine that is free of trans fats or partially hydrogenated oil. When buying baked goods (cookies, crackers), avoid partially hydrogenated oils. Breads and muffins should be made from whole grains (whole-wheat or whole oat flour, instead  of "flour" or "enriched flour"). Buy non-creamy canned soups with reduced salt and no added fats.  FOOD PREPARATION TECHNIQUES  Never deep-fry. If you must fry, either stir-fry, which uses very little fat, or use non-stick cooking sprays. When possible, broil, bake, or roast meats, and steam vegetables. Instead of putting  butter or margarine on vegetables, use lemon and herbs, applesauce, and cinnamon (for squash and sweet potatoes). Use nonfat yogurt, salsa, and low-fat dressings for salads.  LOW-SATURATED FAT / LOW-FAT FOOD SUBSTITUTES Meats / Saturated Fat (g)  Avoid: Steak, marbled (3 oz/85 g) / 11 g  Choose: Steak, lean (3 oz/85 g) / 4 g  Avoid: Hamburger (3 oz/85 g) / 7 g  Choose: Hamburger, lean (3 oz/85 g) / 5 g  Avoid: Ham (3 oz/85 g) / 6 g  Choose: Ham, lean cut (3 oz/85 g) / 2.4 g  Avoid: Chicken, with skin, dark meat (3 oz/85 g) / 4 g  Choose: Chicken, skin removed, dark meat (3 oz/85 g) / 2 g  Avoid: Chicken, with skin, light meat (3 oz/85 g) / 2.5 g  Choose: Chicken, skin removed, light meat (3 oz/85 g) / 1 g Dairy / Saturated Fat (g)  Avoid: Whole milk (1 cup) / 5 g  Choose: Low-fat milk, 2% (1 cup) / 3 g  Choose: Low-fat milk, 1% (1 cup) / 1.5 g  Choose: Skim milk (1 cup) / 0.3 g  Avoid: Hard cheese (1 oz/28 g) / 6 g  Choose: Skim milk cheese (1 oz/28 g) / 2 to 3 g  Avoid: Cottage cheese, 4% fat (1 cup) / 6.5 g  Choose: Low-fat cottage cheese, 1% fat (1 cup) / 1.5 g  Avoid: Ice cream (1 cup) / 9 g  Choose: Sherbet (1 cup) / 2.5 g  Choose: Nonfat frozen yogurt (1 cup) / 0.3 g  Choose: Frozen fruit bar / trace  Avoid: Whipped cream (1 tbs) / 3.5 g  Choose: Nondairy whipped topping (1 tbs) / 1 g Condiments / Saturated Fat (g)  Avoid: Mayonnaise (1 tbs) / 2 g  Choose: Low-fat mayonnaise (1 tbs) / 1 g  Avoid: Butter (1 tbs) / 7 g  Choose: Extra light margarine (1 tbs) / 1 g  Avoid: Coconut oil (1 tbs) / 11.8 g  Choose: Olive oil (1 tbs) / 1.8 g  Choose: Corn oil (1 tbs) / 1.7 g  Choose: Safflower oil (1 tbs) / 1.2 g  Choose: Sunflower oil (1 tbs) / 1.4 g  Choose: Soybean oil (1 tbs) / 2.4 g  Choose: Canola oil (1 tbs) / 1 g Document Released: 10/25/2005 Document Revised: 02/19/2013 Document Reviewed: 01/23/2014 Exit[employer] Patient Information 2015  [place], [place]. This information is not intended to replace advice given to you by your health care provider. Make sure you discuss any questions you have with your health care provider.

## 2014-07-16 NOTE — Assessment & Plan Note (Signed)
Disc goals for lipids and reasons to control them Rev labs with pt Rev low sat fat diet in detail Will re check this before next visit  She suspects it is hereditary  Also needs to watch diet-unsure if she can do this with current job/long hours  Will do her best

## 2014-07-16 NOTE — Assessment & Plan Note (Signed)
bp is improved on current medicines but not at goal  Will try low dose metoprolol xl for this and also tachycardia  Will see if she tolerates this better than atenolol  Disc lifestyle change -unable to do much now in light of high stress job that does not allow her time for self care  F/u 4-6 wk with lab prior

## 2014-07-16 NOTE — Progress Notes (Signed)
Subjective:    Patient ID: Veronica [name], female    DOB: 08/23/1961, 53 y.o.   MRN: [id number]  HPI Here for f/u of chronic medical problems   bp is up a bit on first check  At home 148-150/90 - still a little high  No cp or palpitations or headaches or edema  No side effects to medicines  BP Readings from Last 3 Encounters:  07/16/14 132/90  04/12/14 128/70  03/08/14 148/90     She wants to loose weight  bmi is 28  Plan is to eat better and exercise more - not really ready She does have a plan- to work out in the am    Pulse is 102- it tends to run high  She states she is very anxious - a very stressful job ahead  She thinks it is usually in the 90s at home   Pulse Readings from Last 3 Encounters:  07/16/14 102  04/12/14 97  03/08/14 102    Hyperlipidemia  Lab Results  Component Value Date   CHOL 337* 03/01/2014   HDL 41.00 03/01/2014   LDLCALC 243* 03/01/2014   LDLDIRECT 264.6 10/29/2013   TRIG 266.0* 03/01/2014   CHOLHDL 8 03/01/2014   she has never been on cholesterol medicine    Patient Active Problem List   Diagnosis Date Noted  . Hyperlipidemia 11/16/2011  . Other screening mammogram 11/16/2011  . Fibrocystic breast 11/16/2011  . Routine general medical examination at a health care facility 11/07/2011  . 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 CROHN'S DISEASE 11/25/2008  . ABDOMINAL MASS, RIGHT LOWER QUADRANT 10/16/2008  . ABDOMINAL PAIN-GENERALIZED 09/25/2008  . NONSPECIFIC ABNORMAL RESULTS LIVR FUNCTION STUDY 09/25/2008  . ESSENTIAL HYPERTENSION 08/22/2008  . OTHER ABNORMAL BLOOD CHEMISTRY 08/01/2008  . ANXIETY DEPRESSION 06/27/2008  . ACNE, MILD 06/27/2008  . EDEMA 06/27/2008   Past Medical History  Diagnosis Date  . PMS (premenstrual syndrome)   . Depression   . Hypertension   . Edema   . Colonic mass 1/10  . Acne   . Crohn's disease   . Hyperlipidemia    Past Surgical History  Procedure Laterality Date  .  Hemicolectomy  1/10   History  Substance Use Topics  . Smoking status: Never Smoker   . Smokeless tobacco: Never Used  . Alcohol Use: No   Family History  Problem Relation Age of Onset  . Colon cancer Mother   . Alcohol abuse Brother   . Hypertension Paternal Grandfather   . Diabetes Paternal Grandfather   . Coronary artery disease Paternal Grandfather   . Coronary artery disease Maternal Grandfather   . Aneurysm Paternal Grandmother     cerebral  . Hypertension Paternal Grandmother   . Pulmonary embolism Father   . Aneurysm Father     cerebral  . Alcohol abuse Son   . Drug abuse Son   . Depression Other     fam hx   Allergies  Allergen Reactions  . Hydrochlorothiazide     Dizzy and felt faint  . Ace Inhibitors     cough  . Fluoxetine Hcl     REACTION: urticartia  . Losartan     Cough   . Tenormin [Atenolol]     fatigue  . Venlafaxine     REACTION: weight gain, makes sleepy  . Bupropion Hcl     REACTION: tinnitus previously but no other symptoms.  She tolerated it on second trial w/o return of tinnitus.  Current Outpatient Prescriptions on File Prior to Visit  Medication Sig Dispense Refill  . clonazePAM (KLONOPIN) 0.5 MG tablet Take one by mouth as needed       . losartan (COZAAR) 50 MG tablet TAKE 1 TABLET (50 MG TOTAL) BY MOUTH DAILY.  30 tablet  0  . sertraline (ZOLOFT) 50 MG tablet Take 25 mg by mouth daily.       Marland Kitchen spironolactone (ALDACTONE) 25 MG tablet Take 0.5 tablets (12.5 mg total) by mouth daily.  30 tablet  11  . [DISCONTINUED] zolpidem (AMBIEN) 10 MG tablet Take 10 mg by mouth at bedtime as needed.         No current facility-administered medications on file prior to visit.      Review of Systems Review of Systems  Constitutional: Negative for fever, appetite change, and unexpected weight change. pos for fatigue  Eyes: Negative for pain and visual disturbance.  Respiratory: Negative for cough and shortness of breath.   Cardiovascular:  Negative for cp and sob, pos for occ palpitations Gastrointestinal: Negative for nausea, diarrhea and constipation.  Genitourinary: Negative for urgency and frequency.  Skin: Negative for pallor or rash   Neurological: Negative for weakness, light-headedness, numbness and headaches.  Hematological: Negative for adenopathy. Does not bruise/bleed easily.  Psychiatric/Behavioral: Negative for dysphoric mood. The patient is nervous/anxious.  pos for stressors        Objective:   Physical Exam  Constitutional: She appears well-developed and well-nourished. No distress.  overwt and fatigued appearing   HENT:  Head: Normocephalic and atraumatic.  Mouth/Throat: Oropharynx is clear and moist.  Eyes: Conjunctivae and EOM are normal. Pupils are equal, round, and reactive to light. No scleral icterus.  Neck: Normal range of motion. Neck supple. No JVD present. Carotid bruit is not present. No thyromegaly present.  Cardiovascular: Regular rhythm, normal heart sounds and intact distal pulses.   Pulmonary/Chest: Effort normal and breath sounds normal. No respiratory distress. She has no wheezes. She has no rales.  Abdominal: Soft. Bowel sounds are normal.  Musculoskeletal: She exhibits no edema and no tenderness.  Lymphadenopathy:    She has no cervical adenopathy.  Neurological: She is alert. She has normal reflexes. No cranial nerve deficit. She exhibits normal muscle tone. Coordination normal.  No tremor  Skin: Skin is warm and dry. No rash noted. No erythema. No pallor.  Psychiatric: Her speech is normal and behavior is normal. Thought content normal. Her mood appears anxious. Her affect is not blunt, not labile and not inappropriate. She does not exhibit a depressed mood.  Seems generally stressed and keyed up           Assessment & Plan:   Problem List Items Addressed This Visit     Cardiovascular and Mediastinum   ESSENTIAL HYPERTENSION - Primary     bp is improved on current  medicines but not at goal  Will try low dose metoprolol xl for this and also tachycardia  Will see if she tolerates this better than atenolol  Disc lifestyle change -unable to do much now in light of high stress job that does not allow her time for self care  F/u 4-6 wk with lab prior     Relevant Medications      metoprolol succinate (TOPROL-XL) 24 hr tablet     Other   Hyperlipidemia     Disc goals for lipids and reasons to control them Rev labs with pt Rev low sat fat diet in detail Will re  check this before next visit  She suspects it is hereditary  Also needs to watch diet-unsure if she can do this with current job/long hours  Will do her best     Relevant Medications      metoprolol succinate (TOPROL-XL) 24 hr tablet   Tachycardia     Pulse of 102 today and at home in 90s Pt thinks stressors/anx and inability to find time to exercise may play a role Trial of metoprolol 25 xl - and see if tolerated for this and bp  Will update if side eff F/u 4-6 wk

## 2014-07-16 NOTE — Assessment & Plan Note (Signed)
Pulse of 102 today and at home in 90s Pt thinks stressors/anx and inability to find time to exercise may play a role Trial of metoprolol 25 xl - and see if tolerated for this and bp  Will update if side eff F/u 4-6 wk

## 2014-07-24 ENCOUNTER — Encounter: Payer: Self-pay | Admitting: Gastroenterology

## 2014-08-11 ENCOUNTER — Other Ambulatory Visit: Payer: Self-pay | Admitting: Family Medicine

## 2014-08-18 ENCOUNTER — Other Ambulatory Visit: Payer: Self-pay | Admitting: Family Medicine

## 2014-08-20 ENCOUNTER — Other Ambulatory Visit (INDEPENDENT_AMBULATORY_CARE_PROVIDER_SITE_OTHER): Payer: BC Managed Care – PPO

## 2014-08-20 DIAGNOSIS — I10 Essential (primary) hypertension: Secondary | ICD-10-CM

## 2014-08-20 DIAGNOSIS — E785 Hyperlipidemia, unspecified: Secondary | ICD-10-CM

## 2014-08-20 LAB — LIPID PANEL
Cholesterol: 317 mg/dL — ABNORMAL HIGH (ref 0–200)
HDL: 37.9 mg/dL — AB (ref >39.00)
NonHDL: 279.1
TRIGLYCERIDES: 258 mg/dL — AB (ref 0.0–149.0)
Total CHOL/HDL Ratio: 8
VLDL: 51.6 mg/dL — ABNORMAL HIGH (ref 0.0–40.0)

## 2014-08-20 LAB — COMPREHENSIVE METABOLIC PANEL
ALT: 29 U/L (ref 0–35)
AST: 24 U/L (ref 0–37)
Albumin: 3.1 g/dL — ABNORMAL LOW (ref 3.5–5.2)
Alkaline Phosphatase: 123 U/L — ABNORMAL HIGH (ref 39–117)
BUN: 11 mg/dL (ref 6–23)
CO2: 21 meq/L (ref 19–32)
CREATININE: 0.9 mg/dL (ref 0.4–1.2)
Calcium: 8.7 mg/dL (ref 8.4–10.5)
Chloride: 107 mEq/L (ref 96–112)
GFR: 73.75 mL/min (ref >60.00)
Glucose, Bld: 105 mg/dL — ABNORMAL HIGH (ref 70–99)
Potassium: 3.6 mEq/L (ref 3.5–5.1)
Sodium: 137 mEq/L (ref 135–145)
Total Bilirubin: 0.5 mg/dL (ref 0.2–1.2)
Total Protein: 7.2 g/dL (ref 6.0–8.3)

## 2014-08-20 LAB — LDL CHOLESTEROL, DIRECT: Direct LDL: 217.5 mg/dL

## 2014-08-27 ENCOUNTER — Ambulatory Visit (INDEPENDENT_AMBULATORY_CARE_PROVIDER_SITE_OTHER): Payer: BC Managed Care – PPO | Admitting: Family Medicine

## 2014-08-27 ENCOUNTER — Encounter: Payer: Self-pay | Admitting: Family Medicine

## 2014-08-27 VITALS — BP 124/84 | HR 86 | Temp 98.4°F | Ht 68.25 in | Wt 194.0 lb

## 2014-08-27 DIAGNOSIS — E785 Hyperlipidemia, unspecified: Secondary | ICD-10-CM

## 2014-08-27 DIAGNOSIS — I10 Essential (primary) hypertension: Secondary | ICD-10-CM

## 2014-08-27 DIAGNOSIS — R000 Tachycardia, unspecified: Secondary | ICD-10-CM

## 2014-08-27 NOTE — Progress Notes (Signed)
Subjective:    Patient ID: Veronica [name], female    DOB: August 15, 1961, 53 y.o.   MRN: [id number]  HPI Here for f/u of HTN and other chronic problems   bp is stable today  No cp or palpitations or headaches or edema  No side effects to medicines  BP Readings from Last 3 Encounters:  08/27/14 124/84  07/16/14 132/90  04/12/14 128/70     Tachycardia improved with  Thinks she is tolerating it pretty well  Pulse Readings from Last 3 Encounters:  08/27/14 86  07/16/14 102  04/12/14 97    Wt is up 3 lb with bmi of 29  Hyperlipidemia Lab Results  Component Value Date   CHOL 317* 08/20/2014   CHOL 337* 03/01/2014   CHOL 333* 10/29/2013   Lab Results  Component Value Date   HDL 37.90* 08/20/2014   HDL 41.00 03/01/2014   HDL 47.20 10/29/2013   Lab Results  Component Value Date   LDLCALC 243* 03/01/2014   Lab Results  Component Value Date   TRIG 258.0* 08/20/2014   TRIG 266.0* 03/01/2014   TRIG 240.0* 10/29/2013   Lab Results  Component Value Date   CHOLHDL 8 08/20/2014   CHOLHDL 8 03/01/2014   CHOLHDL 7 10/29/2013   Lab Results  Component Value Date   LDLDIRECT 217.5 08/20/2014   LDLDIRECT 264.6 10/29/2013   LDLDIRECT 190.6 02/11/2012   has watched diet  LDL is down a bit but still very very high   Patient Active Problem List   Diagnosis Date Noted  . Tachycardia 07/16/2014  . Hyperlipidemia 11/16/2011  . Other screening mammogram 11/16/2011  . Fibrocystic breast 11/16/2011  . Routine general medical examination at a health care facility 11/07/2011  . 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 CROHN'S DISEASE 11/25/2008  . ABDOMINAL MASS, RIGHT LOWER QUADRANT 10/16/2008  . ABDOMINAL PAIN-GENERALIZED 09/25/2008  . NONSPECIFIC ABNORMAL RESULTS LIVR FUNCTION STUDY 09/25/2008  . Essential hypertension 08/22/2008  . OTHER ABNORMAL BLOOD CHEMISTRY 08/01/2008  . ANXIETY DEPRESSION 06/27/2008  . ACNE, MILD 06/27/2008  . EDEMA 06/27/2008   Past  Medical History  Diagnosis Date  . PMS (premenstrual syndrome)   . Depression   . Hypertension   . Edema   . Colonic mass 1/10  . Acne   . Crohn's disease   . Hyperlipidemia    Past Surgical History  Procedure Laterality Date  . Hemicolectomy  1/10   History  Substance Use Topics  . Smoking status: Never Smoker   . Smokeless tobacco: Never Used  . Alcohol Use: No   Family History  Problem Relation Age of Onset  . Colon cancer Mother   . Alcohol abuse Brother   . Hypertension Paternal Grandfather   . Diabetes Paternal Grandfather   . Coronary artery disease Paternal Grandfather   . Coronary artery disease Maternal Grandfather   . Aneurysm Paternal Grandmother     cerebral  . Hypertension Paternal Grandmother   . Pulmonary embolism Father   . Aneurysm Father     cerebral  . Alcohol abuse Son   . Drug abuse Son   . Depression Other     fam hx   Allergies  Allergen Reactions  . Hydrochlorothiazide     Dizzy and felt faint  . Ace Inhibitors     cough  . Fluoxetine Hcl     REACTION: urticartia  . Losartan     Cough   . Tenormin [Atenolol]  fatigue  . Venlafaxine     REACTION: weight gain, makes sleepy  . Bupropion Hcl     REACTION: tinnitus previously but no other symptoms.  She tolerated it on second trial w/o return of tinnitus.     Current Outpatient Prescriptions on File Prior to Visit  Medication Sig Dispense Refill  . clonazePAM (KLONOPIN) 0.5 MG tablet Take one by mouth as needed       . losartan (COZAAR) 50 MG tablet TAKE 1 TABLET (50 MG TOTAL) BY MOUTH DAILY.  30 tablet  0  . metoprolol succinate (TOPROL-XL) 25 MG 24 hr tablet Take 1 tablet (25 mg total) by mouth daily.  30 tablet  3  . sertraline (ZOLOFT) 50 MG tablet Take 25 mg by mouth daily.       Marland Kitchen spironolactone (ALDACTONE) 25 MG tablet Take 0.5 tablets (12.5 mg total) by mouth daily.  30 tablet  11  . [DISCONTINUED] zolpidem (AMBIEN) 10 MG tablet Take 10 mg by mouth at bedtime as needed.          No current facility-administered medications on file prior to visit.      Review of Systems    Review of Systems  Constitutional: Negative for fever, appetite change, fatigue and unexpected weight change.  Eyes: Negative for pain and visual disturbance.  Respiratory: Negative for cough and shortness of breath.   Cardiovascular: Negative for cp or palpitations    Gastrointestinal: Negative for nausea, diarrhea and constipation.  Genitourinary: Negative for urgency and frequency.  Skin: Negative for pallor or rash   Neurological: Negative for weakness, light-headedness, numbness and headaches.  Hematological: Negative for adenopathy. Does not bruise/bleed easily.  Psychiatric/Behavioral: Negative for dysphoric mood. The patient is not nervous/anxious.      Objective:   Physical Exam  Constitutional: She appears well-developed and well-nourished. No distress.  overwt and well app  HENT:  Head: Normocephalic and atraumatic.  Mouth/Throat: Oropharynx is clear and moist.  Eyes: Conjunctivae and EOM are normal. Pupils are equal, round, and reactive to light. No scleral icterus.  Neck: Normal range of motion. Neck supple. No JVD present. Carotid bruit is not present. No thyromegaly present.  Cardiovascular: Normal rate, regular rhythm, normal heart sounds and intact distal pulses.  Exam reveals no gallop.   Pulmonary/Chest: Effort normal and breath sounds normal. No respiratory distress. She has no wheezes. She exhibits no tenderness.  Abdominal: Soft. Bowel sounds are normal. She exhibits no abdominal bruit.  Musculoskeletal: She exhibits no edema.  Lymphadenopathy:    She has no cervical adenopathy.  Neurological: She is alert. She has normal reflexes. No cranial nerve deficit. She exhibits normal muscle tone. Coordination normal.  Skin: Skin is warm and dry. No rash noted. No erythema. No pallor.  Psychiatric: She has a normal mood and affect.          Assessment & Plan:    Problem List Items Addressed This Visit     Cardiovascular and Mediastinum   Essential hypertension - Primary      Much improved with metoprolol and well tolerated Helpful for tachycardia as well  bp in fair control at this time  BP Readings from Last 1 Encounters:  08/27/14 124/84    Disc lifstyle change with low sodium diet and exercise        Other   Hyperlipidemia     Lipids continue to be very high  Disc goals for lipids and reasons to control them Rev labs with pt Rev low  sat fat diet in detail She is not ready for statin yet - I did inst her that she will need med to get this to goal She will think about that and work on diet     Tachycardia     Improved with metoprolol No side eff Will continue it

## 2014-08-27 NOTE — Patient Instructions (Signed)
Take care of yourself  Eat a healthy low sugar and low fat diet  Try to add some exercise at least 5 days per week  Cholesterol is very high  If /when you are ready to try medication (a statin -likely we would start with atorvastatin -which is generic lipitor)- let me know  Schedule a physical after May 1     Fat and Cholesterol Control Diet Fat and cholesterol levels in your blood and organs are influenced by your diet. High levels of fat and cholesterol may lead to diseases of the heart, small and large blood vessels, gallbladder, liver, and pancreas. CONTROLLING FAT AND CHOLESTEROL WITH DIET Although exercise and lifestyle factors are important, your diet is key. That is because certain foods are known to raise cholesterol and others to lower it. The goal is to balance foods for their effect on cholesterol and more importantly, to replace saturated and trans fat with other types of fat, such as monounsaturated fat, polyunsaturated fat, and omega-3 fatty acids. On average, a person should consume no more than 15 to 17 g of saturated fat daily. Saturated and trans fats are considered "bad" fats, and they will raise LDL cholesterol. Saturated fats are primarily found in animal products such as meats, butter, and cream. However, that does not mean you need to give up all your favorite foods. Today, there are good tasting, low-fat, low-cholesterol substitutes for most of the things you like to eat. Choose low-fat or nonfat alternatives. Choose round or loin cuts of red meat. These types of cuts are lowest in fat and cholesterol. Chicken (without the skin), fish, veal, and ground [place] breast are great choices. Eliminate fatty meats, such as hot dogs and salami. Even shellfish have little or no saturated fat. Have a 3 oz (85 g) portion when you eat lean meat, poultry, or fish. Trans fats are also called "partially hydrogenated oils." They are oils that have been scientifically manipulated so that they  are solid at room temperature resulting in a longer shelf life and improved taste and texture of foods in which they are added. Trans fats are found in stick margarine, some tub margarines, cookies, crackers, and baked goods.  When baking and cooking, oils are a great substitute for butter. The monounsaturated oils are especially beneficial since it is believed they lower LDL and raise HDL. The oils you should avoid entirely are saturated tropical oils, such as coconut and palm.  Remember to eat a lot from food groups that are naturally free of saturated and trans fat, including fish, fruit, vegetables, beans, grains (barley, rice, couscous, bulgur wheat), and pasta (without cream sauces).  IDENTIFYING FOODS THAT LOWER FAT AND CHOLESTEROL  Soluble fiber may lower your cholesterol. This type of fiber is found in fruits such as apples, vegetables such as broccoli, potatoes, and carrots, legumes such as beans, peas, and lentils, and grains such as barley. Foods fortified with plant sterols (phytosterol) may also lower cholesterol. You should eat at least 2 g per day of these foods for a cholesterol lowering effect.  Read package labels to identify low-saturated fats, trans fat free, and low-fat foods at the supermarket. Select cheeses that have only 2 to 3 g saturated fat per ounce. Use a heart-healthy tub margarine that is free of trans fats or partially hydrogenated oil. When buying baked goods (cookies, crackers), avoid partially hydrogenated oils. Breads and muffins should be made from whole grains (whole-wheat or whole oat flour, instead of "flour" or "enriched flour").  Buy non-creamy canned soups with reduced salt and no added fats.  FOOD PREPARATION TECHNIQUES  Never deep-fry. If you must fry, either stir-fry, which uses very little fat, or use non-stick cooking sprays. When possible, broil, bake, or roast meats, and steam vegetables. Instead of putting butter or margarine on vegetables, use lemon and  herbs, applesauce, and cinnamon (for squash and sweet potatoes). Use nonfat yogurt, salsa, and low-fat dressings for salads.  LOW-SATURATED FAT / LOW-FAT FOOD SUBSTITUTES Meats / Saturated Fat (g)  Avoid: Steak, marbled (3 oz/85 g) / 11 g  Choose: Steak, lean (3 oz/85 g) / 4 g  Avoid: Hamburger (3 oz/85 g) / 7 g  Choose: Hamburger, lean (3 oz/85 g) / 5 g  Avoid: Ham (3 oz/85 g) / 6 g  Choose: Ham, lean cut (3 oz/85 g) / 2.4 g  Avoid: Chicken, with skin, dark meat (3 oz/85 g) / 4 g  Choose: Chicken, skin removed, dark meat (3 oz/85 g) / 2 g  Avoid: Chicken, with skin, light meat (3 oz/85 g) / 2.5 g  Choose: Chicken, skin removed, light meat (3 oz/85 g) / 1 g Dairy / Saturated Fat (g)  Avoid: Whole milk (1 cup) / 5 g  Choose: Low-fat milk, 2% (1 cup) / 3 g  Choose: Low-fat milk, 1% (1 cup) / 1.5 g  Choose: Skim milk (1 cup) / 0.3 g  Avoid: Hard cheese (1 oz/28 g) / 6 g  Choose: Skim milk cheese (1 oz/28 g) / 2 to 3 g  Avoid: Cottage cheese, 4% fat (1 cup) / 6.5 g  Choose: Low-fat cottage cheese, 1% fat (1 cup) / 1.5 g  Avoid: Ice cream (1 cup) / 9 g  Choose: Sherbet (1 cup) / 2.5 g  Choose: Nonfat frozen yogurt (1 cup) / 0.3 g  Choose: Frozen fruit bar / trace  Avoid: Whipped cream (1 tbs) / 3.5 g  Choose: Nondairy whipped topping (1 tbs) / 1 g Condiments / Saturated Fat (g)  Avoid: Mayonnaise (1 tbs) / 2 g  Choose: Low-fat mayonnaise (1 tbs) / 1 g  Avoid: Butter (1 tbs) / 7 g  Choose: Extra light margarine (1 tbs) / 1 g  Avoid: Coconut oil (1 tbs) / 11.8 g  Choose: Olive oil (1 tbs) / 1.8 g  Choose: Corn oil (1 tbs) / 1.7 g  Choose: Safflower oil (1 tbs) / 1.2 g  Choose: Sunflower oil (1 tbs) / 1.4 g  Choose: Soybean oil (1 tbs) / 2.4 g  Choose: Canola oil (1 tbs) / 1 g Document Released: 10/25/2005 Document Revised: 02/19/2013 Document Reviewed: 01/23/2014 [employer] Patient Information 2015 [place], [place]. This information is not intended to  replace advice given to you by your health care provider. Make sure you discuss any questions you have with your health care provider.

## 2014-08-27 NOTE — Progress Notes (Signed)
Pre visit review using our clinic review tool, if applicable. No additional management support is needed unless otherwise documented below in the visit note. 

## 2014-09-01 NOTE — Assessment & Plan Note (Signed)
Improved with metoprolol No side eff Will continue it

## 2014-09-01 NOTE — Assessment & Plan Note (Signed)
Much improved with metoprolol and well tolerated Helpful for tachycardia as well  bp in fair control at this time  BP Readings from Last 1 Encounters:  08/27/14 124/84    Disc lifstyle change with low sodium diet and exercise

## 2014-09-01 NOTE — Assessment & Plan Note (Signed)
Lipids continue to be very high  Disc goals for lipids and reasons to control them Rev labs with pt Rev low sat fat diet in detail She is not ready for statin yet - I did inst her that she will need med to get this to goal She will think about that and work on diet

## 2014-09-14 ENCOUNTER — Other Ambulatory Visit: Payer: Self-pay | Admitting: Family Medicine

## 2014-11-16 ENCOUNTER — Other Ambulatory Visit: Payer: Self-pay | Admitting: Family Medicine

## 2014-11-18 NOTE — Telephone Encounter (Signed)
done

## 2014-11-18 NOTE — Telephone Encounter (Signed)
That is ok  Please refill for a year

## 2014-11-18 NOTE — Telephone Encounter (Signed)
Received refill request electronically from pharmacy. See allergy/contraindication. Is it okay to refill medication?

## 2015-03-03 ENCOUNTER — Telehealth: Payer: Self-pay | Admitting: Family Medicine

## 2015-03-03 DIAGNOSIS — Z0000 Encounter for general adult medical examination without abnormal findings: Secondary | ICD-10-CM

## 2015-03-03 NOTE — Telephone Encounter (Signed)
-----   Message from Ellamae Sia sent at 03/03/2015  8:16 AM EDT ----- Regarding: Lab orders for Tuesday, 4.26.16 Patient is scheduled for CPX labs, please order future labs, Thanks , Karna Christmas

## 2015-03-04 ENCOUNTER — Other Ambulatory Visit (INDEPENDENT_AMBULATORY_CARE_PROVIDER_SITE_OTHER): Payer: No Typology Code available for payment source

## 2015-03-04 DIAGNOSIS — R7989 Other specified abnormal findings of blood chemistry: Secondary | ICD-10-CM

## 2015-03-04 DIAGNOSIS — Z0000 Encounter for general adult medical examination without abnormal findings: Secondary | ICD-10-CM | POA: Diagnosis not present

## 2015-03-04 LAB — CBC WITH DIFFERENTIAL/PLATELET
BASOS PCT: 1 % (ref 0.0–3.0)
Basophils Absolute: 0.1 10*3/uL (ref 0.0–0.1)
Eosinophils Absolute: 0.2 10*3/uL (ref 0.0–0.7)
Eosinophils Relative: 3.1 % (ref 0.0–5.0)
HCT: 37.1 % (ref 36.0–46.0)
Hemoglobin: 12.4 g/dL (ref 12.0–15.0)
LYMPHS ABS: 1.9 10*3/uL (ref 0.7–4.0)
Lymphocytes Relative: 29 % (ref 12.0–46.0)
MCHC: 33.4 g/dL (ref 30.0–36.0)
MCV: 84.3 fl (ref 78.0–100.0)
Monocytes Absolute: 0.5 10*3/uL (ref 0.1–1.0)
Monocytes Relative: 7.4 % (ref 3.0–12.0)
NEUTROS ABS: 4 10*3/uL (ref 1.4–7.7)
Neutrophils Relative %: 59.5 % (ref 43.0–77.0)
Platelets: 377 10*3/uL (ref 150.0–400.0)
RBC: 4.4 Mil/uL (ref 3.87–5.11)
RDW: 14.1 % (ref 11.5–15.5)
WBC: 6.7 10*3/uL (ref 4.0–10.5)

## 2015-03-04 LAB — COMPREHENSIVE METABOLIC PANEL
ALT: 31 U/L (ref 0–35)
AST: 24 U/L (ref 0–37)
Albumin: 4 g/dL (ref 3.5–5.2)
Alkaline Phosphatase: 157 U/L — ABNORMAL HIGH (ref 39–117)
BUN: 10 mg/dL (ref 6–23)
CALCIUM: 9.3 mg/dL (ref 8.4–10.5)
CO2: 28 mEq/L (ref 19–32)
Chloride: 104 mEq/L (ref 96–112)
Creatinine, Ser: 0.78 mg/dL (ref 0.40–1.20)
GFR: 82.37 mL/min (ref >60.00)
Glucose, Bld: 95 mg/dL (ref 70–99)
POTASSIUM: 3.9 meq/L (ref 3.5–5.1)
SODIUM: 138 meq/L (ref 135–145)
Total Bilirubin: 0.3 mg/dL (ref 0.2–1.2)
Total Protein: 6.9 g/dL (ref 6.0–8.3)

## 2015-03-04 LAB — LIPID PANEL
CHOL/HDL RATIO: 7
CHOLESTEROL: 277 mg/dL — AB (ref 0–200)
HDL: 39.6 mg/dL (ref >39.00)
NonHDL: 237.4
Triglycerides: 220 mg/dL — ABNORMAL HIGH (ref 0.0–149.0)
VLDL: 44 mg/dL — ABNORMAL HIGH (ref 0.0–40.0)

## 2015-03-04 LAB — TSH: TSH: 2.12 u[IU]/mL (ref 0.35–4.50)

## 2015-03-04 LAB — LDL CHOLESTEROL, DIRECT: Direct LDL: 213 mg/dL

## 2015-03-11 ENCOUNTER — Encounter: Payer: Self-pay | Admitting: Family Medicine

## 2015-03-11 ENCOUNTER — Ambulatory Visit (INDEPENDENT_AMBULATORY_CARE_PROVIDER_SITE_OTHER): Payer: No Typology Code available for payment source | Admitting: Family Medicine

## 2015-03-11 ENCOUNTER — Other Ambulatory Visit (HOSPITAL_COMMUNITY)
Admission: RE | Admit: 2015-03-11 | Discharge: 2015-03-11 | Disposition: A | Discharge status: Home / Self Care | Payer: No Typology Code available for payment source | Source: Ambulatory Visit | Attending: Family Medicine | Admitting: Family Medicine

## 2015-03-11 VITALS — BP 130/85 | HR 100 | Temp 98.1°F | Ht 68.5 in | Wt 184.0 lb

## 2015-03-11 DIAGNOSIS — Z0000 Encounter for general adult medical examination without abnormal findings: Secondary | ICD-10-CM | POA: Diagnosis not present

## 2015-03-11 DIAGNOSIS — Z01419 Encounter for gynecological examination (general) (routine) without abnormal findings: Secondary | ICD-10-CM | POA: Insufficient documentation

## 2015-03-11 DIAGNOSIS — R6889 Other general symptoms and signs: Secondary | ICD-10-CM | POA: Insufficient documentation

## 2015-03-11 DIAGNOSIS — I10 Essential (primary) hypertension: Secondary | ICD-10-CM

## 2015-03-11 DIAGNOSIS — Z01411 Encounter for gynecological examination (general) (routine) with abnormal findings: Secondary | ICD-10-CM | POA: Diagnosis not present

## 2015-03-11 DIAGNOSIS — Z1151 Encounter for screening for human papillomavirus (HPV): Secondary | ICD-10-CM | POA: Diagnosis present

## 2015-03-11 DIAGNOSIS — E785 Hyperlipidemia, unspecified: Secondary | ICD-10-CM

## 2015-03-11 DIAGNOSIS — R002 Palpitations: Secondary | ICD-10-CM | POA: Insufficient documentation

## 2015-03-11 MED ORDER — METOPROLOL SUCCINATE ER 25 MG PO TB24: ORAL_TABLET | ORAL | Status: DC

## 2015-03-11 MED ORDER — LOSARTAN POTASSIUM 50 MG PO TABS: ORAL_TABLET | ORAL | Status: DC

## 2015-03-11 MED ORDER — SPIRONOLACTONE 25 MG PO TABS: 12.5000 mg | ORAL_TABLET | Freq: Every day | ORAL | Status: DC

## 2015-03-11 NOTE — Assessment & Plan Note (Signed)
Improved significantly with low sat fat diet (commended) but still very high LDL over 200 on its own  fam hx of vasc dz

## 2015-03-11 NOTE — Assessment & Plan Note (Addendum)
Per pt = for years (with hx of tachycardia tx by beta blocker) EKG today NSR with rate of 81    (down from HR of 100 at beginning of visit)- bp improved on 2nd check also

## 2015-03-11 NOTE — Patient Instructions (Addendum)
Don't forget to schedule your mammogram  For cholesterol  Avoid red meat/ fried foods/ egg yolks/ fatty breakfast meats/ butter, cheese and high fat dairy/ and shellfish  - great job so far  Stop at check out for referral to cardiology

## 2015-03-11 NOTE — Progress Notes (Signed)
Subjective:    Patient ID: Veronica [name], female    DOB: [birth year], 54 y.o.   MRN: [id number]  HPI Here for health maintenance exam and to review chronic medical problems    Wt is down 10 lb with bmi of 27 Is trying to loose  She stopped eating meat and dairy - feels better overall (less headaches) Is very fatigued (chronic)- hard to exercise --- gets dizzy with exercise and feels like she will pass out  Protein sources - beans/ almonds/peanuts/oatmeal Avoiding junk food   Hep C screen 10/09 HIV screen - does not want/not high risk   Pap 1/10-needs this  Still having periods - still monthly     Mammogram 2/13- missed it last year  Mother has a non cancer mass in breast   Flu shot - did not get this season   Td 11/13  colonosc 8/15 - 5 yr recall  Hx of chron's with hemicolectomy Also mother had colon cancer    bp is up on first check today  No cp or palpitations or headaches or edema  No side effects to medicines  BP Readings from Last 3 Encounters:  03/11/15 148/78  08/27/14 124/84  07/16/14 132/90       Chemistry      Component Value Date/Time   NA 138 03/04/2015 0855   K 3.9 03/04/2015 0855   CL 104 03/04/2015 0855   CO2 28 03/04/2015 0855   BUN 10 03/04/2015 0855   CREATININE 0.78 03/04/2015 0855   CREATININE 0.86 05/07/2011 1708      Component Value Date/Time   CALCIUM 9.3 03/04/2015 0855   ALKPHOS 157* 03/04/2015 0855   AST 24 03/04/2015 0855   ALT 31 03/04/2015 0855   BILITOT 0.3 03/04/2015 0855      Hx of hyperlipidemia Lab Results  Component Value Date   CHOL 277* 03/04/2015   CHOL 317* 08/20/2014   CHOL 337* 03/01/2014   Lab Results  Component Value Date   HDL 39.60 03/04/2015   HDL 37.90* 08/20/2014   HDL 41.00 03/01/2014   Lab Results  Component Value Date   LDLCALC 243* 03/01/2014   Lab Results  Component Value Date   TRIG 220.0* 03/04/2015   TRIG 258.0* 08/20/2014   TRIG 266.0* 03/01/2014   Lab Results  Component  Value Date   CHOLHDL 7 03/04/2015   CHOLHDL 8 08/20/2014   CHOLHDL 8 03/01/2014   Lab Results  Component Value Date   LDLDIRECT 213.0 03/04/2015   LDLDIRECT 217.5 08/20/2014   LDLDIRECT 264.6 10/29/2013    Lab Results  Component Value Date   WBC 6.7 03/04/2015   HGB 12.4 03/04/2015   HCT 37.1 03/04/2015   MCV 84.3 03/04/2015   PLT 377.0 03/04/2015    Lab Results  Component Value Date   TSH 2.12 03/04/2015     Review of Systems Review of Systems  Constitutional: Negative for fever, appetite change,  and unexpected weight change. pos for exercise intolerance  Eyes: Negative for pain and visual disturbance.  Respiratory: Negative for cough and shortness of breath.   Cardiovascular: Negative for cp or palpitations    Gastrointestinal: Negative for nausea, diarrhea and constipation.  Genitourinary: Negative for urgency and frequency.  Skin: Negative for pallor or rash   Neurological: Negative for weakness, , numbness and headaches.  Hematological: Negative for adenopathy. Does not bruise/bleed easily.  Psychiatric/Behavioral: Negative for dysphoric mood. The patient is not nervous/anxious.  Objective:   Physical Exam  Constitutional: She appears well-developed and well-nourished. No distress.  HENT:  Head: Normocephalic and atraumatic.  Right Ear: External ear normal.  Left Ear: External ear normal.  Mouth/Throat: Oropharynx is clear and moist.  Eyes: Conjunctivae and EOM are normal. Pupils are equal, round, and reactive to light. No scleral icterus.  Neck: Normal range of motion. Neck supple. No JVD present. Carotid bruit is not present. No thyromegaly present.  Cardiovascular: Normal rate, regular rhythm, normal heart sounds and intact distal pulses.  Exam reveals no gallop.   Pulmonary/Chest: Effort normal and breath sounds normal. No respiratory distress. She has no wheezes. She exhibits no tenderness.  Abdominal: Soft. Bowel sounds are normal. She exhibits no  distension, no abdominal bruit and no mass. There is no tenderness.  Genitourinary: Vagina normal and uterus normal. No breast swelling, tenderness, discharge or bleeding. There is no rash, tenderness or lesion on the right labia. There is no rash, tenderness or lesion on the left labia. Uterus is not enlarged and not tender. Cervix exhibits no motion tenderness, no discharge and no friability. Right adnexum displays no mass, no tenderness and no fullness. Left adnexum displays no mass, no tenderness and no fullness. No bleeding in the vagina. No vaginal discharge found.  Breast exam: No mass, nodules, thickening, tenderness, bulging, retraction, inflamation, nipple discharge or skin changes noted.  No axillary or clavicular LA.      Musculoskeletal: Normal range of motion. She exhibits no edema or tenderness.  Lymphadenopathy:    She has no cervical adenopathy.  Neurological: She is alert. She has normal reflexes. No cranial nerve deficit. She exhibits normal muscle tone. Coordination normal.  Skin: Skin is warm and dry. No rash noted. No erythema. No pallor.  Psychiatric: She has a normal mood and affect.          Assessment & Plan:   Problem List Items Addressed This Visit    Encounter for routine gynecological examination    Routine exam and pap done today  No c/o  Still regular menses       Relevant Orders   Cytology - PAP (Completed)   Essential hypertension    bp in fair control at this time  BP Readings from Last 1 Encounters:  03/11/15 130/85   No changes needed Disc lifstyle change with low sodium diet and exercise  Labs reviewed       Relevant Medications   losartan (COZAAR) 50 MG tablet   metoprolol succinate (TOPROL-XL) 25 MG 24 hr tablet   spironolactone (ALDACTONE) 25 MG tablet   Exercise intolerance    Pt unable to exercise due to dizziness when starting Also facial flushing and general malaise (no cp or sob)      Relevant Orders   Ambulatory referral to  Cardiology   Hyperlipidemia    Improved significantly with low sat fat diet (commended) but still very high LDL over 200 on its own  fam hx of vasc dz       Relevant Medications   losartan (COZAAR) 50 MG tablet   metoprolol succinate (TOPROL-XL) 25 MG 24 hr tablet   spironolactone (ALDACTONE) 25 MG tablet   Other Relevant Orders   Ambulatory referral to Cardiology   Palpitations    Per pt = for years (with hx of tachycardia tx by beta blocker) EKG today NSR with rate of 81    (down from HR of 100 at beginning of visit)- bp improved on 2nd check also  Relevant Orders   EKG 12-Lead (Completed)   Ambulatory referral to Cardiology   Routine general medical examination at a health care facility - Primary    Reviewed health habits including diet and exercise and skin cancer prevention Reviewed appropriate screening tests for age  Also reviewed health mt list, fam hx and immunization status , as well as social and family history   Labs rev  See HPI She will schedule own mammogram/ has dense breasts  Will f/u later for ear flush for cerumen

## 2015-03-11 NOTE — Assessment & Plan Note (Signed)
Pt unable to exercise due to dizziness when starting Also facial flushing and general malaise (no cp or sob)

## 2015-03-11 NOTE — Progress Notes (Signed)
Pre visit review using our clinic review tool, if applicable. No additional management support is needed unless otherwise documented below in the visit note. 

## 2015-03-11 NOTE — Assessment & Plan Note (Signed)
Reviewed health habits including diet and exercise and skin cancer prevention Reviewed appropriate screening tests for age  Also reviewed health mt list, fam hx and immunization status , as well as social and family history   Labs rev  See HPI She will schedule own mammogram/ has dense breasts  Will f/u later for ear flush for cerumen

## 2015-03-11 NOTE — Assessment & Plan Note (Signed)
Routine exam and pap done today  No c/o  Still regular menses

## 2015-03-12 LAB — CYTOLOGY - PAP

## 2015-03-16 NOTE — Assessment & Plan Note (Signed)
bp in fair control at this time  BP Readings from Last 1 Encounters:  03/11/15 130/85   No changes needed Disc lifstyle change with low sodium diet and exercise  Labs reviewed

## 2015-05-07 ENCOUNTER — Ambulatory Visit (INDEPENDENT_AMBULATORY_CARE_PROVIDER_SITE_OTHER): Payer: No Typology Code available for payment source | Admitting: Interventional Cardiology

## 2015-05-07 ENCOUNTER — Encounter: Payer: Self-pay | Admitting: Interventional Cardiology

## 2015-05-07 VITALS — BP 128/90 | HR 77 | Ht 68.5 in | Wt 186.0 lb

## 2015-05-07 DIAGNOSIS — R0602 Shortness of breath: Secondary | ICD-10-CM | POA: Diagnosis not present

## 2015-05-07 DIAGNOSIS — R002 Palpitations: Secondary | ICD-10-CM | POA: Diagnosis not present

## 2015-05-07 DIAGNOSIS — R6889 Other general symptoms and signs: Secondary | ICD-10-CM

## 2015-05-07 DIAGNOSIS — Z8249 Family history of ischemic heart disease and other diseases of the circulatory system: Secondary | ICD-10-CM

## 2015-05-07 DIAGNOSIS — E785 Hyperlipidemia, unspecified: Secondary | ICD-10-CM | POA: Diagnosis not present

## 2015-05-07 NOTE — Patient Instructions (Signed)
Medication Instructions:  None  Labwork: None  Testing/Procedures: Your physician has requested that you have a stress echocardiogram. For further information please visit [email or web address]. Please follow instruction sheet as given.   Follow-Up: Your physician recommends that you schedule a follow-up appointment as needed with Dr. Irish Lack.  Any Other Special Instructions Will Be Listed Below (If Applicable).

## 2015-05-07 NOTE — Progress Notes (Signed)
Patient ID: Veronica [name], female   DOB: 19-Apr-1961, 54 y.o.   MRN: [id number]     Cardiology Office Note   Date:  05/07/2015   ID:  Veronica [name], DOB 03/03/1961, MRN [id number]  PCP:  Loura Pardon, MD    No chief complaint on file. exercise induced dizziness   Wt Readings from Last 3 Encounters:  05/07/15 186 lb (84.369 kg)  03/11/15 184 lb (83.462 kg)  08/27/14 194 lb (87.998 kg)       History of Present Illness: Veronica [name] is a 54 y.o. female  Who has had HTN and hyperlipidemia.  She has never been someone who did a lot of exercise.  In the past year, she bought a treadmill and was able to walk 20 minutes at a time.  She walks at a slow pace.  Her exercise tolerance has gotten worse.  She feels that 15 minutes feels like she i spushing herself.  She does not feel CP.  She is just fatigued.  She has no energy.  She wonders if it is her age or stress or lack of exercise.  SHe has gained weight in the few years.  About 30 lbs in the past couple of years.   Her father had angina in his late 57s.  He died at 9 y/o. Several aunts who had stents placed.     Her cholesterol has been high and she has tried a strict diet.      Past Medical History  Diagnosis Date  . PMS (premenstrual syndrome)   . Depression   . Hypertension   . Edema   . Colonic mass 1/10  . Acne   . Crohn's disease   . Hyperlipidemia     Past Surgical History  Procedure Laterality Date  . Hemicolectomy  1/10     Current Outpatient Prescriptions  Medication Sig Dispense Refill  . clonazePAM (KLONOPIN) 0.5 MG tablet Take one by mouth as needed     . losartan (COZAAR) 50 MG tablet TAKE 1 TABLET (50 MG TOTAL) BY MOUTH DAILY. 90 tablet 3  . metoprolol succinate (TOPROL-XL) 25 MG 24 hr tablet TAKE 1 TABLET (25 MG TOTAL) BY MOUTH DAILY. 90 tablet 3  . sertraline (ZOLOFT) 50 MG tablet Take 25 mg by mouth daily.     Marland Kitchen spironolactone (ALDACTONE) 25 MG tablet Take 0.5 tablets (12.5 mg total) by  mouth daily. 90 tablet 3  . [DISCONTINUED] zolpidem (AMBIEN) 10 MG tablet Take 10 mg by mouth at bedtime as needed.       No current facility-administered medications for this visit.    Allergies:   Hydrochlorothiazide; Ace inhibitors; Fluoxetine hcl; Losartan; Tenormin; Venlafaxine; and Bupropion hcl    Social History:  The patient  reports that she has never smoked. She has never used smokeless tobacco. She reports that she does not drink alcohol or use illicit drugs.   Family History:  The patient's *family history includes Alcohol abuse in her brother and son; Aneurysm in her father and paternal grandmother; Breast cancer in her mother; Colon cancer in her mother; Coronary artery disease in her maternal grandfather and paternal grandfather; Depression in her other; Diabetes in her paternal grandfather; Drug abuse in her son; Hypertension in her paternal grandfather and paternal grandmother; Pulmonary embolism in her father.  As above   ROS:  Please see the history of present illness.   Otherwise, review of systems are positive for fatigue, some DOE.   All other systems  are reviewed and negative.    PHYSICAL EXAM: VS:  BP 128/90 mmHg  Pulse 77  Ht 5' 8.5" (1.74 m)  Wt 186 lb (84.369 kg)  BMI 27.87 kg/m2 , BMI Body mass index is 27.87 kg/(m^2). GEN: Well nourished, well developed, in no acute distress HEENT: normal Neck: no JVD, carotid bruits, or masses Cardiac: RRR; no murmurs, rubs, or gallops,no edema  Respiratory:  clear to auscultation bilaterally, normal work of breathing GI: soft, nontender, nondistended, + BS MS: no deformity or atrophy Skin: warm and dry, no rash Neuro:  Strength and sensation are intact Psych: euthymic mood, full affect   EKG:   The ekg ordered today demonstrates normal Sinus rhtyhm   Recent Labs: 03/04/2015: ALT 31; BUN 10; Creatinine, Ser 0.78; Hemoglobin 12.4; Platelets 377.0; Potassium 3.9; Sodium 138; TSH 2.12   Lipid Panel    Component  Value Date/Time   CHOL 277* 03/04/2015 0855   TRIG 220.0* 03/04/2015 0855   HDL 39.60 03/04/2015 0855   CHOLHDL 7 03/04/2015 0855   VLDL 44.0* 03/04/2015 0855   LDLCALC 243* 03/01/2014 0758   LDLDIRECT 213.0 03/04/2015 0855     Other studies Reviewed: Additional studies/ records that were reviewed today with results demonstrating: reviewed PMDs note.   ASSESSMENT AND PLAN:  1. Fatigue /DOE:  Unclear etiology. Will check stress echocardiogram to evaluate her exercise tolerance and see what is happening with her heart when she does try to exercise. She has not exercised a lot during her adult life. Some of this may be deconditioning. Given her family history of coronary disease, exercise will be important for her. In order to give an exercise prescription, the stress test will be helpful. 2. Hyperlipidemia: Very high LDL. I recommended that she start on a statin. She states that her primary care doctor recommended the same but the patient was reluctant. Atorvastatin 10 mg daily would likely be enough to get her LDL significantly down. 3. Family history of early coronary artery disease: Her father had angina at a young age. We talked about the importance of prevention. She wants to come off of medication if possible. I think exercise and weight loss will be important for her to try to come off her antihypertensives. At this point, her blood pressure is well controlled. Continue current medicines.I would be curious to see if she would feel any better if she stopped her metoprolol. She will be off the metoprolol for the stress test. We'll see if there is any change.  She stated she had tachycardia and palpitations in the past. Metoprolol has helped with these.   Current medicines are reviewed at length with the patient today.  The patient concerns regarding her medicines were addressed.  The following changes have been made:  No change  Labs/ tests ordered today include:   Orders Placed This  Encounter  Procedures  . EKG 12-Lead  . Echo stress    Recommend 150 minutes/week of aerobic exercise Low fat, low carb, high fiber diet recommended  Disposition:   FU in for stress test   Teresita Madura., MD  05/07/2015 5:56 PM    [facility] [address], [place], [place]  [address] Phone: [phone]; Fax: [phone]

## 2015-05-13 ENCOUNTER — Telehealth (HOSPITAL_COMMUNITY): Payer: Self-pay | Admitting: *Deleted

## 2015-05-13 NOTE — Telephone Encounter (Signed)
Left message on voicemail in reference to upcoming appointment scheduled for 05/15/15. Phone number given for a call back so details instructions can be given. Veronica [name], [name]

## 2015-05-13 NOTE — Telephone Encounter (Signed)
Patient given detailed instructions per Stress Test Requisition Sheet for test on 05/15/15 at 7:30.Patient Notified to arrive 30 minutes early, and that it is imperative to arrive on time for appointment to keep from having the test rescheduled.  Patient verbalized understanding. Veronia Beets

## 2015-05-15 ENCOUNTER — Ambulatory Visit (HOSPITAL_COMMUNITY): Payer: No Typology Code available for payment source | Attending: Cardiovascular Disease

## 2015-05-15 DIAGNOSIS — I10 Essential (primary) hypertension: Secondary | ICD-10-CM | POA: Insufficient documentation

## 2015-05-15 DIAGNOSIS — R0602 Shortness of breath: Secondary | ICD-10-CM | POA: Diagnosis not present

## 2015-05-15 DIAGNOSIS — E785 Hyperlipidemia, unspecified: Secondary | ICD-10-CM | POA: Insufficient documentation

## 2015-06-24 ENCOUNTER — Other Ambulatory Visit: Payer: Self-pay

## 2015-06-24 MED ORDER — SERTRALINE HCL 50 MG PO TABS: 25.0000 mg | ORAL_TABLET | Freq: Every day | ORAL | Status: DC

## 2015-06-24 NOTE — Telephone Encounter (Signed)
Pt left v/m requesting refill sertraline to [facility]; pt thinks she has lost sertraline rx. I cannot find where Dr Glori Bickers has prescribed the sertraline. Last annual exam on 03/11/15.Please advise.

## 2015-06-24 NOTE — Telephone Encounter (Signed)
I don't see that either  If she still sees a psychiatrist it needs to come from them  If not - refill for a year please

## 2015-06-24 NOTE — Telephone Encounter (Signed)
Pt's psychiatrist retired a few months ago so she doesn't have one. Rx sent to pharmacy and pt notified

## 2015-07-28 ENCOUNTER — Telehealth: Payer: Self-pay

## 2015-07-28 NOTE — Telephone Encounter (Signed)
I left a voicemail reminding patient that she was due for a mammogram.

## 2016-03-07 ENCOUNTER — Telehealth: Payer: Self-pay | Admitting: Family Medicine

## 2016-03-07 DIAGNOSIS — I10 Essential (primary) hypertension: Secondary | ICD-10-CM

## 2016-03-07 DIAGNOSIS — Z0000 Encounter for general adult medical examination without abnormal findings: Secondary | ICD-10-CM

## 2016-03-07 NOTE — Telephone Encounter (Signed)
-----   Message from Marchia Bond sent at 03/02/2016 11:14 AM EDT ----- Regarding: Cpx labs Mon 5/1, need orders. Thanks! :-) Please order  future cpx labs for pt's upcoming lab appt. Thanks Aniceto Boss

## 2016-03-08 ENCOUNTER — Other Ambulatory Visit: Payer: No Typology Code available for payment source

## 2016-03-12 ENCOUNTER — Encounter: Payer: No Typology Code available for payment source | Admitting: Family Medicine

## 2016-03-21 ENCOUNTER — Other Ambulatory Visit: Payer: Self-pay | Admitting: Family Medicine

## 2016-04-04 ENCOUNTER — Other Ambulatory Visit: Payer: Self-pay | Admitting: Family Medicine

## 2016-06-26 ENCOUNTER — Other Ambulatory Visit: Payer: Self-pay | Admitting: Family Medicine

## 2016-08-24 ENCOUNTER — Other Ambulatory Visit (INDEPENDENT_AMBULATORY_CARE_PROVIDER_SITE_OTHER): Payer: 59

## 2016-08-24 DIAGNOSIS — R7989 Other specified abnormal findings of blood chemistry: Secondary | ICD-10-CM

## 2016-08-24 DIAGNOSIS — Z0000 Encounter for general adult medical examination without abnormal findings: Secondary | ICD-10-CM | POA: Diagnosis not present

## 2016-08-24 LAB — CBC WITH DIFFERENTIAL/PLATELET
Basophils Absolute: 0.1 10*3/uL (ref 0.0–0.1)
Basophils Relative: 0.7 % (ref 0.0–3.0)
EOS PCT: 3.2 % (ref 0.0–5.0)
Eosinophils Absolute: 0.3 10*3/uL (ref 0.0–0.7)
HCT: 36.5 % (ref 36.0–46.0)
HEMOGLOBIN: 12.3 g/dL (ref 12.0–15.0)
Lymphocytes Relative: 26.9 % (ref 12.0–46.0)
Lymphs Abs: 2.5 10*3/uL (ref 0.7–4.0)
MCHC: 33.7 g/dL (ref 30.0–36.0)
MCV: 83.9 fl (ref 78.0–100.0)
MONO ABS: 0.6 10*3/uL (ref 0.1–1.0)
MONOS PCT: 5.9 % (ref 3.0–12.0)
Neutro Abs: 5.9 10*3/uL (ref 1.4–7.7)
Neutrophils Relative %: 63.3 % (ref 43.0–77.0)
Platelets: 423 10*3/uL — ABNORMAL HIGH (ref 150.0–400.0)
RBC: 4.35 Mil/uL (ref 3.87–5.11)
RDW: 14.4 % (ref 11.5–15.5)
WBC: 9.4 10*3/uL (ref 4.0–10.5)

## 2016-08-24 LAB — LIPID PANEL
Cholesterol: 303 mg/dL — ABNORMAL HIGH (ref 0–200)
HDL: 45.9 mg/dL (ref >39.00)
NonHDL: 257.3
Total CHOL/HDL Ratio: 7
Triglycerides: 397 mg/dL — ABNORMAL HIGH (ref 0.0–149.0)
VLDL: 79.4 mg/dL — ABNORMAL HIGH (ref 0.0–40.0)

## 2016-08-24 LAB — COMPREHENSIVE METABOLIC PANEL
ALBUMIN: 3.8 g/dL (ref 3.5–5.2)
ALK PHOS: 120 U/L — AB (ref 39–117)
ALT: 21 U/L (ref 0–35)
AST: 18 U/L (ref 0–37)
BILIRUBIN TOTAL: 0.3 mg/dL (ref 0.2–1.2)
BUN: 10 mg/dL (ref 6–23)
CO2: 27 mEq/L (ref 19–32)
CREATININE: 0.76 mg/dL (ref 0.40–1.20)
Calcium: 9.1 mg/dL (ref 8.4–10.5)
Chloride: 104 mEq/L (ref 96–112)
GFR: 84.4 mL/min (ref >60.00)
Glucose, Bld: 103 mg/dL — ABNORMAL HIGH (ref 70–99)
POTASSIUM: 3.8 meq/L (ref 3.5–5.1)
SODIUM: 140 meq/L (ref 135–145)
TOTAL PROTEIN: 7 g/dL (ref 6.0–8.3)

## 2016-08-24 LAB — LDL CHOLESTEROL, DIRECT: LDL DIRECT: 195 mg/dL

## 2016-08-24 LAB — TSH: TSH: 4.4 u[IU]/mL (ref 0.35–4.50)

## 2016-08-31 ENCOUNTER — Other Ambulatory Visit (HOSPITAL_COMMUNITY)
Admission: RE | Admit: 2016-08-31 | Discharge: 2016-08-31 | Disposition: A | Discharge status: Home / Self Care | Payer: 59 | Source: Ambulatory Visit | Attending: Family Medicine | Admitting: Family Medicine

## 2016-08-31 ENCOUNTER — Ambulatory Visit (INDEPENDENT_AMBULATORY_CARE_PROVIDER_SITE_OTHER): Payer: 59 | Admitting: Family Medicine

## 2016-08-31 ENCOUNTER — Encounter: Payer: Self-pay | Admitting: Family Medicine

## 2016-08-31 VITALS — BP 130/85 | HR 100 | Temp 98.3°F | Ht 68.5 in | Wt 202.0 lb

## 2016-08-31 DIAGNOSIS — Z0000 Encounter for general adult medical examination without abnormal findings: Secondary | ICD-10-CM | POA: Diagnosis not present

## 2016-08-31 DIAGNOSIS — Z23 Encounter for immunization: Secondary | ICD-10-CM | POA: Diagnosis not present

## 2016-08-31 DIAGNOSIS — Z01419 Encounter for gynecological examination (general) (routine) without abnormal findings: Secondary | ICD-10-CM | POA: Diagnosis not present

## 2016-08-31 DIAGNOSIS — Z124 Encounter for screening for malignant neoplasm of cervix: Secondary | ICD-10-CM

## 2016-08-31 DIAGNOSIS — R7309 Other abnormal glucose: Secondary | ICD-10-CM

## 2016-08-31 DIAGNOSIS — I10 Essential (primary) hypertension: Secondary | ICD-10-CM

## 2016-08-31 DIAGNOSIS — E6609 Other obesity due to excess calories: Secondary | ICD-10-CM

## 2016-08-31 DIAGNOSIS — Z01411 Encounter for gynecological examination (general) (routine) with abnormal findings: Secondary | ICD-10-CM | POA: Diagnosis not present

## 2016-08-31 DIAGNOSIS — E7800 Pure hypercholesterolemia, unspecified: Secondary | ICD-10-CM | POA: Diagnosis not present

## 2016-08-31 DIAGNOSIS — Z6830 Body mass index (BMI) 30.0-30.9, adult: Secondary | ICD-10-CM

## 2016-08-31 DIAGNOSIS — R7303 Prediabetes: Secondary | ICD-10-CM | POA: Insufficient documentation

## 2016-08-31 DIAGNOSIS — E66811 Obesity, class 1: Secondary | ICD-10-CM

## 2016-08-31 DIAGNOSIS — Z8249 Family history of ischemic heart disease and other diseases of the circulatory system: Secondary | ICD-10-CM

## 2016-08-31 MED ORDER — SERTRALINE HCL 50 MG PO TABS: 25.0000 mg | ORAL_TABLET | Freq: Every day | ORAL | 3 refills | Status: DC

## 2016-08-31 MED ORDER — ROSUVASTATIN CALCIUM 10 MG PO TABS: 10.0000 mg | ORAL_TABLET | Freq: Every day | ORAL | 11 refills | Status: DC

## 2016-08-31 MED ORDER — SPIRONOLACTONE 25 MG PO TABS: ORAL_TABLET | ORAL | 3 refills | Status: DC

## 2016-08-31 MED ORDER — LOSARTAN POTASSIUM 50 MG PO TABS: ORAL_TABLET | ORAL | 3 refills | Status: DC

## 2016-08-31 NOTE — Patient Instructions (Addendum)
Call your insurance co and tell them you are having headache and that you have a strong family history of cerebral aneurysm - see if they will pay for MRI/MRA - and let me know  SCHEDULE YOUR MAMMOGRAM! Pap done today  Start crestor 10 mg daily -take it optimally in the evening with a low fat snack  Watch diet for cholesterol     Avoid red meat/ fried foods/ egg yolks/ fatty breakfast meats/ butter, cheese and high fat dairy/ and shellfish   Start an exercise program when you can  Talk to your employer about work load and need for time for self care  Start watching your blood pressure at home   Schedule follow up labs for cholesterol and blood sugar in 6 weeks

## 2016-08-31 NOTE — Progress Notes (Signed)
Subjective:    Patient ID: Veronica [name], female    DOB: 01-17-1961, 55 y.o.   MRN: [id number]  HPI Here for health maintenance exam and to review chronic medical problems    Doing ok  Has been a rough year- lot of deaths in the family , her 33 yo daughter moved home  Husband lost her job  Getting through  She has a new boss -working much longer hours    Wt Readings from Last 3 Encounters:  08/31/16 202 lb (91.6 kg)  05/07/15 186 lb (84.4 kg)  03/11/15 184 lb (83.5 kg)  bmi is 30.2 Not eating right  Not getting enough exercise  No time for self care    Mammogram 2/13- had repeat views that were negative - she will make her own appt.  Self breast exam -no lumps or changes  Mother had breast cancer   Flu shot- getting today   Pap 5/16- ascus with neg HPV test  Need to re check today   Tetanus shot 1/13  Colonoscopy 8/15- 5 year recall  Mother had colon cancer  Pt has personal hx of hemicolectomy due to a mass   Hep C screen neg 10/09  bp is up on first check today  No cp or palpitations or headaches or edema  No side effects to medicines  BP Readings from Last 3 Encounters:  08/31/16 (!) 144/92  05/07/15 128/90  03/11/15 130/85   she is compliant with medicine   BP: 130/85 --improved on 2nd check    Hx of hyperlipidemia Lab Results  Component Value Date   CHOL 303 (H) 08/24/2016   CHOL 277 (H) 03/04/2015   CHOL 317 (H) 08/20/2014   Lab Results  Component Value Date   HDL 45.90 08/24/2016   HDL 39.60 03/04/2015   HDL 37.90 (L) 08/20/2014   Lab Results  Component Value Date   LDLCALC 243 (H) 03/01/2014   Lab Results  Component Value Date   TRIG 397.0 (H) 08/24/2016   TRIG 220.0 (H) 03/04/2015   TRIG 258.0 (H) 08/20/2014   Lab Results  Component Value Date   CHOLHDL 7 08/24/2016   CHOLHDL 7 03/04/2015   CHOLHDL 8 08/20/2014   Lab Results  Component Value Date   LDLDIRECT 195.0 08/24/2016   LDLDIRECT 213.0 03/04/2015   LDLDIRECT  217.5 08/20/2014  she is ready  Will start with statin medication  Diet was better prev (plant based)-is back to convenience eating     Results for orders placed or performed in visit on 08/24/16  CBC with Differential/Platelet  Result Value Ref Range   WBC 9.4 4.0 - 10.5 K/uL   RBC 4.35 3.87 - 5.11 Mil/uL   Hemoglobin 12.3 12.0 - 15.0 g/dL   HCT 36.5 36.0 - 46.0 %   MCV 83.9 78.0 - 100.0 fl   MCHC 33.7 30.0 - 36.0 g/dL   RDW 14.4 11.5 - 15.5 %   Platelets 423.0 (H) 150.0 - 400.0 K/uL   Neutrophils Relative % 63.3 43.0 - 77.0 %   Lymphocytes Relative 26.9 12.0 - 46.0 %   Monocytes Relative 5.9 3.0 - 12.0 %   Eosinophils Relative 3.2 0.0 - 5.0 %   Basophils Relative 0.7 0.0 - 3.0 %   Neutro Abs 5.9 1.4 - 7.7 K/uL   Lymphs Abs 2.5 0.7 - 4.0 K/uL   Monocytes Absolute 0.6 0.1 - 1.0 K/uL   Eosinophils Absolute 0.3 0.0 - 0.7 K/uL   Basophils Absolute 0.1  0.0 - 0.1 K/uL  Comprehensive metabolic panel  Result Value Ref Range   Sodium 140 135 - 145 mEq/L   Potassium 3.8 3.5 - 5.1 mEq/L   Chloride 104 96 - 112 mEq/L   CO2 27 19 - 32 mEq/L   Glucose, Bld 103 (H) 70 - 99 mg/dL   BUN 10 6 - 23 mg/dL   Creatinine, Ser 0.76 0.40 - 1.20 mg/dL   Total Bilirubin 0.3 0.2 - 1.2 mg/dL   Alkaline Phosphatase 120 (H) 39 - 117 U/L   AST 18 0 - 37 U/L   ALT 21 0 - 35 U/L   Total Protein 7.0 6.0 - 8.3 g/dL   Albumin 3.8 3.5 - 5.2 g/dL   Calcium 9.1 8.4 - 10.5 mg/dL   GFR 84.40 >60.00 mL/min  TSH  Result Value Ref Range   TSH 4.40 0.35 - 4.50 uIU/mL  Lipid panel  Result Value Ref Range   Cholesterol 303 (H) 0 - 200 mg/dL   Triglycerides 397.0 (H) 0.0 - 149.0 mg/dL   HDL 45.90 >39.00 mg/dL   VLDL 79.4 (H) 0.0 - 40.0 mg/dL   Total CHOL/HDL Ratio 7    NonHDL 257.30   LDL cholesterol, direct  Result Value Ref Range   Direct LDL 195.0 mg/dL      Review of Systems Review of Systems  Constitutional: Negative for fever, appetite change, fatigue and unexpected weight change.  Eyes:  Negative for pain and visual disturbance.  Respiratory: Negative for cough and shortness of breath.   Cardiovascular: Negative for cp or palpitations    Gastrointestinal: Negative for nausea, diarrhea and constipation.  Genitourinary: Negative for urgency and frequency. neg for excess thirst  Skin: Negative for pallor or rash   Neurological: Negative for weakness, light-headedness, numbness and pos for headaches.  Hematological: Negative for adenopathy. Does not bruise/bleed easily.  Psychiatric/Behavioral: Negative for dysphoric mood. The patient is not nervous/anxious. Pos for stressors         Objective:   Physical Exam  Constitutional: She appears well-developed and well-nourished. No distress.  obese and well appearing   HENT:  Head: Normocephalic and atraumatic.  Right Ear: External ear normal.  Left Ear: External ear normal.  Mouth/Throat: Oropharynx is clear and moist.  Eyes: Conjunctivae and EOM are normal. Pupils are equal, round, and reactive to light. No scleral icterus.  Neck: Normal range of motion. Neck supple. No JVD present. Carotid bruit is not present. No thyromegaly present.  Cardiovascular: Normal rate, regular rhythm, normal heart sounds and intact distal pulses.  Exam reveals no gallop.   Pulmonary/Chest: Effort normal and breath sounds normal. No respiratory distress. She has no wheezes. She exhibits no tenderness.  Abdominal: Soft. Bowel sounds are normal. She exhibits no distension, no abdominal bruit and no mass. There is no tenderness.  Genitourinary: No breast swelling, tenderness, discharge or bleeding.  Genitourinary Comments: Breast exam: No mass, nodules, thickening, tenderness, bulging, retraction, inflamation, nipple discharge or skin changes noted.  No axillary or clavicular LA.             Anus appears normal w/o hemorrhoids or masses     External genitalia : nl appearance and hair distribution/no lesions     Urethral meatus : nl size, no  lesions or prolapse     Urethra: no masses, tenderness or scarring    Bladder : no masses or tenderness     Vagina: nl general appearance, no discharge or  Lesions, no significant cystocele  or  rectocele     Cervix: no lesions/ discharge or friability    Uterus: nl size, contour, position, and mobility (not fixed) , non tender    Adnexa : no masses, tenderness, enlargement or nodularity        Musculoskeletal: Normal range of motion. She exhibits no edema or tenderness.  Lymphadenopathy:    She has no cervical adenopathy.  Neurological: She is alert. She has normal reflexes. No cranial nerve deficit. She exhibits normal muscle tone. Coordination normal.  Skin: Skin is warm and dry. No rash noted. No erythema. No pallor.  Lentigines noted Fair complexion   Psychiatric: She has a normal mood and affect.          Assessment & Plan:   Problem List Items Addressed This Visit      Cardiovascular and Mediastinum   Essential hypertension - Primary    bp in fair control at this time  BP Readings from Last 1 Encounters:  08/31/16 130/85   No changes needed Disc lifstyle change with low sodium diet and exercise  Better on 2nd check       Relevant Medications   rosuvastatin (CRESTOR) 10 MG tablet   spironolactone (ALDACTONE) 25 MG tablet   losartan (COZAAR) 50 MG tablet   Family history of cerebral aneurysm    Pt will call ins to see if MRI/ MRA is covered for screening       Relevant Medications   rosuvastatin (CRESTOR) 10 MG tablet   spironolactone (ALDACTONE) 25 MG tablet   losartan (COZAAR) 50 MG tablet     Other   Elevated glucose    Mild  Will check A1C at f/u lab Rev low glycemic diet  Wt loss also to prevent DM      Encounter for routine gynecological examination    Last pap was ascus with neg hpv  Overdue for year f/u pap Done today  Still having somewhat regular menses-some hot flashes        Relevant Orders   Cytology - PAP    Hyperlipidemia    Disc goals for lipids and reasons to control them Rev labs with pt Rev low sat fat diet in detail She is agreeable to try statin now  crestor 10 mg daily and then titrate to goal  Disc poss side eff / muscle aches- will update  Strongly enc a lower fat diet       Relevant Medications   rosuvastatin (CRESTOR) 10 MG tablet   spironolactone (ALDACTONE) 25 MG tablet   losartan (COZAAR) 50 MG tablet   Obesity    Discussed how this problem influences overall health and the risks it imposes  Reviewed plan for weight loss with lower calorie diet (via better food choices and also portion control or program like weight watchers) and exercise building up to or more than 30 minutes 5 days per week including some aerobic activity         Routine general medical examination at a health care facility    Reviewed health habits including diet and exercise and skin cancer prevention Reviewed appropriate screening tests for age  Also reviewed health mt list, fam hx and immunization status , as well as social and family history   See HPI No time for self care lately Call your insurance co and tell them you are having headache and that you have a strong family history of cerebral aneurysm - see if they will pay for MRI/MRA - and let me know  SCHEDULE YOUR MAMMOGRAM! Pap done today  Start crestor 10 mg daily -take it optimally in the evening with a low fat snack  Watch diet for cholesterol     Avoid red meat/ fried foods/ egg yolks/ fatty breakfast meats/ butter, cheese and high fat dairy/ and shellfish   Start an exercise program when you can  Talk to your employer about work load and need for time for self care  Start watching your blood pressure at home        Other Visit Diagnoses    Need for influenza vaccination       Relevant Orders   Flu Vaccine QUAD 36+ mos IM (Completed)

## 2016-08-31 NOTE — Progress Notes (Signed)
Pre visit review using our clinic review tool, if applicable. No additional management support is needed unless otherwise documented below in the visit note. 

## 2016-09-01 DIAGNOSIS — Z8249 Family history of ischemic heart disease and other diseases of the circulatory system: Secondary | ICD-10-CM | POA: Insufficient documentation

## 2016-09-01 DIAGNOSIS — E669 Obesity, unspecified: Secondary | ICD-10-CM | POA: Insufficient documentation

## 2016-09-01 NOTE — Assessment & Plan Note (Signed)
Mild  Will check A1C at f/u lab Rev low glycemic diet  Wt loss also to prevent DM

## 2016-09-01 NOTE — Assessment & Plan Note (Signed)
Pt will call ins to see if MRI/ MRA is covered for screening

## 2016-09-01 NOTE — Assessment & Plan Note (Signed)
Last pap was ascus with neg hpv  Overdue for year f/u pap Done today  Still having somewhat regular menses-some hot flashes

## 2016-09-01 NOTE — Assessment & Plan Note (Signed)
Disc goals for lipids and reasons to control them Rev labs with pt Rev low sat fat diet in detail She is agreeable to try statin now  crestor 10 mg daily and then titrate to goal  Disc poss side eff / muscle aches- will update  Strongly enc a lower fat diet

## 2016-09-01 NOTE — Assessment & Plan Note (Signed)
Discussed how this problem influences overall health and the risks it imposes  Reviewed plan for weight loss with lower calorie diet (via better food choices and also portion control or program like weight watchers) and exercise building up to or more than 30 minutes 5 days per week including some aerobic activity    

## 2016-09-01 NOTE — Assessment & Plan Note (Signed)
bp in fair control at this time  BP Readings from Last 1 Encounters:  08/31/16 130/85   No changes needed Disc lifstyle change with low sodium diet and exercise  Better on 2nd check

## 2016-09-01 NOTE — Assessment & Plan Note (Signed)
Reviewed health habits including diet and exercise and skin cancer prevention Reviewed appropriate screening tests for age  Also reviewed health mt list, fam hx and immunization status , as well as social and family history   See HPI No time for self care lately Call your insurance co and tell them you are having headache and that you have a strong family history of cerebral aneurysm - see if they will pay for MRI/MRA - and let me know  SCHEDULE YOUR MAMMOGRAM! Pap done today  Start crestor 10 mg daily -take it optimally in the evening with a low fat snack  Watch diet for cholesterol     Avoid red meat/ fried foods/ egg yolks/ fatty breakfast meats/ butter, cheese and high fat dairy/ and shellfish   Start an exercise program when you can  Talk to your employer about work load and need for time for self care  Start watching your blood pressure at home

## 2016-09-03 LAB — CYTOLOGY - PAP: Diagnosis: NEGATIVE

## 2016-10-15 ENCOUNTER — Other Ambulatory Visit: Payer: 59

## 2016-10-21 ENCOUNTER — Other Ambulatory Visit (INDEPENDENT_AMBULATORY_CARE_PROVIDER_SITE_OTHER): Payer: 59

## 2016-10-21 DIAGNOSIS — E7800 Pure hypercholesterolemia, unspecified: Secondary | ICD-10-CM | POA: Diagnosis not present

## 2016-10-21 DIAGNOSIS — R7309 Other abnormal glucose: Secondary | ICD-10-CM

## 2016-10-21 LAB — LIPID PANEL
Cholesterol: 179 mg/dL (ref 0–200)
HDL: 44.7 mg/dL (ref >39.00)
LDL Cholesterol: 101 mg/dL — ABNORMAL HIGH (ref 0–99)
NONHDL: 134.37
TRIGLYCERIDES: 168 mg/dL — AB (ref 0.0–149.0)
Total CHOL/HDL Ratio: 4
VLDL: 33.6 mg/dL (ref 0.0–40.0)

## 2016-10-21 LAB — ALT: ALT: 29 U/L (ref 0–35)

## 2016-10-21 LAB — HEMOGLOBIN A1C: HEMOGLOBIN A1C: 6 % (ref 4.6–6.5)

## 2016-10-21 LAB — AST: AST: 18 U/L (ref 0–37)

## 2016-10-27 ENCOUNTER — Encounter: Payer: Self-pay | Admitting: Internal Medicine

## 2016-10-27 ENCOUNTER — Ambulatory Visit (INDEPENDENT_AMBULATORY_CARE_PROVIDER_SITE_OTHER): Payer: 59 | Admitting: Internal Medicine

## 2016-10-27 ENCOUNTER — Ambulatory Visit: Payer: 59 | Admitting: Medical

## 2016-10-27 VITALS — BP 140/80 | HR 96 | Wt 204.0 lb

## 2016-10-27 DIAGNOSIS — H6123 Impacted cerumen, bilateral: Secondary | ICD-10-CM | POA: Diagnosis not present

## 2016-10-27 DIAGNOSIS — H60392 Other infective otitis externa, left ear: Secondary | ICD-10-CM

## 2016-10-27 MED ORDER — CIPROFLOXACIN-DEXAMETHASONE 0.3-0.1 % OT SUSP: 4.0000 [drp] | Freq: Two times a day (BID) | OTIC | 0 refills | Status: DC

## 2016-10-27 NOTE — Progress Notes (Signed)
Subjective:    Patient ID: Veronica [name], female    DOB: December 07, 1960, 55 y.o.   MRN: [id number]  HPI  Pt presents to the clinic today with c/o runny nose, ear pain and cough. This started 1 week ago. She is blowing clear mucous out of her nose. She describes the ear pain as sharp and stabbing. She denies decreased hearing. The cough is non productive. She denies fever, chills or body aches. She knows she has issues with wax buildup and has tried Debrox OTC without any relief. She has also tried Ibuprofen and Dayquil with minimal relief. She has no history of allergies or breathing problems. She has not had sick contacts.  Review of Systems  Past Medical History:  Diagnosis Date  . Acne   . Colonic mass 1/10  . Crohn's disease ([place])   . Depression   . Edema   . Hyperlipidemia   . Hypertension   . PMS (premenstrual syndrome)     Current Outpatient Prescriptions  Medication Sig Dispense Refill  . losartan (COZAAR) 50 MG tablet TAKE 1 TABLET (50 MG TOTAL) BY MOUTH DAILY. 90 tablet 3  . rosuvastatin (CRESTOR) 10 MG tablet Take 1 tablet (10 mg total) by mouth daily. 30 tablet 11  . sertraline (ZOLOFT) 50 MG tablet Take 0.5 tablets (25 mg total) by mouth daily. 45 tablet 3  . spironolactone (ALDACTONE) 25 MG tablet TAKE 1/2 TABLETS BY MOUTH DAILY. 45 tablet 3   No current facility-administered medications for this visit.     Allergies  Allergen Reactions  . Hydrochlorothiazide     Dizzy and felt faint  . Ace Inhibitors     cough  . Fluoxetine Hcl     REACTION: urticartia  . Losartan     Cough   . Tenormin [Atenolol]     fatigue  . Venlafaxine     REACTION: weight gain, makes sleepy  . Bupropion Hcl     REACTION: tinnitus previously but no other symptoms.  She tolerated it on second trial w/o return of tinnitus.      Family History  Problem Relation Age of Onset  . Colon cancer Mother   . Breast cancer Mother   . Alcohol abuse Brother   . Hypertension Paternal  Grandfather   . Diabetes Paternal Grandfather   . Coronary artery disease Paternal Grandfather   . Coronary artery disease Maternal Grandfather   . Aneurysm Paternal Grandmother     cerebral  . Hypertension Paternal Grandmother   . Pulmonary embolism Father   . Aneurysm Father     cerebral  . Alcohol abuse Son   . Drug abuse Son   . Depression Other     fam hx    Social History   Social History  . Marital status: Married    Spouse name: N/A  . Number of children: 2  . Years of education: N/A   Occupational History  . 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 Smoking status: Never Smoker  . Smokeless tobacco: Never Used  . Alcohol use No  . Drug use: No  . Sexual activity: Not on file   Other Topics Concern  . 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  HEENT: Pt reports runny nose, ear pain and wax buildup. Denies eye pain, eye redness, ringing in the ears, nasal congestion, bloody nose, or sore throat. Respiratory: Pt reports cough. Denies difficulty breathing, shortness of breath, or sputum production.    No other specific complaints in a complete review of systems (except as listed in HPI above).     Objective:   Physical Exam   BP 140/80   Pulse 96   Wt 204 lb (92.5 kg)   SpO2 96%   BMI 30.57 kg/m  Wt Readings from Last 3 Encounters:  10/27/16 204 lb (92.5 kg)  08/31/16 202 lb (91.6 kg)  05/07/15 186 lb (84.4 kg)    General: Appears her stated age, in NAD.d. HEENT: Head: normal shape and size, no sinus tenderness noted; Ears: bilateral cerumen impaction; Nose: mucosa pink and moist, septum midline; Throat/Mouth: Teeth present, mucosa pink  and moist, no exudate, lesions or ulcerations noted.  Neck:  No adenopathy noted. Cardiovascular: Normal rate and rhythm.  Pulmonary/Chest: Normal effort and positive vesicular breath sounds. No respiratory distress. No wheezes, rales or ronchi noted.    BMET    Component Value Date/Time   NA 140 08/24/2016 0758   K 3.8 08/24/2016 0758   CL 104 08/24/2016 0758   CO2 27 08/24/2016 0758   GLUCOSE 103 (H) 08/24/2016 0758   BUN 10 08/24/2016 0758   CREATININE 0.76 08/24/2016 0758   CREATININE 0.86 05/07/2011 1708   CALCIUM 9.1 08/24/2016 0758   GFRNONAA >60 11/12/2008 0510   GFRAA  11/12/2008 0510    >60        The eGFR has been calculated using the MDRD equation. This calculation has not been validated in all clinical situations. eGFR's persistently <60 mL/min signify possible Chronic Kidney Disease.    Lipid Panel     Component Value Date/Time   CHOL 179 10/21/2016 0840   TRIG 168.0 (H) 10/21/2016 0840   HDL 44.70 10/21/2016 0840   CHOLHDL 4 10/21/2016 0840   VLDL 33.6 10/21/2016 0840   LDLCALC 101 (H) 10/21/2016 0840    CBC    Component Value Date/Time   WBC 9.4 08/24/2016 0758   RBC 4.35 08/24/2016 0758   HGB 12.3 08/24/2016 0758   HCT 36.5 08/24/2016 0758   PLT 423.0 (H) 08/24/2016 0758   MCV 83.9 08/24/2016 0758   MCH 27.9 05/07/2011 1708   MCHC 33.7 08/24/2016 0758   RDW 14.4 08/24/2016 0758   LYMPHSABS 2.5 08/24/2016 0758   MONOABS 0.6 08/24/2016 0758   EOSABS 0.3 08/24/2016 0758   BASOSABS 0.1 08/24/2016 0758    Hgb A1C Lab Results  Component Value Date   HGBA1C 6.0 10/21/2016       Assessment & Plan:   Bilateral cerumen impaction:  Manual lavage by CMA Advised her to try Debrox OTC 2 x week to prevent buildup After cerumen removed, left ear canal red and swollen, concerning for otitis externa eRx for Cirprodex BID s 5 days Ibuprofen as needed for swelling and pain  RTC as needed or if symptoms persist or worsen. Webb Silversmith, NP

## 2016-10-27 NOTE — Patient Instructions (Signed)
Otitis Externa Otitis externa is a germ infection in the outer ear. The outer ear is the area from the eardrum to the outside of the ear. Otitis externa is sometimes called "swimmer's ear." HOME CARE  Put drops in the ear as told by your doctor.  Only take medicine as told by your doctor.  If you have diabetes, your doctor may give you more directions. Follow your doctor's directions.  Keep all doctor visits as told. To avoid another infection:  Keep your ear dry. Use the corner of a towel to dry your ear after swimming or bathing.  Avoid scratching or putting things inside your ear.  Avoid swimming in lakes, dirty water, or pools that use a chemical called chlorine poorly.  You may use ear drops after swimming. Combine equal amounts of white vinegar and alcohol in a bottle. Put 3 or 4 drops in each ear. GET HELP IF:   You have a fever.  Your ear is still red, puffy (swollen), or painful after 3 days.  You still have yellowish-white fluid (pus) coming from the ear after 3 days.  Your redness, puffiness, or pain gets worse.  You have a really bad headache.  You have redness, puffiness, pain, or tenderness behind your ear. MAKE SURE YOU:   Understand these instructions.  Will watch your condition.  Will get help right away if you are not doing well or get worse. This information is not intended to replace advice given to you by your health care provider. Make sure you discuss any questions you have with your health care provider. Document Released: 04/12/2008 Document Revised: 11/15/2014 Document Reviewed: 08/04/2015 Elsevier Interactive Patient Education  2017 [employer].

## 2016-10-29 ENCOUNTER — Encounter: Payer: Self-pay | Admitting: Internal Medicine

## 2016-10-29 ENCOUNTER — Other Ambulatory Visit: Payer: Self-pay | Admitting: Internal Medicine

## 2016-10-29 MED ORDER — AMOXICILLIN 875 MG PO TABS: 875.0000 mg | ORAL_TABLET | Freq: Two times a day (BID) | ORAL | 0 refills | Status: DC

## 2017-07-25 ENCOUNTER — Other Ambulatory Visit: Payer: Self-pay | Admitting: Family Medicine

## 2017-07-25 DIAGNOSIS — Z1231 Encounter for screening mammogram for malignant neoplasm of breast: Secondary | ICD-10-CM

## 2017-07-28 ENCOUNTER — Ambulatory Visit
Admission: RE | Admit: 2017-07-28 | Discharge: 2017-07-28 | Disposition: A | Discharge status: Home / Self Care | Payer: 59 | Source: Ambulatory Visit | Attending: Family Medicine | Admitting: Family Medicine

## 2017-07-28 DIAGNOSIS — Z1231 Encounter for screening mammogram for malignant neoplasm of breast: Secondary | ICD-10-CM | POA: Diagnosis not present

## 2017-08-21 ENCOUNTER — Telehealth: Payer: Self-pay | Admitting: Family Medicine

## 2017-08-21 DIAGNOSIS — R7309 Other abnormal glucose: Secondary | ICD-10-CM

## 2017-08-21 DIAGNOSIS — I10 Essential (primary) hypertension: Secondary | ICD-10-CM

## 2017-08-21 DIAGNOSIS — E7800 Pure hypercholesterolemia, unspecified: Secondary | ICD-10-CM

## 2017-08-21 NOTE — Telephone Encounter (Signed)
-----   Message from Ellamae Sia sent at 08/17/2017 10:57 AM EDT ----- Regarding: Lab orders for Friday, 10.19.18 Patient is scheduled for CPX labs, please order future labs, Thanks , Karna Christmas

## 2017-08-26 ENCOUNTER — Other Ambulatory Visit (INDEPENDENT_AMBULATORY_CARE_PROVIDER_SITE_OTHER): Payer: 59

## 2017-08-26 DIAGNOSIS — I10 Essential (primary) hypertension: Secondary | ICD-10-CM | POA: Diagnosis not present

## 2017-08-26 DIAGNOSIS — E7800 Pure hypercholesterolemia, unspecified: Secondary | ICD-10-CM | POA: Diagnosis not present

## 2017-08-26 DIAGNOSIS — R7309 Other abnormal glucose: Secondary | ICD-10-CM | POA: Diagnosis not present

## 2017-08-26 LAB — CBC WITH DIFFERENTIAL/PLATELET
Basophils Absolute: 0.1 10*3/uL (ref 0.0–0.1)
Basophils Relative: 0.8 % (ref 0.0–3.0)
EOS PCT: 3.3 % (ref 0.0–5.0)
Eosinophils Absolute: 0.3 10*3/uL (ref 0.0–0.7)
HEMATOCRIT: 39.4 % (ref 36.0–46.0)
HEMOGLOBIN: 12.8 g/dL (ref 12.0–15.0)
LYMPHS PCT: 31.7 % (ref 12.0–46.0)
Lymphs Abs: 2.4 10*3/uL (ref 0.7–4.0)
MCHC: 32.5 g/dL (ref 30.0–36.0)
MCV: 87.5 fl (ref 78.0–100.0)
MONOS PCT: 7.8 % (ref 3.0–12.0)
Monocytes Absolute: 0.6 10*3/uL (ref 0.1–1.0)
Neutro Abs: 4.4 10*3/uL (ref 1.4–7.7)
Neutrophils Relative %: 56.4 % (ref 43.0–77.0)
Platelets: 383 10*3/uL (ref 150.0–400.0)
RBC: 4.5 Mil/uL (ref 3.87–5.11)
RDW: 13.6 % (ref 11.5–15.5)
WBC: 7.7 10*3/uL (ref 4.0–10.5)

## 2017-08-26 LAB — COMPREHENSIVE METABOLIC PANEL
ALBUMIN: 4.1 g/dL (ref 3.5–5.2)
ALK PHOS: 131 U/L — AB (ref 39–117)
ALT: 25 U/L (ref 0–35)
AST: 19 U/L (ref 0–37)
BILIRUBIN TOTAL: 0.4 mg/dL (ref 0.2–1.2)
BUN: 11 mg/dL (ref 6–23)
CALCIUM: 9.7 mg/dL (ref 8.4–10.5)
CO2: 29 mEq/L (ref 19–32)
Chloride: 102 mEq/L (ref 96–112)
Creatinine, Ser: 0.89 mg/dL (ref 0.40–1.20)
GFR: 70.08 mL/min (ref >60.00)
GLUCOSE: 109 mg/dL — AB (ref 70–99)
Potassium: 3.9 mEq/L (ref 3.5–5.1)
Sodium: 140 mEq/L (ref 135–145)
TOTAL PROTEIN: 7.2 g/dL (ref 6.0–8.3)

## 2017-08-26 LAB — LDL CHOLESTEROL, DIRECT: Direct LDL: 111 mg/dL

## 2017-08-26 LAB — LIPID PANEL
CHOLESTEROL: 181 mg/dL (ref 0–200)
HDL: 42.6 mg/dL (ref >39.00)
NONHDL: 138.57
TRIGLYCERIDES: 220 mg/dL — AB (ref 0.0–149.0)
Total CHOL/HDL Ratio: 4
VLDL: 44 mg/dL — ABNORMAL HIGH (ref 0.0–40.0)

## 2017-08-26 LAB — TSH: TSH: 2.67 u[IU]/mL (ref 0.35–4.50)

## 2017-08-26 LAB — HEMOGLOBIN A1C: HEMOGLOBIN A1C: 6.2 % (ref 4.6–6.5)

## 2017-09-02 ENCOUNTER — Encounter: Payer: Self-pay | Admitting: Family Medicine

## 2017-09-02 ENCOUNTER — Ambulatory Visit (INDEPENDENT_AMBULATORY_CARE_PROVIDER_SITE_OTHER): Payer: 59 | Admitting: Family Medicine

## 2017-09-02 VITALS — BP 124/84 | HR 112 | Temp 98.3°F | Ht 68.5 in | Wt 201.0 lb

## 2017-09-02 DIAGNOSIS — Z6830 Body mass index (BMI) 30.0-30.9, adult: Secondary | ICD-10-CM | POA: Diagnosis not present

## 2017-09-02 DIAGNOSIS — R7303 Prediabetes: Secondary | ICD-10-CM | POA: Diagnosis not present

## 2017-09-02 DIAGNOSIS — E66811 Obesity, class 1: Secondary | ICD-10-CM

## 2017-09-02 DIAGNOSIS — R000 Tachycardia, unspecified: Secondary | ICD-10-CM | POA: Diagnosis not present

## 2017-09-02 DIAGNOSIS — E7800 Pure hypercholesterolemia, unspecified: Secondary | ICD-10-CM | POA: Diagnosis not present

## 2017-09-02 DIAGNOSIS — I10 Essential (primary) hypertension: Secondary | ICD-10-CM

## 2017-09-02 DIAGNOSIS — Z0000 Encounter for general adult medical examination without abnormal findings: Secondary | ICD-10-CM | POA: Diagnosis not present

## 2017-09-02 DIAGNOSIS — F341 Dysthymic disorder: Secondary | ICD-10-CM | POA: Diagnosis not present

## 2017-09-02 DIAGNOSIS — Z23 Encounter for immunization: Secondary | ICD-10-CM

## 2017-09-02 DIAGNOSIS — E6609 Other obesity due to excess calories: Secondary | ICD-10-CM | POA: Diagnosis not present

## 2017-09-02 MED ORDER — ROSUVASTATIN CALCIUM 10 MG PO TABS: 10.0000 mg | ORAL_TABLET | Freq: Every day | ORAL | 3 refills | Status: DC

## 2017-09-02 MED ORDER — CLONAZEPAM 0.5 MG PO TABS: 0.5000 mg | ORAL_TABLET | Freq: Every day | ORAL | 0 refills | Status: DC | PRN

## 2017-09-02 MED ORDER — LOSARTAN POTASSIUM 50 MG PO TABS: ORAL_TABLET | ORAL | 3 refills | Status: DC

## 2017-09-02 MED ORDER — SERTRALINE HCL 50 MG PO TABS: 50.0000 mg | ORAL_TABLET | Freq: Every day | ORAL | 3 refills | Status: DC

## 2017-09-02 MED ORDER — SPIRONOLACTONE 25 MG PO TABS: ORAL_TABLET | ORAL | 3 refills | Status: DC

## 2017-09-02 NOTE — Progress Notes (Signed)
Subjective:    Patient ID: Veronica [name], female    DOB: 28-Mar-1961, 56 y.o.   MRN: [id number]  HPI  Here for health maintenance exam and to review chronic medical problems   Working a lot -got a promotion this year / also a raise   Wt Readings from Last 3 Encounters:  09/02/17 201 lb (91.2 kg)  10/27/16 204 lb (92.5 kg)  08/31/16 202 lb (91.6 kg)  down 3 lb (she had actually gained to 209) - that motivated her  Gave up sodas  30.12 kg/m  Wants flu shot today  Mammogram 9/18 nl  Self breast exam - no lumps  Mother had breast cancer    Pap 10/17 negative  No symptoms  Just about done with menopause - hot flashes at work at times    Colonoscopy 8/15 5 y recall Mother had colon cancer  Personal hx of [place] with hemicolectomy  Tdap 1/13  bp is stable today  No cp or palpitations or headaches or edema  No side effects to medicines  BP Readings from Last 3 Encounters:  09/02/17 124/84  10/27/16 140/80  08/31/16 130/85     Hyperglycemia Lab Results  Component Value Date   HGBA1C 6.2 08/26/2017  was 6.0   Lab Results  Component Value Date   CREATININE 0.89 08/26/2017   BUN 11 08/26/2017   NA 140 08/26/2017   K 3.9 08/26/2017   CL 102 08/26/2017   CO2 29 08/26/2017   Lab Results  Component Value Date   ALT 25 08/26/2017   AST 19 08/26/2017   ALKPHOS 131 (H) 08/26/2017   BILITOT 0.4 08/26/2017    Lab Results  Component Value Date   TSH 2.67 08/26/2017     Hyperlipidemia Lab Results  Component Value Date   CHOL 181 08/26/2017   CHOL 179 10/21/2016   CHOL 303 (H) 08/24/2016   Lab Results  Component Value Date   HDL 42.60 08/26/2017   HDL 44.70 10/21/2016   HDL 45.90 08/24/2016   Lab Results  Component Value Date   LDLCALC 101 (H) 10/21/2016   LDLCALC 243 (H) 03/01/2014   Lab Results  Component Value Date   TRIG 220.0 (H) 08/26/2017   TRIG 168.0 (H) 10/21/2016   TRIG 397.0 (H) 08/24/2016   Lab Results  Component Value Date   CHOLHDL 4 08/26/2017   CHOLHDL 4 10/21/2016   CHOLHDL 7 08/24/2016   Lab Results  Component Value Date   LDLDIRECT 111.0 08/26/2017   LDLDIRECT 195.0 08/24/2016   LDLDIRECT 213.0 03/04/2015   On crestor and diet  LDL is much improved  More ear wax /itchy ears  ? Another cause   H/o of tachycardia Pulse Readings from Last 3 Encounters:  09/02/17 (!) 112  10/27/16 96  08/31/16 100   More anxiety  Wants to inc zoloft duing this stressful time to 52  Her psychiatrist left  Needs refil of klonopin-uses very rarely   Patient Active Problem List   Diagnosis Date Noted  . Family history of cerebral aneurysm 09/01/2016  . Obesity 09/01/2016  . Elevated glucose 08/31/2016  . Family history of early CAD 05/07/2015  . Palpitations 03/11/2015  . Encounter for routine gynecological examination 03/11/2015  . Exercise intolerance 03/11/2015  . Tachycardia 07/16/2014  . Hyperlipidemia 11/16/2011  . Other screening mammogram 11/16/2011  . Fibrocystic breast 11/16/2011  . Routine general medical examination at a health care facility 11/07/2011  . RGN ENTERITIS SMALL INTESTINE W/LG INTESTINE  11/25/2008  . CROHN'S DISEASE 11/25/2008  . NONSPECIFIC ABNORMAL RESULTS LIVR FUNCTION STUDY 09/25/2008  . Essential hypertension 08/22/2008  . OTHER ABNORMAL BLOOD CHEMISTRY 08/01/2008  . ANXIETY DEPRESSION 06/27/2008  . ACNE, MILD 06/27/2008  . EDEMA 06/27/2008   Past Medical History:  Diagnosis Date  . Acne   . Colonic mass 1/10  . Crohn's disease ([place])   . Depression   . Edema   . Hyperlipidemia   . Hypertension   . PMS (premenstrual syndrome)    Past Surgical History:  Procedure Laterality Date  . HEMICOLECTOMY  1/10   Social History  Substance Use Topics  . Smoking status: Never Smoker  . Smokeless tobacco: Never Used  . Alcohol use No   Family History  Problem Relation Age of Onset  . Colon cancer Mother   . Breast cancer Mother   . Alcohol abuse Brother   .  Hypertension Paternal Grandfather   . Diabetes Paternal Grandfather   . Coronary artery disease Paternal Grandfather   . Coronary artery disease Maternal Grandfather   . Aneurysm Paternal Grandmother        cerebral  . Hypertension Paternal Grandmother   . Pulmonary embolism Father   . Aneurysm Father        cerebral  . Alcohol abuse Son   . Drug abuse Son   . Depression Other        fam hx   Allergies  Allergen Reactions  . Hydrochlorothiazide     Dizzy and felt faint  . Ace Inhibitors     cough  . Fluoxetine Hcl     REACTION: urticartia  . Losartan     Cough   . Tenormin [Atenolol]     fatigue  . Venlafaxine     REACTION: weight gain, makes sleepy  . Bupropion Hcl     REACTION: tinnitus previously but no other symptoms.  She tolerated it on second trial w/o return of tinnitus.     Current Outpatient Prescriptions on File Prior to Visit  Medication Sig Dispense Refill  . [DISCONTINUED] zolpidem (AMBIEN) 10 MG tablet Take 10 mg by mouth at bedtime as needed.       No current facility-administered medications on file prior to visit.     Review of Systems  Constitutional: Negative for activity change, appetite change, fatigue, fever and unexpected weight change.  HENT: Negative for congestion, ear pain, rhinorrhea, sinus pressure and sore throat.   Eyes: Negative for pain, redness and visual disturbance.  Respiratory: Negative for cough, shortness of breath and wheezing.   Cardiovascular: Negative for chest pain and palpitations.  Gastrointestinal: Negative for abdominal pain, blood in stool, constipation and diarrhea.  Endocrine: Negative for polydipsia and polyuria.  Genitourinary: Negative for dysuria, frequency and urgency.  Musculoskeletal: Negative for arthralgias, back pain and myalgias.  Skin: Negative for pallor and rash.  Allergic/Immunologic: Negative for environmental allergies.  Neurological: Negative for dizziness, syncope and headaches.  Hematological:  Negative for adenopathy. Does not bruise/bleed easily.  Psychiatric/Behavioral: Negative for decreased concentration and dysphoric mood. The patient is nervous/anxious.        Objective:   Physical Exam  Constitutional: She appears well-developed and well-nourished. No distress.  obese and well appearing   HENT:  Head: Normocephalic and atraumatic.  Right Ear: External ear normal.  Left Ear: External ear normal.  Mouth/Throat: Oropharynx is clear and moist.  Eyes: Pupils are equal, round, and reactive to light. Conjunctivae and EOM are normal. No scleral icterus.  Neck: Normal range of motion. Neck supple. No JVD present. Carotid bruit is not present. No thyromegaly present.  Cardiovascular: Regular rhythm, normal heart sounds and intact distal pulses.  Exam reveals no gallop.   Mild tachycardia  Pulmonary/Chest: Effort normal and breath sounds normal. No respiratory distress. She has no wheezes. She exhibits no tenderness.  Abdominal: Soft. Bowel sounds are normal. She exhibits no distension, no abdominal bruit and no mass. There is no tenderness.  Genitourinary: No breast swelling, tenderness, discharge or bleeding.  Genitourinary Comments: Breast exam: No mass, nodules, thickening, tenderness, bulging, retraction, inflamation, nipple discharge or skin changes noted.  No axillary or clavicular LA.      Musculoskeletal: Normal range of motion. She exhibits no edema or tenderness.  Lymphadenopathy:    She has no cervical adenopathy.  Neurological: She is alert. She has normal reflexes. No cranial nerve deficit. She exhibits normal muscle tone. Coordination normal.  Skin: Skin is warm and dry. No rash noted. No erythema. No pallor.  Solar lentigines diffusely   Psychiatric: Her mood appears anxious. Her affect is not blunt and not labile. She does not exhibit a depressed mood.  Mildly anxious  Pleasant and talkative           Assessment & Plan:   Problem List Items Addressed  This Visit      Cardiovascular and Mediastinum   Essential hypertension - Primary    bp in fair control at this time  BP Readings from Last 1 Encounters:  09/02/17 124/84   No changes needed Disc lifstyle change with low sodium diet and exercise  Labs rev       Relevant Medications   spironolactone (ALDACTONE) 25 MG tablet   rosuvastatin (CRESTOR) 10 MG tablet   losartan (COZAAR) 50 MG tablet     Other   ANXIETY DEPRESSION    More anxious lately with new job  Reviewed stressors/ coping techniques/symptoms/ support sources/ tx options and side effects in detail today Will inc zoloft dose to 50 mg daily  Discussed expectations of SSRI medication including time to effectiveness and mechanism of action, also poss of side effects (early and late)- including mental fuzziness, weight or appetite change, nausea and poss of worse dep or anxiety (even suicidal thoughts)  Pt voiced understanding and will stop med and update if this occurs   Enc more exercise /outdoor time and self care        Relevant Medications   sertraline (ZOLOFT) 50 MG tablet   Hyperlipidemia   Relevant Medications   spironolactone (ALDACTONE) 25 MG tablet   rosuvastatin (CRESTOR) 10 MG tablet   losartan (COZAAR) 50 MG tablet   Obesity   Prediabetes    Mild prediabetes Lab Results  Component Value Date   HGBA1C 6.2 08/26/2017   disc imp of low glycemic diet and wt loss to prevent DM2  Enc further work on wt loss       Routine general medical examination at a health care facility    Reviewed health habits including diet and exercise and skin cancer prevention Reviewed appropriate screening tests for age  Also reviewed health mt list, fam hx and immunization status , as well as social and family history    See HPI  Labs reviewed  Pulse elevated again suspect due to anxiety-will tx more aggressively and update  Enc further wt loss with diet and exercise       Tachycardia    Pulse 112  More than  baseline  Suspect due to inc anxiety Will inc zoloft and continue to follow         Other Visit Diagnoses    Need for influenza vaccination       Relevant Orders   Flu Vaccine QUAD 6+ mos PF IM (Fluarix Quad PF) (Completed)

## 2017-09-02 NOTE — Patient Instructions (Addendum)
Try to get most of your carbohydrates from produce (with the exception of white potatoes)  Eat less bread/pasta/rice/snack foods/cereals/sweets and other items from the middle of the grocery store (processed carbs)  This will prevent diabetes and help with weight loss   Go up on zoloft to 50 mg daily  Klonopin sparingly only if needed   Flu shot today   Keep working on diet and exercise and weight loss

## 2017-09-04 NOTE — Assessment & Plan Note (Signed)
Pulse 112  More than baseline  Suspect due to inc anxiety Will inc zoloft and continue to follow

## 2017-09-04 NOTE — Assessment & Plan Note (Addendum)
Mild prediabetes Lab Results  Component Value Date   HGBA1C 6.2 08/26/2017   disc imp of low glycemic diet and wt loss to prevent DM2  Enc further work on wt loss

## 2017-09-04 NOTE — Assessment & Plan Note (Signed)
Reviewed health habits including diet and exercise and skin cancer prevention Reviewed appropriate screening tests for age  Also reviewed health mt list, fam hx and immunization status , as well as social and family history    See HPI  Labs reviewed  Pulse elevated again suspect due to anxiety-will tx more aggressively and update  Enc further wt loss with diet and exercise

## 2017-09-04 NOTE — Assessment & Plan Note (Signed)
More anxious lately with new job  Reviewed stressors/ coping techniques/symptoms/ support sources/ tx options and side effects in detail today Will inc zoloft dose to 50 mg daily  Discussed expectations of SSRI medication including time to effectiveness and mechanism of action, also poss of side effects (early and late)- including mental fuzziness, weight or appetite change, nausea and poss of worse dep or anxiety (even suicidal thoughts)  Pt voiced understanding and will stop med and update if this occurs   Enc more exercise /outdoor time and self care

## 2017-09-04 NOTE — Assessment & Plan Note (Signed)
bp in fair control at this time  BP Readings from Last 1 Encounters:  09/02/17 124/84   No changes needed Disc lifstyle change with low sodium diet and exercise  Labs rev

## 2017-09-08 ENCOUNTER — Other Ambulatory Visit: Payer: Self-pay | Admitting: Family Medicine

## 2017-10-23 ENCOUNTER — Encounter: Payer: Self-pay | Admitting: Family Medicine

## 2018-08-27 ENCOUNTER — Telehealth: Payer: Self-pay | Admitting: Family Medicine

## 2018-08-27 DIAGNOSIS — E7800 Pure hypercholesterolemia, unspecified: Secondary | ICD-10-CM

## 2018-08-27 DIAGNOSIS — Z0000 Encounter for general adult medical examination without abnormal findings: Secondary | ICD-10-CM

## 2018-08-27 DIAGNOSIS — I10 Essential (primary) hypertension: Secondary | ICD-10-CM

## 2018-08-27 DIAGNOSIS — R7303 Prediabetes: Secondary | ICD-10-CM

## 2018-08-27 NOTE — Telephone Encounter (Signed)
-----   Message from Ellamae Sia sent at 08/22/2018  3:49 PM EDT ----- Regarding: Lab orders for Wednesday, 10.23.19 Patient is scheduled for CPX labs, please order future labs, Thanks , Karna Christmas

## 2018-08-28 ENCOUNTER — Encounter: Payer: Self-pay | Admitting: Family Medicine

## 2018-08-28 MED ORDER — LOSARTAN POTASSIUM 25 MG PO TABS: 50.0000 mg | ORAL_TABLET | Freq: Every day | ORAL | 3 refills | Status: DC

## 2018-08-31 ENCOUNTER — Other Ambulatory Visit (INDEPENDENT_AMBULATORY_CARE_PROVIDER_SITE_OTHER): Payer: Managed Care, Other (non HMO)

## 2018-08-31 DIAGNOSIS — I10 Essential (primary) hypertension: Secondary | ICD-10-CM | POA: Diagnosis not present

## 2018-08-31 DIAGNOSIS — R7303 Prediabetes: Secondary | ICD-10-CM

## 2018-08-31 DIAGNOSIS — E7800 Pure hypercholesterolemia, unspecified: Secondary | ICD-10-CM | POA: Diagnosis not present

## 2018-08-31 LAB — LDL CHOLESTEROL, DIRECT: Direct LDL: 262 mg/dL

## 2018-08-31 LAB — CBC WITH DIFFERENTIAL/PLATELET
Basophils Absolute: 0.1 K/uL (ref 0.0–0.1)
Basophils Relative: 1 % (ref 0.0–3.0)
Eosinophils Absolute: 0.2 K/uL (ref 0.0–0.7)
Eosinophils Relative: 3 % (ref 0.0–5.0)
HCT: 41 % (ref 36.0–46.0)
Hemoglobin: 13.8 g/dL (ref 12.0–15.0)
Lymphocytes Relative: 35.3 % (ref 12.0–46.0)
Lymphs Abs: 2.2 K/uL (ref 0.7–4.0)
MCHC: 33.7 g/dL (ref 30.0–36.0)
MCV: 85.9 fl (ref 78.0–100.0)
Monocytes Absolute: 0.6 K/uL (ref 0.1–1.0)
Monocytes Relative: 9 % (ref 3.0–12.0)
Neutro Abs: 3.2 K/uL (ref 1.4–7.7)
Neutrophils Relative %: 51.7 % (ref 43.0–77.0)
Platelets: 393 K/uL (ref 150.0–400.0)
RBC: 4.77 Mil/uL (ref 3.87–5.11)
RDW: 14.3 % (ref 11.5–15.5)
WBC: 6.3 K/uL (ref 4.0–10.5)

## 2018-08-31 LAB — COMPREHENSIVE METABOLIC PANEL WITH GFR
ALT: 25 U/L (ref 0–35)
AST: 19 U/L (ref 0–37)
Albumin: 4.2 g/dL (ref 3.5–5.2)
Alkaline Phosphatase: 139 U/L — ABNORMAL HIGH (ref 39–117)
BUN: 11 mg/dL (ref 6–23)
CO2: 26 meq/L (ref 19–32)
Calcium: 9.7 mg/dL (ref 8.4–10.5)
Chloride: 104 meq/L (ref 96–112)
Creatinine, Ser: 0.94 mg/dL (ref 0.40–1.20)
GFR: 65.55 mL/min
Glucose, Bld: 115 mg/dL — ABNORMAL HIGH (ref 70–99)
Potassium: 3.7 meq/L (ref 3.5–5.1)
Sodium: 141 meq/L (ref 135–145)
Total Bilirubin: 0.4 mg/dL (ref 0.2–1.2)
Total Protein: 7.3 g/dL (ref 6.0–8.3)

## 2018-08-31 LAB — TSH: TSH: 2.05 u[IU]/mL (ref 0.35–4.50)

## 2018-08-31 LAB — LIPID PANEL
CHOL/HDL RATIO: 9
CHOLESTEROL: 327 mg/dL — AB (ref 0–200)
HDL: 36.3 mg/dL — AB (ref >39.00)
NONHDL: 291.06
TRIGLYCERIDES: 248 mg/dL — AB (ref 0.0–149.0)
VLDL: 49.6 mg/dL — ABNORMAL HIGH (ref 0.0–40.0)

## 2018-08-31 LAB — HEMOGLOBIN A1C: Hgb A1c MFr Bld: 5.8 % (ref 4.6–6.5)

## 2018-09-05 ENCOUNTER — Encounter: Payer: Self-pay | Admitting: Family Medicine

## 2018-09-05 ENCOUNTER — Ambulatory Visit (INDEPENDENT_AMBULATORY_CARE_PROVIDER_SITE_OTHER): Payer: Managed Care, Other (non HMO) | Admitting: Family Medicine

## 2018-09-05 VITALS — BP 135/80 | HR 106 | Temp 98.0°F | Ht 68.25 in | Wt 196.5 lb

## 2018-09-05 DIAGNOSIS — Z6830 Body mass index (BMI) 30.0-30.9, adult: Secondary | ICD-10-CM

## 2018-09-05 DIAGNOSIS — F341 Dysthymic disorder: Secondary | ICD-10-CM | POA: Diagnosis not present

## 2018-09-05 DIAGNOSIS — I10 Essential (primary) hypertension: Secondary | ICD-10-CM

## 2018-09-05 DIAGNOSIS — E7800 Pure hypercholesterolemia, unspecified: Secondary | ICD-10-CM

## 2018-09-05 DIAGNOSIS — Z0000 Encounter for general adult medical examination without abnormal findings: Secondary | ICD-10-CM

## 2018-09-05 DIAGNOSIS — E66811 Obesity, class 1: Secondary | ICD-10-CM

## 2018-09-05 DIAGNOSIS — Z8249 Family history of ischemic heart disease and other diseases of the circulatory system: Secondary | ICD-10-CM

## 2018-09-05 DIAGNOSIS — R000 Tachycardia, unspecified: Secondary | ICD-10-CM

## 2018-09-05 DIAGNOSIS — K50919 Crohn's disease, unspecified, with unspecified complications: Secondary | ICD-10-CM

## 2018-09-05 DIAGNOSIS — R7303 Prediabetes: Secondary | ICD-10-CM

## 2018-09-05 DIAGNOSIS — E6609 Other obesity due to excess calories: Secondary | ICD-10-CM

## 2018-09-05 MED ORDER — LOSARTAN POTASSIUM 25 MG PO TABS: 50.0000 mg | ORAL_TABLET | Freq: Every day | ORAL | 3 refills | Status: DC

## 2018-09-05 MED ORDER — SERTRALINE HCL 50 MG PO TABS: 50.0000 mg | ORAL_TABLET | Freq: Every day | ORAL | 3 refills | Status: DC

## 2018-09-05 NOTE — Progress Notes (Signed)
Subjective:    Patient ID: Veronica [name], female    DOB: July 04, 1961, 57 y.o.   MRN: [id number]  HPI Here for health maintenance exam and to review chronic medical problems    Working a lot  Stressful  Fredrich Birks is difficult to work with   [facility] Readings from Last 3 Encounters:  09/05/18 196 lb 8 oz (89.1 kg)  09/02/17 201 lb (91.2 kg)  10/27/16 204 lb (92.5 kg)  wt is coming down  Drinking water instead of sugar drinks  Avoiding fast food for the most part  Less red meat  Moderation - improving with time  Not much exercise - impossible to fit into schedule (a little on weekends)  Can occ take walks at work at lunch  29.66 kg/m   Mammogram 9/18 - this is due/she will schedule and has reminder  Self breast exam -no lumps  Mother had breast cancer   Pap 10/17 - neg  Neg HPV in 2016 Will do pap in a year  Fully menopausal - last spotting in December    Tdap 1/13 Colonoscopy 8/15 with 5 y recall / mother had colon cancer and pt has personal hx of chron's and hemicolectomy She gets reminders  No trouble with her chron's  Has to watch salad intake   Flu shot 10/19   Zoster status  Has not had shingles  May be interested in shingles   bp is elevated today  No cp or palpitations or headaches or edema  No side effects to medicines  BP Readings from Last 3 Encounters:  09/05/18 (!) 142/90  09/02/17 124/84  10/27/16 140/80     H/o tachycardia Pulse Readings from Last 3 Encounters:  09/05/18 (!) 106  09/02/17 (!) 112  10/27/16 96   Intol to beta blockers   Mood/stress-stable with zoloft   Hyperlipidemia Lab Results  Component Value Date   CHOL 327 (H) 08/31/2018   CHOL 181 08/26/2017   CHOL 179 10/21/2016   Lab Results  Component Value Date   HDL 36.30 (L) 08/31/2018   HDL 42.60 08/26/2017   HDL 44.70 10/21/2016   Lab Results  Component Value Date   LDLCALC 101 (H) 10/21/2016   LDLCALC 243 (H) 03/01/2014   Lab Results  Component Value Date   TRIG  248.0 (H) 08/31/2018   TRIG 220.0 (H) 08/26/2017   TRIG 168.0 (H) 10/21/2016   Lab Results  Component Value Date   CHOLHDL 9 08/31/2018   CHOLHDL 4 08/26/2017   CHOLHDL 4 10/21/2016   Lab Results  Component Value Date   LDLDIRECT 262.0 08/31/2018   LDLDIRECT 111.0 08/26/2017   LDLDIRECT 195.0 08/24/2016   fam hx of early CAD Prev on crestor - made her very tired and 'stupid' - affected memory   Prediabetes Lab Results  Component Value Date   HGBA1C 5.8 08/31/2018    Patient Active Problem List   Diagnosis Date Noted  . Family history of cerebral aneurysm 09/01/2016  . Obesity 09/01/2016  . Prediabetes 08/31/2016  . Family history of early CAD 05/07/2015  . Palpitations 03/11/2015  . Encounter for routine gynecological examination 03/11/2015  . Exercise intolerance 03/11/2015  . Tachycardia 07/16/2014  . Hyperlipidemia 11/16/2011  . Other screening mammogram 11/16/2011  . Fibrocystic breast 11/16/2011  . Routine general medical examination at a health care facility 11/07/2011  . 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 Regional enteritis ([place]) 11/25/2008  . NONSPECIFIC ABNORMAL RESULTS LIVR FUNCTION STUDY 09/25/2008  .  Essential hypertension 08/22/2008  . OTHER ABNORMAL BLOOD CHEMISTRY 08/01/2008  . ANXIETY DEPRESSION 06/27/2008  . ACNE, MILD 06/27/2008  . EDEMA 06/27/2008   Past Medical History:  Diagnosis Date  . Acne   . Colonic mass 1/10  . Crohn's disease ([place])   . Depression   . Edema   . Hyperlipidemia   . Hypertension   . PMS (premenstrual syndrome)    Past Surgical History:  Procedure Laterality Date  . HEMICOLECTOMY  1/10   Social History   Tobacco Use  . Smoking status: Never Smoker  . Smokeless tobacco: Never Used  Substance Use Topics  . Alcohol use: No    Alcohol/week: 0.0 standard drinks  . Drug use: No   Family History  Problem Relation Age of Onset  . Colon cancer Mother   . Breast cancer Mother   . Alcohol  abuse Brother   . Hypertension Paternal Grandfather   . Diabetes Paternal Grandfather   . Coronary artery disease Paternal Grandfather   . Coronary artery disease Maternal Grandfather   . Aneurysm Paternal Grandmother        cerebral  . Hypertension Paternal Grandmother   . Pulmonary embolism Father   . Aneurysm Father        cerebral  . Alcohol abuse Son   . Drug abuse Son   . Depression Other        fam hx   Allergies  Allergen Reactions  . Hydrochlorothiazide     Dizzy and felt faint  . Ace Inhibitors     cough  . Crestor [Rosuvastatin [place]     Memory   . Fluoxetine Hcl     REACTION: urticartia  . Tenormin [Atenolol]     fatigue  . Venlafaxine     REACTION: weight gain, makes sleepy  . Bupropion Hcl     REACTION: tinnitus previously but no other symptoms.  She tolerated it on second trial w/o return of tinnitus.     Current Outpatient Medications on File Prior to Visit  Medication Sig Dispense Refill  . clonazePAM (KLONOPIN) 0.5 MG tablet Take 1 tablet (0.5 mg total) by mouth daily as needed for anxiety. 30 tablet 0  . spironolactone (ALDACTONE) 25 MG tablet Take 12.5 mg by mouth daily.    . [DISCONTINUED] zolpidem (AMBIEN) 10 MG tablet Take 10 mg by mouth at bedtime as needed.       No current facility-administered medications on file prior to visit.     Review of Systems  Constitutional: Negative for activity change, appetite change, fatigue, fever and unexpected weight change.  HENT: Negative for congestion, ear pain, rhinorrhea, sinus pressure and sore throat.   Eyes: Negative for pain, redness and visual disturbance.  Respiratory: Negative for cough, shortness of breath and wheezing.   Cardiovascular: Negative for chest pain and palpitations.  Gastrointestinal: Negative for abdominal pain, blood in stool, constipation and diarrhea.  Endocrine: Negative for polydipsia and polyuria.  Genitourinary: Negative for dysuria, frequency and urgency.    Musculoskeletal: Negative for arthralgias, back pain and myalgias.  Skin: Negative for pallor and rash.  Allergic/Immunologic: Negative for environmental allergies.  Neurological: Negative for dizziness, syncope and headaches.  Hematological: Negative for adenopathy. Does not bruise/bleed easily.  Psychiatric/Behavioral: Negative for decreased concentration and dysphoric mood. The patient is nervous/anxious.        Stressors        Objective:   Physical Exam  Constitutional: She appears well-developed and well-nourished. No distress.  overwt and  well app  HENT:  Head: Normocephalic and atraumatic.  Right Ear: External ear normal.  Left Ear: External ear normal.  Mouth/Throat: Oropharynx is clear and moist.  Eyes: Pupils are equal, round, and reactive to light. Conjunctivae and EOM are normal. No scleral icterus.  Neck: Normal range of motion. Neck supple. No JVD present. Carotid bruit is not present. No thyromegaly present.  Cardiovascular: Normal rate, regular rhythm, normal heart sounds and intact distal pulses. Exam reveals no gallop.  Pulmonary/Chest: Effort normal and breath sounds normal. No respiratory distress. She has no wheezes. She has no rales. She exhibits no tenderness. No breast tenderness, discharge or bleeding.  Abdominal: Soft. Bowel sounds are normal. She exhibits no distension, no abdominal bruit and no mass. There is no tenderness.  Genitourinary: No breast tenderness, discharge or bleeding.  Genitourinary Comments: Breast exam: No mass, nodules, thickening, tenderness, bulging, retraction, inflamation, nipple discharge or skin changes noted.  No axillary or clavicular LA.      Musculoskeletal: Normal range of motion. She exhibits no edema or tenderness.  Lymphadenopathy:    She has no cervical adenopathy.  Neurological: She is alert. She has normal reflexes. She displays normal reflexes. No cranial nerve deficit. She exhibits normal muscle tone. Coordination  normal.  Skin: Skin is warm and dry. No rash noted. No erythema. No pallor.  Fair complexion  Psychiatric: She has a normal mood and affect.  Pleasant           Assessment & Plan:   Problem List Items Addressed This Visit      Cardiovascular and Mediastinum   Essential hypertension    bp in fair control at this time  BP Readings from Last 1 Encounters:  09/05/18 135/80   No changes needed Most recent labs reviewed  Disc lifstyle change with low sodium diet and exercise        Relevant Medications   spironolactone (ALDACTONE) 25 MG tablet   losartan (COZAAR) 25 MG tablet   Other Relevant Orders   Ambulatory referral to Cardiology     Digestive   Regional enteritis [facility])    No clinical changes  utd colonoscopy         Other   ANXIETY DEPRESSION    Reviewed stressors/ coping techniques/symptoms/ support sources/ tx options and side effects in detail today Stable with zoloft       Relevant Medications   sertraline (ZOLOFT) 50 MG tablet   Family history of early CAD    With hx of very high cholesterol/intol of statins  Ref to cardiology       Hyperlipidemia    Disc goals for lipids and reasons to control them Rev last labs with pt Rev low sat fat diet in detail Very high LDL at 71 fam hx of CAD Intol of statin  Ref to cardiology  ? If candidate for PCYK9 inhibitor      Relevant Medications   spironolactone (ALDACTONE) 25 MG tablet   losartan (COZAAR) 25 MG tablet   Other Relevant Orders   Ambulatory referral to Cardiology   Obesity    Discussed how this problem influences overall health and the risks it imposes  Reviewed plan for weight loss with lower calorie diet (via better food choices and also portion control or program like weight watchers) and exercise building up to or more than 30 minutes 5 days per week including some aerobic activity         Prediabetes    Lab Results  Component  Value Date   HGBA1C 5.8 08/31/2018   Improving    disc imp of low glycemic diet and wt loss to prevent DM2       Routine general medical examination at a health care facility - Primary    Reviewed health habits including diet and exercise and skin cancer prevention Reviewed appropriate screening tests for age  Also reviewed health mt list, fam hx and immunization status , as well as social and family history   See HPI Labs rev  She will schedule her own mammogram shingrix disc Ref to cardiology- risk factors/high chol      Tachycardia    Chronic Minimize caffeine Ref to cardiol Pulse Readings from Last 3 Encounters:  09/05/18 (!) 106  09/02/17 (!) 112  10/27/16 96         Relevant Orders   Ambulatory referral to Cardiology

## 2018-09-05 NOTE — Patient Instructions (Addendum)
Please schedule your mammogram   If you are interested in the new shingles vaccine (Shingrix) - call your local pharmacy to check on coverage and availability  If affordable, get on a wait list at your pharmacy- or here  to get the vaccine.  BP is better on 2nd check  We will refer to cardiology for cholesterol and other risk factors  Keep working on healthy diet  Fit in exercise every minute you can

## 2018-09-06 NOTE — Assessment & Plan Note (Signed)
No clinical changes  utd colonoscopy

## 2018-09-06 NOTE — Assessment & Plan Note (Signed)
Discussed how this problem influences overall health and the risks it imposes  Reviewed plan for weight loss with lower calorie diet (via better food choices and also portion control or program like weight watchers) and exercise building up to or more than 30 minutes 5 days per week including some aerobic activity    

## 2018-09-06 NOTE — Assessment & Plan Note (Signed)
Reviewed health habits including diet and exercise and skin cancer prevention Reviewed appropriate screening tests for age  Also reviewed health mt list, fam hx and immunization status , as well as social and family history   See HPI Labs rev  She will schedule her own mammogram shingrix disc Ref to cardiology- risk factors/high chol

## 2018-09-06 NOTE — Assessment & Plan Note (Signed)
Disc goals for lipids and reasons to control them Rev last labs with pt Rev low sat fat diet in detail Very high LDL at 89 fam hx of CAD Intol of statin  Ref to cardiology  ? If candidate for PCYK9 inhibitor

## 2018-09-06 NOTE — Assessment & Plan Note (Signed)
Lab Results  Component Value Date   HGBA1C 5.8 08/31/2018   Improving  disc imp of low glycemic diet and wt loss to prevent DM2

## 2018-09-06 NOTE — Assessment & Plan Note (Signed)
With hx of very high cholesterol/intol of statins  Ref to cardiology

## 2018-09-06 NOTE — Assessment & Plan Note (Signed)
Reviewed stressors/ coping techniques/symptoms/ support sources/ tx options and side effects in detail today Stable with zoloft

## 2018-09-06 NOTE — Assessment & Plan Note (Signed)
Chronic Minimize caffeine Ref to cardiol Pulse Readings from Last 3 Encounters:  09/05/18 (!) 106  09/02/17 (!) 112  10/27/16 96

## 2018-09-06 NOTE — Assessment & Plan Note (Signed)
bp in fair control at this time  BP Readings from Last 1 Encounters:  09/05/18 135/80   No changes needed Most recent labs reviewed  Disc lifstyle change with low sodium diet and exercise

## 2018-09-08 MED ORDER — CLONAZEPAM 0.5 MG PO TABS: 0.5000 mg | ORAL_TABLET | Freq: Every day | ORAL | 0 refills | Status: DC | PRN

## 2018-09-08 NOTE — Telephone Encounter (Signed)
done

## 2018-10-02 ENCOUNTER — Ambulatory Visit: Payer: Managed Care, Other (non HMO) | Admitting: Cardiology

## 2018-10-02 ENCOUNTER — Encounter: Payer: Self-pay | Admitting: Cardiology

## 2018-10-02 VITALS — BP 140/90 | HR 95 | Ht 68.0 in | Wt 195.0 lb

## 2018-10-02 DIAGNOSIS — Z7189 Other specified counseling: Secondary | ICD-10-CM | POA: Diagnosis not present

## 2018-10-02 DIAGNOSIS — Z01812 Encounter for preprocedural laboratory examination: Secondary | ICD-10-CM

## 2018-10-02 DIAGNOSIS — E7800 Pure hypercholesterolemia, unspecified: Secondary | ICD-10-CM

## 2018-10-02 DIAGNOSIS — R079 Chest pain, unspecified: Secondary | ICD-10-CM

## 2018-10-02 MED ORDER — METOPROLOL TARTRATE 50 MG PO TABS: ORAL_TABLET | ORAL | 0 refills | Status: DC

## 2018-10-02 NOTE — Progress Notes (Signed)
Cardiology Office Note:    Date:  10/02/2018   ID:  Veronica [name], DOB Jan 19, 1961, MRN [id number]  PCP:  Veronica Greenspan, MD  Cardiologist:  Veronica Dresser, MD PhD  Referring MD: Veronica Greenspan, MD   CC: establish care/high cholesterol  History of Present Illness:    Veronica [name] is a 57 y.o. female with a hx of hyperlipidemia, hypertension who is seen as a new consult at the request of Veronica [name], Veronica Fanny, MD for the evaluation and management of family history of early CAD with hyperlipidemia. Last LDL was 262.  Father: Cholesterol was over 400. Died age 25, ischemic heart disease. Had history of plaque in his legs causing clots. Unclear cause of death, either stroke or aneurysm. Mother: High cholesterol, total 258. On metoprolol only. On no meds for cholesterol Veronica [name]: died age 66 of MI Veronica [name]: died about age 82, had prior brain aneurysm 11 years prior to her death, died of stroke Veronica [name]: polymyalgia and giant cell arteritis, died age 20 of intestinal issues Veronica [name]: died of MI at age 53. Brother: 5 years Veronica [name], unknown medical history other than alcoholism Veronica siblings: 38 sisters, 3 brothers, many with high cholesterol, stents including carotid and PAD, many with ischemic heart disease  Has two children, son age 33 and daughter age 62. No known health issues  No personal history of xanthoma. Tried statin in the past (can't remember which one, per chart likely rosuvastatin), had severe memory issues with it. Had fatigue on beta blocker. Has anxiety. Has done fine with anesthesia in the past for procedures.  Does feel presyncopal, short of breath with exertion, like on treadmill, for the last two years. Had a stress test in 2016 (echo stress). No ischemia on imaging. No recent syncope (vagal during teenage years).    Past Medical History:  Diagnosis Date  . Acne   . Colonic mass 1/10  . Crohn's disease ([place])   . Depression   . Edema   . Hyperlipidemia   .  Hypertension   . PMS (premenstrual syndrome)     Past Surgical History:  Procedure Laterality Date  . HEMICOLECTOMY  1/10    Current Medications: Current Outpatient Medications on File Prior to Visit  Medication Sig  . clonazePAM (KLONOPIN) 0.5 MG tablet Take 1 tablet (0.5 mg total) by mouth daily as needed for anxiety.  Marland Kitchen losartan (COZAAR) 25 MG tablet Take 2 tablets (50 mg total) by mouth daily.  . sertraline (ZOLOFT) 50 MG tablet Take 1 tablet (50 mg total) by mouth daily.  Marland Kitchen spironolactone (ALDACTONE) 25 MG tablet Take 12.5 mg by mouth daily.   No current facility-administered medications on file prior to visit.      Allergies:   Hydrochlorothiazide; Ace inhibitors; Crestor [rosuvastatin calcium]; Fluoxetine hcl; Tenormin [atenolol]; Venlafaxine; and Bupropion hcl   Social History   Socioeconomic History  . Marital status: Married    Spouse name: Not on file  . Number of children: 2  . Years of education: Not on file  . Highest education level: Not on file  Occupational History  . Occupation: Wellsite geologist  Social Needs  . Financial resource strain: Not on file  . Food insecurity:    Worry: Not on file    Inability: Not on file  . Transportation needs:    Medical: Not on file    Non-medical: Not on file  Tobacco Use  . Smoking status: Never Smoker  . Smokeless  tobacco: Never Used  Substance and Sexual Activity  . Alcohol use: No    Alcohol/week: 0.0 standard drinks  . Drug use: No  . Sexual activity: Not on file  Lifestyle  . Physical activity:    Days per week: Not on file    Minutes per session: Not on file  . Stress: Not on file  Relationships  . Social connections:    Talks on phone: Not on file    Gets together: Not on file    Attends religious service: Not on file    Active member of club or organization: Not on file    Attends meetings of clubs or organizations: Not on file    Relationship status: Not on file  Other Topics  Concern  . 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   Father: Cholesterol was over 400. Died age 9, ischemic heart disease. Had history of plaque in his legs causing clots. Unclear cause of death, either stroke or aneurysm. Mother: High cholesterol, total 258. On metoprolol only. On no meds for cholesterol Veronica [name]: died age 74 of MI Veronica [name]: died about age 10, had prior brain aneurysm 11 years prior to her death, died of stroke Veronica [name]: polymyalgia and giant cell arteritis, died age 47 of intestinal issues Veronica [name]: died of MI at age 4. Brother: 5 years Veronica [name], unknown medical history other than alcoholism Veronica siblings: 16 sisters, 3 brothers, many with high cholesterol, stents including carotid and PAD, many with ischemic heart disease  Has two children, son age 62 and daughter age 46. No known health issues  ROS:   Please see the history of present illness.  Additional pertinent ROS:  Constitutional: Negative for chills, fever, night sweats, unintentional weight loss  HENT: Negative for ear pain and hearing loss.   Eyes: Negative for loss of vision and eye pain.  Respiratory: Positive for shortness of breath with exertion. Negative for cough, sputum, shortness of breath, wheezing.   Cardiovascular: Positive for presyncope with  exertion. Negative for chest pain, palpitations, PND, orthopnea, lower extremity edema and claudication.  Gastrointestinal: Negative for abdominal pain, melena, and hematochezia.  Genitourinary: Negative for dysuria and hematuria.  Musculoskeletal: Negative for falls and myalgias.  Skin: Negative for itching and rash.  Neurological: Negative for focal weakness, focal sensory changes and loss of consciousness.  Endo/Heme/Allergies: Does not bruise/bleed easily.    EKGs/Labs/Other Studies Reviewed:    The following studies were reviewed today: Stress echo 05/15/15 Study Conclusions  - Stress ECG conclusions: 1 mm ST segment depressin in lateral   leads during recovery Non diagnostic due to baseline changes.   There were no stress arrhythmias or conduction abnormalities. The   stress ECG was negative for ischemia. - Staged echo: There was no echocardiographic evidence for   stress-induced ischemia. - Impressions:  Low risk study Normal resting and stress echo   images. ECG with baseline changes and abnormal ST depression in   lateral leads in recovery.  Impressions:  - Low risk study Normal resting and stress echo images. ECG with   baseline changes and abnormal ST depression in lateral leads in   recovery.   EKG:  EKG is personally reviewed.  The ekg ordered today demonstrates normal sinus rhythm with nonspecific T wave changes  Recent Labs: 08/31/2018: ALT 25; BUN 11; Creatinine, Ser 0.94; Hemoglobin 13.8; Platelets 393.0; Potassium 3.7; Sodium 141; TSH 2.05  Recent Lipid Panel    Component Value Date/Time   CHOL 327 (H) 08/31/2018 0739   TRIG 248.0 (H) 08/31/2018 0739   HDL 36.30 (L) 08/31/2018 0739   CHOLHDL 9 08/31/2018 0739   VLDL 49.6 (H) 08/31/2018 0739   LDLCALC 101 (H) 10/21/2016 0840   LDLDIRECT 262.0 08/31/2018 0739    Physical Exam:    VS:  BP 140/90   Pulse 95   Ht 5' 8"  (1.727 m)   Wt 195 lb (88.5 kg)   SpO2 98%   BMI 29.65 kg/m    Orthostatic VS  for the past 24 hrs (Last 3 readings):  BP- Lying Pulse- Lying BP- Sitting Pulse- Sitting BP- Standing at 0 minutes Pulse- Standing at 0 minutes BP- Standing at 3 minutes Pulse- Standing at 3 minutes  10/02/18 1343 140/90 97 138/87 93 151/87 101 (!) 147/96 109    Wt Readings from Last 3 Encounters:  10/02/18 195 lb (88.5 kg)  09/05/18 196 lb 8 oz (89.1 kg)  09/02/17 201 lb (91.2 kg)     GEN: Well nourished, well developed in no acute distress HEENT: Normal NECK: No JVD; No carotid bruits LYMPHATICS: No lymphadenopathy CARDIAC: regular rhythm, normal S1 and S2, no murmurs, rubs, gallops. Radial and DP pulses 2+ bilaterally. RESPIRATORY:  Clear to auscultation without rales, wheezing or rhonchi  ABDOMEN: Soft, non-tender, non-distended MUSCULOSKELETAL:  No edema; No deformity  SKIN: Warm and dry NEUROLOGIC:  Alert and oriented x 3 PSYCHIATRIC:  Normal affect   ASSESSMENT:    1. Pure hypercholesterolemia   2. Chest pain, unspecified type   3. Pre-procedure lab exam   4. Counseling on health promotion and disease prevention    PLAN:    1. Hypercholesterolemia: Likely heterozygous familial hypercholesterolemia, with exertional presyncope/shortness of breath concerning for anginal equivalent -children should be screened with lipid panel, discussed today -coronary CTA for anginal equivalent symptoms with exertion. Discussed cath as well given her high risk, but she is very anxious with procedures.  -will start PCSK9 inhibitor; PharmD to meet today to discuss initiation -spent significant time discussing genetics of disease. Offered referral for genetic testing, especially given that she has children. Did discuss issues with obtaining life insurance after genetic screening. She will think about it -has not tolerated statin in the past due to memory issues.   2. Prevention and health promotion counseling -recommend heart healthy/Mediterranean diet, with whole grains, fruits, vegetable,  fish, lean meats, nuts, and olive oil. Limit salt. -recommend moderate walking, 3-5 times/week for 30-50 minutes each session. Aim for at least 150 minutes.week. Goal should be pace of 3 miles/hours, or walking 1.5 miles in 30 minutes -recommend avoidance of tobacco products. Avoid excess alcohol. -Additional risk factor control:  -Diabetes: A1c is 5.8  -Lipids: as above  -Blood pressure control: very anxious today, mildly elevated. Would aim for <130/80 given cardiovascular risk. On losartan and spironolactone. Felt poorly on  beta blocker before. If remains elevated, will discuss increasing dose vs. Starting amlodipine. May benefit from ambulatory blood pressure monitoring given anxiety component  -Weight: BMI 29.   Plan for follow up: 3 mos  TIME SPENT WITH PATIENT: >60 minutes of direct patient care. More than 50% of that time was spent on coordination of care and counseling regarding hypercholesterolemia, genetics, recommendations and options for testing and management.  Veronica Dresser, MD, PhD [place]  [facility]   Medication Adjustments/Labs and Tests Ordered: Current medicines are reviewed at length with the patient today.  Concerns regarding medicines are outlined above.  Orders Placed This Encounter  Procedures  . CT CORONARY MORPH W/CTA COR W/SCORE W/CA W/CM &/OR WO/CM  . CT CORONARY FRACTIONAL FLOW RESERVE DATA PREP  . CT CORONARY FRACTIONAL FLOW RESERVE FLUID ANALYSIS  . Basic metabolic panel  . EKG 12-Lead   Meds ordered this encounter  Medications  . metoprolol tartrate (LOPRESSOR) 50 MG tablet    Sig: TAKE 1 TABLET 2 HR PRIOR TO CARDIAC PROCEDURE    Dispense:  1 tablet    Refill:  0    Patient Instructions  Medication Instructions:  Your Physician recommend you continue on your current medication as directed.   Print coupon at [email or web address] or [email or web address]    If you need a refill on your cardiac medications before your next appointment, please call  your pharmacy.   Lab work: Your physician recommends that you return for lab work 1 week prior to procedure.  If you have labs (blood work) drawn today and your tests are completely normal, you will receive your results only by: Marland Kitchen MyChart Message (if you have MyChart) OR . A paper copy in the mail If you have any lab test that is abnormal or we need to change your treatment, we will call you to review the results.  Testing/Procedures: Your physician has requested that you have cardiac CT. Cardiac computed tomography (CT) is a painless test that uses an x-ray machine to take clear, detailed pictures of your heart. For further information please visit HugeFiesta.tn. Please follow instruction sheet as given. [facility]    Follow-Up: At [facility], you and your health needs are our priority.  As part of our continuing mission to provide you with exceptional heart care, we have created designated Provider Care Teams.  These Care Teams include your primary Cardiologist (physician) and Advanced Practice Providers (APPs -  Physician Assistants and Nurse Practitioners) who all work together to provide you with the care you need, when you need it. You will need a follow up appointment in 3 months.  Please call our office 2 months in advance to schedule this appointment.  You may see Dr. Harrell Gave or one of the following Advanced Practice Providers on your designated Care Team:   Rosaria Ferries, PA-C . Jory Sims, DNP, ANP  Any Other Special Instructions Will Be Listed Below (If Applicable).  Please arrive at the [facility] main entrance of [facility] at xx:xx AM (30-45 minutes prior to test start time)  [facility] [place], [place] [address] [phone]  Proceed to the [facility] Radiology Department (First Floor).  Please follow these instructions carefully (unless otherwise directed):  On the Night Before the Test: . Be  sure to Drink plenty of water. . Do not consume any caffeinated/decaffeinated beverages or chocolate 12 hours prior to your test. . Do not take any antihistamines 12 hours prior to your  test.   On the Day of the Test: . Drink plenty of water. Do not drink any water within one hour of the test. . Do not eat any food 4 hours prior to the test. . You may take your regular medications prior to the test.  . Take metoprolol (Lopressor) two hours prior to test.       After the Test: . Drink plenty of water. . After receiving IV contrast, you may experience a mild flushed feeling. This is normal. . On occasion, you may experience a mild rash up to 24 hours after the test. This is not dangerous. If this occurs, you can take Benadryl 25 mg and increase your fluid intake. . If you experience trouble breathing, this can be serious. If it is severe call 911 IMMEDIATELY. If it is mild, please call our office.         Signed, Veronica Dresser, MD PhD 10/02/2018 3:15 PM    [place]

## 2018-10-02 NOTE — Patient Instructions (Addendum)
Medication Instructions:  Your Physician recommend you continue on your current medication as directed.   Print coupon at [email or web address] or [email or web address]    If you need a refill on your cardiac medications before your next appointment, please call your pharmacy.   Lab work: Your physician recommends that you return for lab work 1 week prior to procedure.  If you have labs (blood work) drawn today and your tests are completely normal, you will receive your results only by: Marland Kitchen MyChart Message (if you have MyChart) OR . A paper copy in the mail If you have any lab test that is abnormal or we need to change your treatment, we will call you to review the results.  Testing/Procedures: Your physician has requested that you have cardiac CT. Cardiac computed tomography (CT) is a painless test that uses an x-ray machine to take clear, detailed pictures of your heart. For further information please visit HugeFiesta.tn. Please follow instruction sheet as given. [facility]    Follow-Up: At [facility], you and your health needs are our priority.  As part of our continuing mission to provide you with exceptional heart care, we have created designated Provider Care Teams.  These Care Teams include your primary Cardiologist (physician) and Advanced Practice Providers (APPs -  Physician Assistants and Nurse Practitioners) who all work together to provide you with the care you need, when you need it. You will need a follow up appointment in 3 months.  Please call our office 2 months in advance to schedule this appointment.  You may see Dr. Harrell Gave or one of the following Advanced Practice Providers on your designated Care Team:   Rosaria Ferries, PA-C . Jory Sims, DNP, ANP  Any Other Special Instructions Will Be Listed Below (If Applicable).  Please arrive at the [facility] main entrance of [facility] at xx:xx AM (30-45 minutes prior to test start time)  [facility] [place], [place] [address] [phone]  Proceed to the [facility] Radiology Department (First Floor).  Please follow these instructions carefully (unless otherwise directed):  On the Night Before the Test: . Be sure to Drink plenty of water. . Do not consume any caffeinated/decaffeinated beverages or chocolate 12 hours prior to your test. . Do not take any antihistamines 12 hours prior to your test.   On the Day of the Test: . Drink plenty of water. Do not drink any water within one hour of the test. . Do not eat any food 4 hours prior to the test. . You may take your regular medications prior to the test.  . Take metoprolol (Lopressor) two hours prior to test.       After the Test: . Drink plenty of water. . After receiving IV contrast, you may experience a mild flushed feeling. This is normal. . On occasion, you may experience a mild rash up to 24 hours after the test. This is not dangerous. If this occurs, you can take Benadryl 25 mg and increase your fluid intake. . If you experience trouble breathing, this can be serious. If it is severe call 911 IMMEDIATELY. If it is mild, please call our office.

## 2018-10-03 ENCOUNTER — Telehealth: Payer: Self-pay

## 2018-10-03 DIAGNOSIS — E785 Hyperlipidemia, unspecified: Secondary | ICD-10-CM

## 2018-10-03 NOTE — Telephone Encounter (Signed)
Tried crestor (memory issues). No other statins tried. Also asked if pt had children with high ldl and not to the pts knowledge. Said that the pt would contact son to find out if they also had high cholesterol.

## 2018-10-12 ENCOUNTER — Other Ambulatory Visit: Payer: Self-pay | Admitting: Pharmacist

## 2018-10-12 MED ORDER — EVOLOCUMAB WITH INFUSOR 420 MG/3.5ML ~~LOC~~ SOCT: 420.0000 mg | SUBCUTANEOUS | 11 refills | Status: DC

## 2018-10-16 ENCOUNTER — Other Ambulatory Visit: Payer: Self-pay | Admitting: Pharmacist Clinician (PhC)/ Clinical Pharmacy Specialist

## 2018-10-16 MED ORDER — EVOLOCUMAB WITH INFUSOR 420 MG/3.5ML ~~LOC~~ SOCT: 420.0000 mg | SUBCUTANEOUS | 11 refills | Status: DC

## 2018-11-20 LAB — BASIC METABOLIC PANEL
BUN/Creatinine Ratio: 17 (ref 9–23)
BUN: 15 mg/dL (ref 6–24)
CALCIUM: 9.9 mg/dL (ref 8.7–10.2)
CHLORIDE: 101 mmol/L (ref 96–106)
CO2: 23 mmol/L (ref 20–29)
Creatinine, Ser: 0.86 mg/dL (ref 0.57–1.00)
GFR calc non Af Amer: 76 mL/min/{1.73_m2} (ref >59)
GFR, EST AFRICAN AMERICAN: 88 mL/min/{1.73_m2} (ref >59)
GLUCOSE: 116 mg/dL — AB (ref 65–99)
POTASSIUM: 4.7 mmol/L (ref 3.5–5.2)
Sodium: 141 mmol/L (ref 134–144)

## 2018-11-23 ENCOUNTER — Telehealth (HOSPITAL_COMMUNITY): Payer: Self-pay | Admitting: Emergency Medicine

## 2018-11-23 NOTE — Telephone Encounter (Signed)
Reaching out to patient to offer assistance regarding upcoming cardiac imaging study; pt verbalizes understanding of appt date/time, parking situation and where to check in, pre-test NPO status and medications ordered, and verified current allergies; name and call back number provided for further questions should they arise Jaidev Sanger RN Navigator Cardiac Imaging [facility] [phone] office [phone] cell 

## 2018-11-24 ENCOUNTER — Ambulatory Visit (HOSPITAL_COMMUNITY): Admission: RE | Admit: 2018-11-24 | Payer: Managed Care, Other (non HMO) | Source: Ambulatory Visit

## 2018-11-24 ENCOUNTER — Ambulatory Visit (HOSPITAL_COMMUNITY)
Admission: RE | Admit: 2018-11-24 | Discharge: 2018-11-24 | Disposition: A | Discharge status: Home / Self Care | Payer: Managed Care, Other (non HMO) | Source: Ambulatory Visit | Attending: Cardiology | Admitting: Cardiology

## 2018-11-24 DIAGNOSIS — R079 Chest pain, unspecified: Secondary | ICD-10-CM | POA: Insufficient documentation

## 2018-11-24 MED ORDER — NITROGLYCERIN 0.4 MG SL SUBL
SUBLINGUAL_TABLET | SUBLINGUAL | Status: AC
  Filled 2018-11-24: qty 2

## 2018-11-24 MED ORDER — NITROGLYCERIN 0.4 MG SL SUBL
0.8000 mg | SUBLINGUAL_TABLET | Freq: Once | SUBLINGUAL | Status: AC
  Administered 2018-11-24: 0.8 mg via SUBLINGUAL
  Filled 2018-11-24: qty 25

## 2018-11-24 MED ORDER — IOPAMIDOL (ISOVUE-370) INJECTION 76%
100.0000 mL | Freq: Once | INTRAVENOUS | Status: AC | PRN
  Administered 2018-11-24: 100 mL via INTRAVENOUS

## 2018-11-28 ENCOUNTER — Other Ambulatory Visit: Payer: Self-pay | Admitting: Family Medicine

## 2018-11-29 NOTE — Telephone Encounter (Signed)
Called to find out if the pt was still taking repatha and ordered labs instructed pt to come in and have them done fasting if still taking ONLY or, if not; to call the office and let us know.

## 2018-11-29 NOTE — Addendum Note (Signed)
Addended by: Allean Found on: 11/29/2018 04:14 PM   Modules accepted: Orders

## 2018-12-12 ENCOUNTER — Telehealth: Payer: Self-pay

## 2018-12-12 NOTE — Telephone Encounter (Signed)
Called pt to find out if they are still taking repatha and the pt stated that they were and that they will come to complete the labs by the end of the week for

## 2018-12-25 ENCOUNTER — Other Ambulatory Visit: Payer: Self-pay

## 2018-12-25 ENCOUNTER — Telehealth: Payer: Self-pay

## 2018-12-25 DIAGNOSIS — E785 Hyperlipidemia, unspecified: Secondary | ICD-10-CM

## 2018-12-25 LAB — LIPID PANEL
Chol/HDL Ratio: 5.1 ratio — ABNORMAL HIGH (ref 0.0–4.4)
Cholesterol, Total: 248 mg/dL — ABNORMAL HIGH (ref 100–199)
HDL: 49 mg/dL (ref >39)
LDL Calculated: 160 mg/dL — ABNORMAL HIGH (ref 0–99)
Triglycerides: 193 mg/dL — ABNORMAL HIGH (ref 0–149)
VLDL Cholesterol Cal: 39 mg/dL (ref 5–40)

## 2018-12-25 NOTE — Telephone Encounter (Signed)
Called pt to let them know that they need labs for repatha asap

## 2019-01-02 ENCOUNTER — Ambulatory Visit: Payer: Managed Care, Other (non HMO) | Admitting: Cardiology

## 2019-01-02 ENCOUNTER — Encounter: Payer: Self-pay | Admitting: Cardiology

## 2019-01-02 VITALS — BP 134/86 | HR 95 | Ht 68.0 in | Wt 203.4 lb

## 2019-01-02 DIAGNOSIS — Z713 Dietary counseling and surveillance: Secondary | ICD-10-CM

## 2019-01-02 DIAGNOSIS — I10 Essential (primary) hypertension: Secondary | ICD-10-CM

## 2019-01-02 DIAGNOSIS — Z79899 Other long term (current) drug therapy: Secondary | ICD-10-CM | POA: Diagnosis not present

## 2019-01-02 DIAGNOSIS — E6609 Other obesity due to excess calories: Secondary | ICD-10-CM

## 2019-01-02 DIAGNOSIS — E7800 Pure hypercholesterolemia, unspecified: Secondary | ICD-10-CM | POA: Diagnosis not present

## 2019-01-02 DIAGNOSIS — Z6830 Body mass index (BMI) 30.0-30.9, adult: Secondary | ICD-10-CM

## 2019-01-02 NOTE — Patient Instructions (Signed)
Medication Instructions:  Your Physician recommend you continue on your current medication as directed.    If you need a refill on your cardiac medications before your next appointment, please call your pharmacy.   Lab work: None  Testing/Procedures: None  Follow-Up: At [facility], you and your health needs are our priority.  As part of our continuing mission to provide you with exceptional heart care, we have created designated Provider Care Teams.  These Care Teams include your primary Cardiologist (physician) and Advanced Practice Providers (APPs -  Physician Assistants and Nurse Practitioners) who all work together to provide you with the care you need, when you need it. You will need a follow up appointment in 6 months.  Please call our office 2 months in advance to schedule this appointment.  You may see Buford Dresser, MD or one of the following Advanced Practice Providers on your designated Care Team:   Rosaria Ferries, PA-C . Jory Sims, DNP, ANP

## 2019-01-02 NOTE — Progress Notes (Signed)
Cardiology Office Note:    Date:  01/02/2019   ID:  Teyonna Plaisted Harrington, DOB 1961-07-16, MRN [id number]  PCP:  Abner Greenspan, MD  Cardiologist:  Buford Dresser, MD PhD  Referring MD: Abner Greenspan, MD   CC: follow up  History of Present Illness:    Veronica [name] is a 58 y.o. female with a hx of hyperlipidemia, hypertension who is seen in follow up. She was initially seen 10/02/18 as a new consult at the request of [name], [name], MD for the evaluation and management of family history of early CAD with hyperlipidemia. Last LDL was 262.  Cardiac history: see family history below. No personal history of xanthoma. Tried statin in the past (can't remember which one, per chart likely rosuvastatin), had severe memory issues with it. Had fatigue on beta blocker. Has anxiety. Has done fine with anesthesia in the past for procedures. Had a stress test in 2016 (echo stress). No ischemia on imaging. No recent syncope (vagal during teenage years).   Today: gaining some weight, but husband is cooking, lots of carbs. Has been traveling with her job, has been eating out three meals/day. Taking repatha on the 10th, but malfunctioned, took again 5 days later. Blood work is from 4 days after the dose. Has had a total of 3 doses of repatha. Difficult to compare to prior lipids, as prior note non-HDL (but no measured LDL). Based on the estimate, repatha has improved LDL from October but is worse than LDL when she was on a statin (wsa 101). Direct LDL was 264 in the past before statin. Veronica [name] has been screened, not sure of results. Veronica [name] knows that she should be screened.  Past Medical History:  Diagnosis Date  . Acne   . Colonic mass 1/10  . Crohn's disease ([place])   . Depression   . Edema   . Hyperlipidemia   . Hypertension   . PMS (premenstrual syndrome)     Past Surgical History:  Procedure Laterality Date  . HEMICOLECTOMY  1/10    Current Medications: Current Outpatient Medications on  File Prior to Visit  Medication Sig  . clonazePAM (KLONOPIN) 0.5 MG tablet Take 1 tablet (0.5 mg total) by mouth daily as needed for anxiety.  . Evolocumab with Infusor (Monette) 420 MG/3.5ML SOCT Inject 420 mg into the skin every 30 (thirty) days.  Marland Kitchen losartan (COZAAR) 25 MG tablet Take 2 tablets (50 mg total) by mouth daily.  . sertraline (ZOLOFT) 50 MG tablet Take 1 tablet (50 mg total) by mouth daily.  Marland Kitchen spironolactone (ALDACTONE) 25 MG tablet TAKE 1/2 TABLETS BY MOUTH DAILY.   No current facility-administered medications on file prior to visit.      Allergies:   Hydrochlorothiazide; Ace inhibitors; Crestor [rosuvastatin calcium]; Fluoxetine hcl; Tenormin [atenolol]; Venlafaxine; and Bupropion hcl   Social History   Socioeconomic History  . Marital status: Married    Spouse name: Not on file  . Number of children: 2  . Years of education: Not on file  . Highest education level: Not on file  Occupational History  . Occupation: Wellsite geologist  Social Needs  . Financial resource strain: Not on file  . Food insecurity:    Worry: Not on file    Inability: Not on file  . Transportation needs:    Medical: Not on file    Non-medical: Not on file  Tobacco Use  . Smoking status: Never Smoker  . Smokeless tobacco: Never Used  Substance and Sexual Activity  . Alcohol use: No    Alcohol/week: 0.0 standard drinks  . Drug use: No  . Sexual activity: Not on file  Lifestyle  . Physical activity:    Days per week: Not on file    Minutes per session: Not on file  . Stress: Not on file  Relationships  . Social connections:    Talks on phone: Not on file    Gets together: Not on file    Attends religious service: Not on file    Active member of club or organization: Not on file    Attends meetings of clubs or organizations: Not on file    Relationship status: Not on file  Other Topics Concern  . 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   Father: Cholesterol was over 400. Died age 40, ischemic heart disease. Had history of plaque in his legs causing clots. Unclear cause of death, either stroke or aneurysm. Mother: High cholesterol, total 258. On metoprolol only. On no meds for cholesterol Veronica [name]: died age 88 of MI Veronica [name]: died about age 55, had prior brain aneurysm 11 years prior to her death, died of stroke Veronica [name]: polymyalgia and giant cell arteritis, died age 63 of intestinal issues Veronica [name]: died of MI at age 52. Brother: 5 years [place], unknown medical history other than alcoholism Veronica [name]: 29 sisters, 3 brothers, many with high cholesterol, stents including carotid and PAD, many with ischemic heart disease  Has two children, Veronica [name] age 39 and Veronica [name] age 46. No known health issues  ROS:   Please see the history of present illness.  Additional pertinent ROS:  Constitutional: Negative for chills, fever, night sweats, unintentional weight loss  HENT: Negative for ear pain and hearing loss.   Eyes: Negative for loss of vision and eye pain.  Respiratory: Positive for shortness of breath with exertion. Negative for cough, sputum, shortness of breath, wheezing.   Cardiovascular: Positive for fatigue with exertion. Negative for chest pain, palpitations,  PND, orthopnea, lower extremity edema and claudication.  Gastrointestinal: Negative for abdominal pain, melena, and hematochezia.  Genitourinary: Negative for dysuria and hematuria.  Musculoskeletal: Negative for falls and myalgias.  Skin: Negative for itching and rash.  Neurological: Negative for focal weakness, focal sensory changes and loss of consciousness.  Endo/Heme/Allergies: Does not bruise/bleed easily.    EKGs/Labs/Other Studies Reviewed:    The following studies were reviewed today: CT coronary 11/24/18 1. Coronary calcium score of 0. This was 0 percentile for age and sex matched control. 2. Normal coronary origin with right dominance. 3. No evidence of CAD.  Stress echo 05/15/15 - Stress ECG conclusions: 1 mm ST segment depressin in lateral   leads during recovery Non diagnostic due to baseline changes.   There were no stress arrhythmias  or conduction abnormalities. The   stress ECG was negative for ischemia. - Staged echo: There was no echocardiographic evidence for   stress-induced ischemia. - Impressions: Low risk study Normal resting and stress echo   images. ECG with baseline changes and abnormal ST depression in   lateral leads in recovery.  Impressions: - Low risk study Normal resting and stress echo images. ECG with   baseline changes and abnormal ST depression in lateral leads in   recovery.   EKG:  EKG is personally reviewed.  The ekg ordered 10/02/18 demonstrates normal sinus rhythm with nonspecific T wave changes  Recent Labs: 08/31/2018: ALT 25; Hemoglobin 13.8; Platelets 393.0; TSH 2.05 11/20/2018: BUN 15; Creatinine, Ser 0.86; Potassium 4.7; Sodium 141  Recent Lipid Panel    Component Value Date/Time   CHOL 248 (H) 12/25/2018 0831   TRIG 193 (H) 12/25/2018 0831   HDL 49 12/25/2018 0831   CHOLHDL 5.1 (H) 12/25/2018 0831   CHOLHDL 9 08/31/2018 0739   VLDL 49.6 (H) 08/31/2018 0739   LDLCALC 160 (H) 12/25/2018 0831   LDLDIRECT 262.0 08/31/2018  0739    Physical Exam:    VS:  BP 134/86   Pulse 95   Ht 5' 8"  (1.727 m)   Wt 203 lb 6.4 oz (92.3 kg)   SpO2 97%   BMI 30.93 kg/m    No data found.  Wt Readings from Last 3 Encounters:  01/02/19 203 lb 6.4 oz (92.3 kg)  10/02/18 195 lb (88.5 kg)  09/05/18 196 lb 8 oz (89.1 kg)     GEN: Well nourished, well developed in no acute distress HEENT: Normal NECK: No JVD; No carotid bruits LYMPHATICS: No lymphadenopathy CARDIAC: regular rhythm, normal S1 and S2, no murmurs, rubs, gallops. Radial and DP pulses 2+ bilaterally. RESPIRATORY:  Clear to auscultation without rales, wheezing or rhonchi  ABDOMEN: Soft, non-tender, non-distended MUSCULOSKELETAL:  No edema; No deformity  SKIN: Warm and dry NEUROLOGIC:  Alert and oriented x 3 PSYCHIATRIC:  Normal affect   ASSESSMENT:    1. Pure hypercholesterolemia   2. Medication management   3. Nutritional counseling   4. Class 1 obesity due to excess calories without serious comorbidity with body mass index (BMI) of 30.0 to 30.9 in adult   5. Essential hypertension    PLAN:    Hypercholesterolemia: Likely heterozygous familial hypercholesterolemia -children should be screened with lipid panel, and discussed option of genetic testing -coronary CTA without evidence of established CAD, so her symptoms are not likely cardiac in origin -initiated PCSK9 inhibitor 09/2018, prior direct LDL 264 not on statin -med management: lipids at 3 mos, 6 mos, then yearly on PCSK9 inhibitor. 3 mos completed. -has not tolerated statin in the past due to memory issues.   Hypertension: goal <130/80, near goal today, going to work on lifestyle -continue losartan and spironolactone  Prevention and health promotion counseling -recommend heart healthy/Mediterranean diet, with whole grains, fruits, vegetable, fish, lean meats, nuts, and olive oil. Limit salt. -recommend moderate walking, 3-5 times/week for 30-50 minutes each session. Aim for at least 150  minutes.week. Goal should be pace of 3 miles/hours, or walking 1.5 miles in 30 minutes -recommend avoidance of tobacco products. Avoid excess alcohol. -Additional risk factor control:  -Diabetes: A1c is 5.8  -Lipids: as above  -Blood pressure control: as above  -Weight: BMI 30, class 1 obesity. Discussed weight loss, diet, and exercise today  Plan for follow up: 6 mos  TIME SPENT WITH PATIENT:  25 minutes of  direct patient care. More than 50% of that time was spent on coordination of care and counseling regarding PCSK9 inhibitor, nutrion and exercise counseling, healthy lifestyle recommendatoins.  Buford Dresser, MD, PhD [place]  [facility]   Medication Adjustments/Labs and Tests Ordered: Current medicines are reviewed at length with the patient today.  Concerns regarding medicines are outlined above.  No orders of the defined types were placed in this encounter.  No orders of the defined types were placed in this encounter.   Patient Instructions  Medication Instructions:  Your Physician recommend you continue on your current medication as directed.    If you need a refill on your cardiac medications before your next appointment, please call your pharmacy.   Lab work: None  Testing/Procedures: None  Follow-Up: At [facility], you and your health needs are our priority.  As part of our continuing mission to provide you with exceptional heart care, we have created designated Provider Care Teams.  These Care Teams include your primary Cardiologist (physician) and Advanced Practice Providers (APPs -  Physician Assistants and Nurse Practitioners) who all work together to provide you with the care you need, when you need it. You will need a follow up appointment in 6 months.  Please call our office 2 months in advance to schedule this appointment.  You may see Buford Dresser, MD or one of the following Advanced Practice Providers on your designated Care Team:    Rosaria Ferries, PA-C . Jory Sims, DNP, ANP       Signed, Buford Dresser, MD PhD 01/02/2019 9:41 AM    [place]

## 2019-01-06 ENCOUNTER — Other Ambulatory Visit: Payer: Self-pay | Admitting: Family Medicine

## 2019-03-23 ENCOUNTER — Telehealth: Payer: Self-pay

## 2019-03-26 NOTE — Telephone Encounter (Signed)
Will follow up

## 2019-04-04 NOTE — Telephone Encounter (Signed)
PA extended by insurance until 07/05/2019 due to COVID

## 2019-05-16 ENCOUNTER — Other Ambulatory Visit: Payer: Self-pay | Admitting: Family Medicine

## 2019-05-23 ENCOUNTER — Telehealth: Payer: Self-pay | Admitting: Cardiology

## 2019-05-23 DIAGNOSIS — Z79899 Other long term (current) drug therapy: Secondary | ICD-10-CM

## 2019-05-23 NOTE — Telephone Encounter (Signed)
°  Patient called and wanted to know if she needs lab work done before her appointment on 08/31. Please advise

## 2019-05-23 NOTE — Telephone Encounter (Signed)
Returned call to patient she wanted to know if she needed lab before her appointment.Advised I will send message to Dr.Christopher's RN.

## 2019-05-24 NOTE — Telephone Encounter (Signed)
Yes, she will need a lipid panel prior to her appointment. It would be best if it was fasting. We can order, and she can come by the office at her convenience. Thanks.

## 2019-05-24 NOTE — Telephone Encounter (Signed)
Left message to call back  

## 2019-05-25 NOTE — Telephone Encounter (Signed)
Left message to call back  

## 2019-05-29 NOTE — Telephone Encounter (Signed)
Pt updated and lab slip mailed.

## 2019-05-29 NOTE — Addendum Note (Signed)
Addended by: Meryl Crutch on: 05/29/2019 09:55 AM   Modules accepted: Orders

## 2019-06-29 LAB — LIPID PANEL
Chol/HDL Ratio: 3.9 ratio (ref 0.0–4.4)
Cholesterol, Total: 174 mg/dL (ref 100–199)
HDL: 45 mg/dL (ref >39)
LDL Calculated: 100 mg/dL — ABNORMAL HIGH (ref 0–99)
Triglycerides: 147 mg/dL (ref 0–149)
VLDL Cholesterol Cal: 29 mg/dL (ref 5–40)

## 2019-07-09 ENCOUNTER — Other Ambulatory Visit: Payer: Self-pay

## 2019-07-09 ENCOUNTER — Ambulatory Visit: Payer: Managed Care, Other (non HMO) | Admitting: Cardiology

## 2019-07-09 VITALS — BP 140/90 | HR 94 | Temp 97.3°F | Ht 68.5 in | Wt 208.0 lb

## 2019-07-09 DIAGNOSIS — E7800 Pure hypercholesterolemia, unspecified: Secondary | ICD-10-CM

## 2019-07-09 DIAGNOSIS — Z713 Dietary counseling and surveillance: Secondary | ICD-10-CM

## 2019-07-09 DIAGNOSIS — Z6831 Body mass index (BMI) 31.0-31.9, adult: Secondary | ICD-10-CM

## 2019-07-09 DIAGNOSIS — Z7182 Exercise counseling: Secondary | ICD-10-CM

## 2019-07-09 DIAGNOSIS — Z7189 Other specified counseling: Secondary | ICD-10-CM | POA: Diagnosis not present

## 2019-07-09 DIAGNOSIS — E6609 Other obesity due to excess calories: Secondary | ICD-10-CM

## 2019-07-09 DIAGNOSIS — I10 Essential (primary) hypertension: Secondary | ICD-10-CM

## 2019-07-09 NOTE — Progress Notes (Signed)
Cardiology Office Note:    Date:  07/09/2019   ID:  Veronica [name], DOB 1960/11/26, MRN [id number]  PCP:  Abner Greenspan, MD  Cardiologist:  Buford Dresser, MD PhD  Referring MD: Abner Greenspan, MD   CC: follow up  History of Present Illness:    Veronica [name] is a 58 y.o. female with a hx of hyperlipidemia, hypertension who is seen in follow up. She was initially seen 10/02/18 as a new consult at the request of [name], [name], MD for the evaluation and management of family history of early CAD with hyperlipidemia. Last LDL was 262.  Cardiac history: see family history below. No personal history of xanthoma. Tried statin in the past (can't remember which one, per chart likely rosuvastatin), had severe memory issues with it. Had fatigue on beta blocker. Has anxiety. Has done fine with anesthesia in the past for procedures. Had a stress test in 2016 (echo stress). No ischemia on imaging. No recent syncope (vagal during teenage years).   Today:  Doing well on PCSK9i, though diet and exercise have been poor since coronavirus. No issues with repatha. LDL recheck several weeks ago was 100, down from LDL 160 (and peak LDL >260). Discussed that statin would lower further, but she cannot tolerate them.   Denies chest pain, shortness of breath at rest or with normal exertion. No PND, orthopnea, LE edema or unexpected weight gain. No syncope or palpitations.  Doesn't routinely check BP at home. Had slightly elevated BP at dentist office when she was stressed. Always runs higher in the office than when she used to check at home.  Past Medical History:  Diagnosis Date  . Acne   . Colonic mass 1/10  . Crohn's disease ([place])   . Depression   . Edema   . Hyperlipidemia   . Hypertension   . PMS (premenstrual syndrome)     Past Surgical History:  Procedure Laterality Date  . HEMICOLECTOMY  1/10    Current Medications: Current Outpatient Medications on File Prior to Visit   Medication Sig  . clonazePAM (KLONOPIN) 0.5 MG tablet Take 1 tablet (0.5 mg total) by mouth daily as needed for anxiety.  . Evolocumab with Infusor ([place]) 420 MG/3.5ML SOCT Inject 420 mg into the skin every 30 (thirty) days.  Marland Kitchen losartan (COZAAR) 50 MG tablet TAKE 1 TABLET BY MOUTH DAILY  . sertraline (ZOLOFT) 50 MG tablet Take 1 tablet (50 mg total) by mouth daily.  Marland Kitchen spironolactone (ALDACTONE) 25 MG tablet TAKE 1/2 TABLETS BY MOUTH DAILY.   No current facility-administered medications on file prior to visit.      Allergies:   Hydrochlorothiazide, Ace inhibitors, Crestor [rosuvastatin calcium], Fluoxetine hcl, Tenormin [atenolol], Venlafaxine, and Bupropion hcl   Social History   Socioeconomic History  . Marital status: Married    Spouse name: Not on file  . Number of children: 2  . Years of education: Not on file  . Highest education level: Not on file  Occupational History  . Occupation: Wellsite geologist  Social Needs  . Financial resource strain: Not on file  . Food insecurity    Worry: Not on file    Inability: Not on file  . Transportation needs    Medical: Not on file    Non-medical: Not on file  Tobacco Use  . Smoking status: Never Smoker  . Smokeless tobacco: Never Used  Substance and Sexual Activity  . Alcohol use: No  Alcohol/week: 0.0 standard drinks  . Drug use: No  . Sexual activity: Not on file  Lifestyle  . Physical activity    Days per week: Not on file    Minutes per session: Not on file  . Stress: Not on file  Relationships  . Social Herbalist on phone: Not on file    Gets together: Not on file    Attends religious service: Not on file    Active member of club or organization: Not on file    Attends meetings of clubs or organizations: Not on file    Relationship status: Not on file  Other Topics Concern  . 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   Father: Cholesterol was over 400. Died age 7, ischemic heart disease. Had history of plaque in his legs causing clots. Unclear cause of death, either stroke or aneurysm. Mother: High cholesterol, total 258. On metoprolol only. On no meds for cholesterol [name]: died age 27 of MI [name]: died about age 38, had prior brain aneurysm 11 years prior to her death, died of stroke [name]: polymyalgia and giant cell arteritis, died age 31 of intestinal issues [name]: died of MI at age 53. Brother: 5 years [place], unknown medical history other than alcoholism [name]: 59 sisters, 3 brothers, many with high cholesterol, stents including carotid and PAD, many with ischemic heart disease  Has two children, son age 58 and daughter age 34. No known health issues  ROS:   Please see the history of present illness.  Additional pertinent ROS: Constitutional: Negative for chills, fever, night sweats, unintentional weight loss  HENT: Negative for ear pain and hearing loss.   Eyes: Negative for loss of vision and eye pain.  Respiratory: Negative for cough, sputum, wheezing.  Positive for mild dyspnea on exertion Cardiovascular: See HPI. Gastrointestinal: Negative for abdominal pain, melena, and hematochezia.  Genitourinary: Negative for dysuria and hematuria.  Musculoskeletal:  Negative for falls and myalgias.  Skin: Negative for itching and rash.  Neurological: Negative for focal weakness, focal sensory changes and loss of consciousness.  Endo/Heme/Allergies: Does not bruise/bleed easily.    EKGs/Labs/Other Studies Reviewed:    The following studies were reviewed today: CT coronary 11/24/18 1. Coronary calcium score of 0. This was 0 percentile for age and sex matched control. 2. Normal coronary origin with right dominance. 3. No evidence of CAD.  Stress echo 05/15/15 - Stress ECG conclusions: 1 mm ST segment depressin in lateral   leads during recovery Non diagnostic due to baseline changes.   There were no stress arrhythmias or conduction abnormalities. The   stress ECG was negative for ischemia. - Staged echo: There was no echocardiographic evidence for   stress-induced ischemia. - Impressions: Low risk study Normal resting and stress  echo   images. ECG with baseline changes and abnormal ST depression in   lateral leads in recovery.  Impressions: - Low risk study Normal resting and stress echo images. ECG with   baseline changes and abnormal ST depression in lateral leads in   recovery.   EKG:  EKG is personally reviewed.  The ekg ordered 10/02/18 demonstrates normal sinus rhythm with nonspecific T wave changes  Recent Labs: 08/31/2018: ALT 25; Hemoglobin 13.8; Platelets 393.0; TSH 2.05 11/20/2018: BUN 15; Creatinine, Ser 0.86; Potassium 4.7; Sodium 141  Recent Lipid Panel    Component Value Date/Time   CHOL 174 06/29/2019 0912   TRIG 147 06/29/2019 0912   HDL 45 06/29/2019 0912   CHOLHDL 3.9 06/29/2019 0912   CHOLHDL 9 08/31/2018 0739   VLDL 49.6 (H) 08/31/2018 0739   LDLCALC 100 (H) 06/29/2019 0912   LDLDIRECT 262.0 08/31/2018 0739    Physical Exam:    VS:  BP 140/90   Pulse 94   Temp (!) 97.3 F (36.3 C)   Ht 5' 8.5" (1.74 m)   Wt 208 lb (94.3 kg)   SpO2 96%   BMI 31.17 kg/m    No data found.  Wt Readings from Last 3  Encounters:  07/09/19 208 lb (94.3 kg)  01/02/19 203 lb 6.4 oz (92.3 kg)  10/02/18 195 lb (88.5 kg)    GEN: Well nourished, well developed in no acute distress HEENT: Normal, moist mucous membranes NECK: No JVD CARDIAC: regular rhythm, normal S1 and S2, no murmurs, rubs, gallops.  VASCULAR: Radial and DP pulses 2+ bilaterally. No carotid bruits RESPIRATORY:  Clear to auscultation without rales, wheezing or rhonchi  ABDOMEN: Soft, non-tender, non-distended MUSCULOSKELETAL:  Ambulates independently SKIN: Warm and dry, no edema NEUROLOGIC:  Alert and oriented x 3. No focal neuro deficits noted. PSYCHIATRIC:  Normal affect    ASSESSMENT:    1. Pure hypercholesterolemia   2. Essential hypertension   3. Counseling on health promotion and disease prevention   4. Exercise counseling   5. Nutritional counseling    PLAN:    Hypercholesterolemia: Likely heterozygous familial hypercholesterolemia -children should be screened with lipid panel, and discussed option of genetic testing. She has discussed this with her children -coronary CTA without evidence of established CAD -initiated PCSK9 inhibitor 09/2018, prior direct LDL 264 not on statin -med management: lipids at 3 mos, 6 mos, then yearly on PCSK9 inhibitor. Has had 3 mos and 6 mos checks. Will order lipids for 3 mos from now (1 year mark) -has not tolerated statin in the past due to memory issues.  -counseled on both nutrition and exercise in addition to PCSK9 inhibitor. She will try to get back on track with this  Hypertension: goal <130/80. Above goal today. Discussed medication changes vs. Lifestyle. She would like to work on lifestyle improvement for now. -continue losartan and spironolactone as prior  Prevention and health promotion counseling -recommend heart healthy/Mediterranean diet, with whole grains, fruits, vegetable, fish, lean meats, nuts, and olive oil. Limit salt. -recommend moderate walking, 3-5 times/week for 30-50  minutes each session. Aim for at least 150 minutes.week. Goal should be pace of 3 miles/hours, or walking 1.5 miles in 30 minutes -recommend avoidance of tobacco products. Avoid excess alcohol. -Additional risk factor control:  -Diabetes: A1c is 5.8  -Lipids: as above  -Blood pressure control: as above  -Weight: BMI 31, class 1 obesity. Discussed weight loss, diet, and exercise today  Plan for follow up: 6 mos  Medication Adjustments/Labs  and Tests Ordered: Current medicines are reviewed at length with the patient today.  Concerns regarding medicines are outlined above.  Orders Placed This Encounter  Procedures  . Lipid panel   No orders of the defined types were placed in this encounter.   Patient Instructions  Medication Instructions:  Your Physician recommend you continue on your current medication as directed.    If you need a refill on your cardiac medications before your next appointment, please call your pharmacy.   Lab work: Your physician recommends that you return for lab work in 6 months (Lipid)  If you have labs (blood work) drawn today and your tests are completely normal, you will receive your results only by: Marland Kitchen MyChart Message (if you have MyChart) OR . A paper copy in the mail If you have any lab test that is abnormal or we need to change your treatment, we will call you to review the results.  Testing/Procedures: None  Follow-Up: At [facility], you and your health needs are our priority.  As part of our continuing mission to provide you with exceptional heart care, we have created designated Provider Care Teams.  These Care Teams include your primary Cardiologist (physician) and Advanced Practice Providers (APPs -  Physician Assistants and Nurse Practitioners) who all work together to provide you with the care you need, when you need it. You will need a follow up appointment in 6 months.  Please call our office 2 months in advance to schedule this  appointment.  You may see Buford Dresser, MD or one of the following Advanced Practice Providers on your designated Care Team:   Rosaria Ferries, PA-C . Jory Sims, DNP, ANP        Signed, Buford Dresser, MD PhD 07/09/2019  [facility]

## 2019-07-09 NOTE — Patient Instructions (Signed)
Medication Instructions:  Your Physician recommend you continue on your current medication as directed.    If you need a refill on your cardiac medications before your next appointment, please call your pharmacy.   Lab work: Your physician recommends that you return for lab work in 6 months (Lipid)  If you have labs (blood work) drawn today and your tests are completely normal, you will receive your results only by: Marland Kitchen MyChart Message (if you have MyChart) OR . A paper copy in the mail If you have any lab test that is abnormal or we need to change your treatment, we will call you to review the results.  Testing/Procedures: None  Follow-Up: At [facility], you and your health needs are our priority.  As part of our continuing mission to provide you with exceptional heart care, we have created designated Provider Care Teams.  These Care Teams include your primary Cardiologist (physician) and Advanced Practice Providers (APPs -  Physician Assistants and Nurse Practitioners) who all work together to provide you with the care you need, when you need it. You will need a follow up appointment in 6 months.  Please call our office 2 months in advance to schedule this appointment.  You may see Buford Dresser, MD or one of the following Advanced Practice Providers on your designated Care Team:   Rosaria Ferries, PA-C . Jory Sims, DNP, ANP

## 2019-07-16 ENCOUNTER — Encounter: Payer: Self-pay | Admitting: Cardiology

## 2019-07-18 ENCOUNTER — Telehealth: Payer: Self-pay | Admitting: Cardiology

## 2019-07-18 NOTE — Telephone Encounter (Signed)
Working on it.

## 2019-07-18 NOTE — Telephone Encounter (Signed)
New message   [facility] is calling to check on the status of repatha pushtronex 420 mg. Please reference prescription # [id number] when returning call.

## 2019-07-18 NOTE — Telephone Encounter (Signed)
[facility] states patient has been on Repatha over a year and needs a new authorization, can you help with this?

## 2019-08-20 ENCOUNTER — Other Ambulatory Visit: Payer: Self-pay | Admitting: Family Medicine

## 2019-08-20 MED ORDER — CLONAZEPAM 0.5 MG PO TABS: 0.5000 mg | ORAL_TABLET | Freq: Every day | ORAL | 0 refills | Status: DC | PRN

## 2019-08-20 NOTE — Telephone Encounter (Signed)
Name of Medication: [place] Name of Pharmacy: [facility] or Written Date and Quantity: 09/08/18 #30 tabs with 0 refills  Last Office Visit and Type: CPE on 09/05/18 Next Office Visit and Type: CPE on 09/19/19 Last Controlled Substance Agreement Date: n/a Last UDS:n/a

## 2019-08-21 ENCOUNTER — Other Ambulatory Visit: Payer: Self-pay | Admitting: *Deleted

## 2019-08-21 MED ORDER — SERTRALINE HCL 50 MG PO TABS: 50.0000 mg | ORAL_TABLET | Freq: Every day | ORAL | 0 refills | Status: DC

## 2019-09-09 ENCOUNTER — Telehealth: Payer: Self-pay | Admitting: Family Medicine

## 2019-09-09 DIAGNOSIS — I10 Essential (primary) hypertension: Secondary | ICD-10-CM

## 2019-09-09 DIAGNOSIS — R7303 Prediabetes: Secondary | ICD-10-CM

## 2019-09-09 DIAGNOSIS — E7800 Pure hypercholesterolemia, unspecified: Secondary | ICD-10-CM

## 2019-09-09 NOTE — Telephone Encounter (Signed)
-----   Message from Ellamae Sia sent at 09/03/2019 11:32 AM EDT ----- Regarding: Lab orders for Monday, 11.2.20 Patient is scheduled for CPX labs, please order future labs, Thanks , Karna Christmas

## 2019-09-10 ENCOUNTER — Other Ambulatory Visit: Payer: Self-pay

## 2019-09-10 ENCOUNTER — Other Ambulatory Visit (INDEPENDENT_AMBULATORY_CARE_PROVIDER_SITE_OTHER): Payer: Self-pay

## 2019-09-10 DIAGNOSIS — R7303 Prediabetes: Secondary | ICD-10-CM

## 2019-09-10 DIAGNOSIS — E7800 Pure hypercholesterolemia, unspecified: Secondary | ICD-10-CM

## 2019-09-10 DIAGNOSIS — I10 Essential (primary) hypertension: Secondary | ICD-10-CM

## 2019-09-10 LAB — LIPID PANEL
Cholesterol: 141 mg/dL (ref 0–200)
HDL: 41.2 mg/dL (ref >39.00)
NonHDL: 99.79
Total CHOL/HDL Ratio: 3
Triglycerides: 242 mg/dL — ABNORMAL HIGH (ref 0.0–149.0)
VLDL: 48.4 mg/dL — ABNORMAL HIGH (ref 0.0–40.0)

## 2019-09-10 LAB — CBC WITH DIFFERENTIAL/PLATELET
Basophils Absolute: 0.1 10*3/uL (ref 0.0–0.1)
Basophils Relative: 1.5 % (ref 0.0–3.0)
Eosinophils Absolute: 0.2 10*3/uL (ref 0.0–0.7)
Eosinophils Relative: 2.8 % (ref 0.0–5.0)
HCT: 40.9 % (ref 36.0–46.0)
Hemoglobin: 13.3 g/dL (ref 12.0–15.0)
Lymphocytes Relative: 33.2 % (ref 12.0–46.0)
Lymphs Abs: 2.5 10*3/uL (ref 0.7–4.0)
MCHC: 32.5 g/dL (ref 30.0–36.0)
MCV: 88.6 fl (ref 78.0–100.0)
Monocytes Absolute: 0.5 10*3/uL (ref 0.1–1.0)
Monocytes Relative: 7 % (ref 3.0–12.0)
Neutro Abs: 4.2 10*3/uL (ref 1.4–7.7)
Neutrophils Relative %: 55.5 % (ref 43.0–77.0)
Platelets: 385 10*3/uL (ref 150.0–400.0)
RBC: 4.62 Mil/uL (ref 3.87–5.11)
RDW: 13.7 % (ref 11.5–15.5)
WBC: 7.6 10*3/uL (ref 4.0–10.5)

## 2019-09-10 LAB — COMPREHENSIVE METABOLIC PANEL
ALT: 41 U/L — ABNORMAL HIGH (ref 0–35)
AST: 24 U/L (ref 0–37)
Albumin: 4 g/dL (ref 3.5–5.2)
Alkaline Phosphatase: 135 U/L — ABNORMAL HIGH (ref 39–117)
BUN: 13 mg/dL (ref 6–23)
CO2: 24 mEq/L (ref 19–32)
Calcium: 9.2 mg/dL (ref 8.4–10.5)
Chloride: 103 mEq/L (ref 96–112)
Creatinine, Ser: 0.83 mg/dL (ref 0.40–1.20)
GFR: 70.93 mL/min (ref >60.00)
Glucose, Bld: 105 mg/dL — ABNORMAL HIGH (ref 70–99)
Potassium: 3.9 mEq/L (ref 3.5–5.1)
Sodium: 139 mEq/L (ref 135–145)
Total Bilirubin: 0.3 mg/dL (ref 0.2–1.2)
Total Protein: 6.9 g/dL (ref 6.0–8.3)

## 2019-09-10 LAB — LDL CHOLESTEROL, DIRECT: Direct LDL: 71 mg/dL

## 2019-09-10 LAB — HEMOGLOBIN A1C: Hgb A1c MFr Bld: 5.9 % (ref 4.6–6.5)

## 2019-09-10 LAB — TSH: TSH: 4.49 u[IU]/mL (ref 0.35–4.50)

## 2019-09-12 ENCOUNTER — Encounter: Payer: Managed Care, Other (non HMO) | Admitting: Family Medicine

## 2019-09-18 ENCOUNTER — Other Ambulatory Visit: Payer: Self-pay | Admitting: Pharmacist

## 2019-09-18 MED ORDER — REPATHA PUSHTRONEX SYSTEM 420 MG/3.5ML ~~LOC~~ SOCT: 420.0000 mg | SUBCUTANEOUS | 11 refills | Status: DC

## 2019-09-19 ENCOUNTER — Other Ambulatory Visit: Payer: Self-pay

## 2019-09-19 ENCOUNTER — Encounter: Payer: Self-pay | Admitting: Family Medicine

## 2019-09-19 ENCOUNTER — Ambulatory Visit (INDEPENDENT_AMBULATORY_CARE_PROVIDER_SITE_OTHER): Payer: BC Managed Care – PPO | Admitting: Family Medicine

## 2019-09-19 VITALS — BP 134/90 | HR 100 | Temp 97.7°F | Ht 68.5 in | Wt 206.6 lb

## 2019-09-19 DIAGNOSIS — E7800 Pure hypercholesterolemia, unspecified: Secondary | ICD-10-CM

## 2019-09-19 DIAGNOSIS — I10 Essential (primary) hypertension: Secondary | ICD-10-CM | POA: Diagnosis not present

## 2019-09-19 DIAGNOSIS — K50919 Crohn's disease, unspecified, with unspecified complications: Secondary | ICD-10-CM | POA: Diagnosis not present

## 2019-09-19 DIAGNOSIS — Z6830 Body mass index (BMI) 30.0-30.9, adult: Secondary | ICD-10-CM

## 2019-09-19 DIAGNOSIS — R7303 Prediabetes: Secondary | ICD-10-CM

## 2019-09-19 DIAGNOSIS — F341 Dysthymic disorder: Secondary | ICD-10-CM

## 2019-09-19 DIAGNOSIS — Z0000 Encounter for general adult medical examination without abnormal findings: Secondary | ICD-10-CM | POA: Diagnosis not present

## 2019-09-19 DIAGNOSIS — R000 Tachycardia, unspecified: Secondary | ICD-10-CM

## 2019-09-19 DIAGNOSIS — E6609 Other obesity due to excess calories: Secondary | ICD-10-CM

## 2019-09-19 DIAGNOSIS — Z800 Family history of malignant neoplasm of digestive organs: Secondary | ICD-10-CM | POA: Insufficient documentation

## 2019-09-19 MED ORDER — SERTRALINE HCL 50 MG PO TABS: 50.0000 mg | ORAL_TABLET | Freq: Every day | ORAL | 3 refills | Status: DC

## 2019-09-19 MED ORDER — LOSARTAN POTASSIUM 100 MG PO TABS: 100.0000 mg | ORAL_TABLET | Freq: Every day | ORAL | 3 refills | Status: DC

## 2019-09-19 MED ORDER — SPIRONOLACTONE 25 MG PO TABS: ORAL_TABLET | ORAL | 3 refills | Status: DC

## 2019-09-19 NOTE — Assessment & Plan Note (Signed)
Reviewed health habits including diet and exercise and skin cancer prevention Reviewed appropriate screening tests for age  Also reviewed health mt list, fam hx and immunization status , as well as social and family history   Pt declines gyn exam today  Ref done to GI for colonoscopy  Enc strongly to get mammogram (she will schedule that herself)  AVS: as follows Get your mammogram- let us know if you have trouble scheduling   When you are ready to do your gyn visit/exam/pap let us know   We will call you to schedule GI f/u/ colonoscopy   The newest shingles vaccine is called shingrix - call your insurance to see if covered  We have it and so do pharmacies   Work on healthy diet and exercise

## 2019-09-19 NOTE — Assessment & Plan Note (Signed)
?   Of chron's dz  Due for 5 y colonoscopy for this and fam hx of colon cancer  Ref done

## 2019-09-19 NOTE — Assessment & Plan Note (Signed)
BP: 134/90  Not in good control Inc losartan from 50 to 100 mg  F/u 1 mo  Given copy of DASH eating plan and enc exercise and wt loss  She is on spironolactone-will need to re check K at f/u

## 2019-09-19 NOTE — Assessment & Plan Note (Signed)
Ref for 5y recall colonoscopy

## 2019-09-19 NOTE — Assessment & Plan Note (Signed)
Lab Results  Component Value Date   HGBA1C 5.9 09/10/2019   disc imp of low glycemic diet and wt loss to prevent DM2

## 2019-09-19 NOTE — Assessment & Plan Note (Signed)
Stable with current tx  No changes made Enc self care/exercise

## 2019-09-19 NOTE — Patient Instructions (Addendum)
Get your mammogram- let us know if you have trouble scheduling   When you are ready to do your gyn visit/exam/pap let us know   We will call you to schedule GI f/u/ colonoscopy   The newest shingles vaccine is called shingrix - call your insurance to see if covered  We have it and so do pharmacies   Work on healthy diet and exercise   Increase your losartan to 100 mg daily   Follow up in 1 month for HTN

## 2019-09-19 NOTE — Progress Notes (Signed)
Subjective:    Patient ID: Veronica [name], female    DOB: 1961/01/20, 58 y.o.   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 -it makes her anxious    Wt Readings from Last 3 Encounters:  09/19/19 206 lb 9 oz (93.7 kg)  07/09/19 208 lb (94.3 kg)  01/02/19 203 lb 6.4 oz (92.3 kg)  wt is down 2 lb since summer  30.95 kg/m   Taking fair care of herself  Not going anywhere during the pandemic  Exercises on and off/not a lot  She walks some   Her dog died last month- had cancer  That was hard on her    Mammogram 9/18 -she will schedule it  Self breast exam -no lumps  Mother had breast cancer   Pap 10/17 -negative  In 2016 had ascus with neg HPV She declines pap today  Menopausal - her last period over a year ago  occ hot flashes-they are decreasing    Tdap 1/13  Colonoscopy 8/15 -she is due /sent her a letter  [facility]  Her mother had colon cancer Pt has personal hx of chron's   Flu vaccine 08/21/19  Zoster status -interested in vaccine   bp is up today No cp or palpitations or headaches or edema  No side effects to medicines  BP Readings from Last 3 Encounters:  09/19/19 134/90  07/09/19 140/90  01/02/19 134/86    Taking losartan Has been running high lately  Was high at the dentist   Lab Results  Component Value Date   CREATININE 0.83 09/10/2019   BUN 13 09/10/2019   NA 139 09/10/2019   K 3.9 09/10/2019   CL 103 09/10/2019   CO2 24 09/10/2019   Lab Results  Component Value Date   ALT 41 (H) 09/10/2019   AST 24 09/10/2019   ALKPHOS 135 (H) 09/10/2019   BILITOT 0.3 09/10/2019  no etoh  occ tylenol for headaches  No abd pain   Lab Results  Component Value Date   WBC 7.6 09/10/2019   HGB 13.3 09/10/2019   HCT 40.9 09/10/2019   MCV 88.6 09/10/2019   PLT 385.0 09/10/2019    Lab Results  Component Value Date   TSH 4.49  09/10/2019     Hyperlipidemia Lab Results  Component Value Date   CHOL 141 09/10/2019   CHOL 174 06/29/2019   CHOL 248 (H) 12/25/2018   Lab Results  Component Value Date   HDL 41.20 09/10/2019   HDL 45 06/29/2019   HDL 49 12/25/2018   Lab Results  Component Value Date   LDLCALC 100 (H) 06/29/2019   LDLCALC 160 (H) 12/25/2018   LDLCALC 101 (H) 10/21/2016   Lab Results  Component Value Date   TRIG 242.0 (H) 09/10/2019   TRIG 147 06/29/2019   TRIG 193 (H) 12/25/2018   Lab Results  Component Value Date   CHOLHDL 3 09/10/2019   CHOLHDL 3.9 06/29/2019   CHOLHDL 5.1 (H) 12/25/2018   Lab Results  Component Value Date   LDLDIRECT 71.0 09/10/2019   LDLDIRECT 262.0 08/31/2018   LDLDIRECT 111.0 08/26/2017   On repatha LDL is 71  Working very well Doing her best to eat healthy   Prediabetes Lab Results  Component Value Date   HGBA1C 5.9 09/10/2019  this is stable  Sugar is her downfall   H/o tachycardia  Improved when not in the office  Pulse Readings from Last 3 Encounters:  09/19/19 100  07/09/19 94  01/02/19 95   Mood is overall stable  Anxiety/depression - (? If a bit worse during pandemic) zoloft  Klonopin    Patient Active Problem List   Diagnosis Date Noted  . Family history of colon cancer 09/19/2019  . Family history of cerebral aneurysm 09/01/2016  . Obesity 09/01/2016  . Prediabetes 08/31/2016  . Family history of early CAD 05/07/2015  . Palpitations 03/11/2015  . Encounter for routine gynecological examination 03/11/2015  . Exercise intolerance 03/11/2015  . Tachycardia 07/16/2014  . Hyperlipidemia 11/16/2011  . Other screening mammogram 11/16/2011  . Fibrocystic breast 11/16/2011  . Routine general medical examination at a health care facility 11/07/2011  . 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 Regional enteritis ([place]) 11/25/2008  . NONSPECIFIC ABNORMAL RESULTS LIVR FUNCTION STUDY 09/25/2008  . Essential  hypertension 08/22/2008  . OTHER ABNORMAL BLOOD CHEMISTRY 08/01/2008  . ANXIETY DEPRESSION 06/27/2008  . ACNE, MILD 06/27/2008  . EDEMA 06/27/2008   Past Medical History:  Diagnosis Date  . Acne   . Colonic mass 1/10  . Crohn's disease ([place])   . Depression   . Edema   . Hyperlipidemia   . Hypertension   . PMS (premenstrual syndrome)    Past Surgical History:  Procedure Laterality Date  . HEMICOLECTOMY  1/10   Social History   Tobacco Use  . Smoking status: Never Smoker  . Smokeless tobacco: Never Used  Substance Use Topics  . Alcohol use: No    Alcohol/week: 0.0 standard drinks  . Drug use: No   Family History  Problem Relation Age of Onset  . Colon cancer Mother   . Breast cancer Mother   . Alcohol abuse Brother   . Hypertension Paternal Grandfather   . Diabetes Paternal Grandfather   . Coronary artery disease Paternal Grandfather   . Coronary artery disease Maternal Grandfather   . Aneurysm Paternal Grandmother        cerebral  . Hypertension Paternal Grandmother   . Pulmonary embolism Father   . Aneurysm Father        cerebral  . Alcohol abuse Son   . Drug abuse Son   . Depression Other        fam hx   Allergies  Allergen Reactions  . Hydrochlorothiazide     Dizzy and felt faint  . Ace Inhibitors     cough  . Crestor [Rosuvastatin [place]     Memory   . Fluoxetine Hcl     REACTION: urticartia  . Tenormin [Atenolol]     fatigue  . Venlafaxine     REACTION: weight gain, makes sleepy  . Bupropion Hcl     REACTION: tinnitus previously but no other symptoms.  She tolerated it on second trial w/o return of tinnitus.     Current Outpatient Medications on File Prior to Visit  Medication Sig Dispense Refill  . clonazePAM (KLONOPIN) 0.5 MG tablet Take 1 tablet (0.5 mg total) by mouth daily as needed for anxiety. 30 tablet 0  . Evolocumab with Infusor ([place]) 420 MG/3.5ML SOCT Inject 420 mg into the skin every 30 (thirty) days. 3.5  mL 11   No current facility-administered medications on file prior to visit.     Review of Systems  Constitutional: Positive for fatigue. Negative for activity change, appetite change, fever and unexpected weight change.  HENT: Negative for congestion,  ear pain, rhinorrhea, sinus pressure and sore throat.   Eyes: Negative for pain, redness and visual disturbance.  Respiratory: Negative for cough, shortness of breath and wheezing.   Cardiovascular: Negative for chest pain and palpitations.  Gastrointestinal: Negative for abdominal pain, blood in stool, constipation and diarrhea.  Endocrine: Negative for polydipsia and polyuria.  Genitourinary: Negative for dysuria, frequency and urgency.  Musculoskeletal: Positive for arthralgias. Negative for back pain and myalgias.  Skin: Negative for pallor and rash.  Allergic/Immunologic: Negative for environmental allergies.  Neurological: Negative for dizziness, syncope and headaches.  Hematological: Negative for adenopathy. Does not bruise/bleed easily.  Psychiatric/Behavioral: Negative for decreased concentration and dysphoric mood. The patient is nervous/anxious.        Objective:   Physical Exam Constitutional:      General: She is not in acute distress.    Appearance: Normal appearance. She is well-developed. She is obese. She is not ill-appearing or diaphoretic.  HENT:     Head: Normocephalic and atraumatic.     Right Ear: Tympanic membrane, ear canal and external ear normal.     Left Ear: Tympanic membrane, ear canal and external ear normal.     Nose: Nose normal. No congestion.     Mouth/Throat:     Mouth: Mucous membranes are moist.     Pharynx: Oropharynx is clear. No posterior oropharyngeal erythema.  Eyes:     General: No scleral icterus.    Extraocular Movements: Extraocular movements intact.     Conjunctiva/sclera: Conjunctivae normal.     Pupils: Pupils are equal, round, and reactive to light.  Neck:     Musculoskeletal:  Normal range of motion and neck supple. No neck rigidity or muscular tenderness.     Thyroid: No thyromegaly.     Vascular: No carotid bruit or JVD.  Cardiovascular:     Rate and Rhythm: Normal rate and regular rhythm.     Pulses: Normal pulses.     Heart sounds: Normal heart sounds. No gallop.   Pulmonary:     Effort: Pulmonary effort is normal. No respiratory distress.     Breath sounds: Normal breath sounds. No wheezing.     Comments: Good air exch Chest:     Chest wall: No tenderness.  Abdominal:     General: Bowel sounds are normal. There is no distension or abdominal bruit.     Palpations: Abdomen is soft. There is no mass.     Tenderness: There is no abdominal tenderness.     Hernia: No hernia is present.  Genitourinary:    Comments: Breast exam: No mass, nodules, thickening, tenderness, bulging, retraction, inflamation, nipple discharge or skin changes noted.  No axillary or clavicular LA.     Musculoskeletal: Normal range of motion.        General: No tenderness.     Right lower leg: No edema.     Left lower leg: No edema.  Lymphadenopathy:     Cervical: No cervical adenopathy.  Skin:    General: Skin is warm and dry.     Coloration: Skin is not pale.     Findings: No erythema or rash.     Comments: Solar lentigines diffusely Few sks   Neurological:     Mental Status: She is alert. Mental status is at baseline.     Cranial Nerves: No cranial nerve deficit.     Motor: No abnormal muscle tone.     Coordination: Coordination normal.     Gait: Gait normal.  Deep Tendon Reflexes: Reflexes are normal and symmetric. Reflexes normal.  Psychiatric:        Mood and Affect: Mood normal.        Cognition and Memory: Cognition and memory normal.     Comments: Pleasant and talkative            Assessment & Plan:   Problem List Items Addressed This Visit      Cardiovascular and Mediastinum   Essential hypertension    BP: 134/90  Not in good control Inc losartan  from 50 to 100 mg  F/u 1 mo  Given copy of DASH eating plan and enc exercise and wt loss  She is on spironolactone-will need to re check K at f/u      Relevant Medications   losartan (COZAAR) 100 MG tablet   spironolactone (ALDACTONE) 25 MG tablet     Digestive   Regional enteritis [facility])    ? Of chron's dz  Due for 5 y colonoscopy for this and fam hx of colon cancer  Ref done      Relevant Orders   Ambulatory referral to Gastroenterology     Other   ANXIETY DEPRESSION    Stable with current tx  No changes made Enc self care/exercise      Relevant Medications   sertraline (ZOLOFT) 50 MG tablet   Routine general medical examination at a health care facility - Primary    Reviewed health habits including diet and exercise and skin cancer prevention Reviewed appropriate screening tests for age  Also reviewed health mt list, fam hx and immunization status , as well as social and family history   Pt declines gyn exam today  Ref done to GI for colonoscopy  Enc strongly to get mammogram (she will schedule that herself)  AVS: as follows Get your mammogram- let us know if you have trouble scheduling   When you are ready to do your gyn visit/exam/pap let us know   We will call you to schedule GI f/u/ colonoscopy   The newest shingles vaccine is called shingrix - call your insurance to see if covered  We have it and so do pharmacies   Work on healthy diet and exercise        Hyperlipidemia    Much improved with repatha Disc goals for lipids and reasons to control them Rev last labs with pt Rev low sat fat diet in detail  LDL is down to 71   (as high as 262 in the past)        Relevant Medications   losartan (COZAAR) 100 MG tablet   spironolactone (ALDACTONE) 25 MG tablet   Tachycardia   Prediabetes    Lab Results  Component Value Date   HGBA1C 5.9 09/10/2019   disc imp of low glycemic diet and wt loss to prevent DM2       Obesity    Discussed how this  problem influences overall health and the risks it imposes  Reviewed plan for weight loss with lower calorie diet (via better food choices and also portion control or program like weight watchers) and exercise building up to or more than 30 minutes 5 days per week including some aerobic activity         Family history of colon cancer    Ref for 5y recall colonoscopy      Relevant Orders   Ambulatory referral to Gastroenterology

## 2019-09-19 NOTE — Assessment & Plan Note (Signed)
Much improved with repatha Disc goals for lipids and reasons to control them Rev last labs with pt Rev low sat fat diet in detail  LDL is down to 71   (as high as 262 in the past)

## 2019-09-19 NOTE — Assessment & Plan Note (Signed)
Discussed how this problem influences overall health and the risks it imposes  Reviewed plan for weight loss with lower calorie diet (via better food choices and also portion control or program like weight watchers) and exercise building up to or more than 30 minutes 5 days per week including some aerobic activity    

## 2019-10-16 ENCOUNTER — Other Ambulatory Visit: Payer: Self-pay | Admitting: Cardiology

## 2019-10-24 ENCOUNTER — Ambulatory Visit: Payer: BC Managed Care – PPO | Admitting: Family Medicine

## 2019-10-26 ENCOUNTER — Encounter: Payer: Self-pay | Admitting: Family Medicine

## 2019-10-26 ENCOUNTER — Other Ambulatory Visit: Payer: Self-pay

## 2019-10-26 ENCOUNTER — Ambulatory Visit: Payer: BC Managed Care – PPO | Admitting: Family Medicine

## 2019-10-26 VITALS — BP 124/84 | HR 94 | Temp 97.4°F | Wt 208.0 lb

## 2019-10-26 DIAGNOSIS — Z6830 Body mass index (BMI) 30.0-30.9, adult: Secondary | ICD-10-CM

## 2019-10-26 DIAGNOSIS — I10 Essential (primary) hypertension: Secondary | ICD-10-CM | POA: Diagnosis not present

## 2019-10-26 DIAGNOSIS — E6609 Other obesity due to excess calories: Secondary | ICD-10-CM | POA: Diagnosis not present

## 2019-10-26 LAB — RENAL FUNCTION PANEL
Albumin: 4.2 g/dL (ref 3.5–5.2)
BUN: 13 mg/dL (ref 6–23)
CO2: 27 mEq/L (ref 19–32)
Calcium: 9.5 mg/dL (ref 8.4–10.5)
Chloride: 105 mEq/L (ref 96–112)
Creatinine, Ser: 0.92 mg/dL (ref 0.40–1.20)
GFR: 62.96 mL/min (ref >60.00)
Glucose, Bld: 107 mg/dL — ABNORMAL HIGH (ref 70–99)
Phosphorus: 2.4 mg/dL (ref 2.3–4.6)
Potassium: 4.3 mEq/L (ref 3.5–5.1)
Sodium: 141 mEq/L (ref 135–145)

## 2019-10-26 NOTE — Assessment & Plan Note (Signed)
Discussed how this problem influences overall health and the risks it imposes  Reviewed plan for weight loss with lower calorie diet (via better food choices and also portion control or program like weight watchers) and exercise building up to or more than 30 minutes 5 days per week including some aerobic activity    

## 2019-10-26 NOTE — Progress Notes (Signed)
Subjective:    Patient ID: Veronica [name], female    DOB: 1961-04-12, 58 y.o.   MRN: [id number]  This visit occurred during the SARS-CoV-2 public health emergency.  Safety protocols were in place, including screening questions prior to the visit, additional usage of staff PPE, and extensive cleaning of exam room while observing appropriate contact time as indicated for disinfecting solutions.    HPI Pt presents for f/u of HTN   Wt Readings from Last 3 Encounters:  10/26/19 208 lb (94.3 kg)  09/19/19 206 lb 9 oz (93.7 kg)  07/09/19 208 lb (94.3 kg)   31.17 kg/m   bp is stable today  No cp or palpitations or headaches or edema  No side effects to medicines  BP Readings from Last 3 Encounters:  10/26/19 124/84  09/19/19 134/90  07/09/19 140/90    Last visit we increased losartan from 50  To 100 mg daily  Tolerating it well  Needs renal panel Also taking spironolactone   Does not check bp at home often   Stress is improved temporarily - has 2 weeks off upcoming- excited about that    Patient Active Problem List   Diagnosis Date Noted  . Family history of colon cancer 09/19/2019  . Family history of cerebral aneurysm 09/01/2016  . Obesity 09/01/2016  . Prediabetes 08/31/2016  . Family history of early CAD 05/07/2015  . Palpitations 03/11/2015  . Encounter for routine gynecological examination 03/11/2015  . Exercise intolerance 03/11/2015  . Tachycardia 07/16/2014  . Hyperlipidemia 11/16/2011  . Other screening mammogram 11/16/2011  . Fibrocystic breast 11/16/2011  . Routine general medical examination at a health care facility 11/07/2011  . 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 Regional enteritis ([place]) 11/25/2008  . Essential hypertension 08/22/2008  . OTHER ABNORMAL BLOOD CHEMISTRY 08/01/2008  . ANXIETY DEPRESSION 06/27/2008  . ACNE, MILD 06/27/2008  . EDEMA 06/27/2008   Past Medical History:  Diagnosis Date  . Acne   . Colonic mass  1/10  . Crohn's disease ([place])   . Depression   . Edema   . Hyperlipidemia   . Hypertension   . PMS (premenstrual syndrome)    Past Surgical History:  Procedure Laterality Date  . HEMICOLECTOMY  1/10   Social History   Tobacco Use  . Smoking status: Never Smoker  . Smokeless tobacco: Never Used  Substance Use Topics  . Alcohol use: No    Alcohol/week: 0.0 standard drinks  . Drug use: No   Family History  Problem Relation Age of Onset  . Colon cancer Mother   . Breast cancer Mother   . Alcohol abuse Brother   . Hypertension Paternal Grandfather   . Diabetes Paternal Grandfather   . Coronary artery disease Paternal Grandfather   . Coronary artery disease Maternal Grandfather   . Aneurysm Paternal Grandmother        cerebral  . Hypertension Paternal Grandmother   . Pulmonary embolism Father   . Aneurysm Father        cerebral  . Alcohol abuse Son   . Drug abuse Son   . Depression Other        fam hx   Allergies  Allergen Reactions  . Hydrochlorothiazide     Dizzy and felt faint  . Ace Inhibitors     cough  . Crestor [Rosuvastatin [place]     Memory   . Fluoxetine Hcl     REACTION: urticartia  . Tenormin [Atenolol]  fatigue  . Venlafaxine     REACTION: weight gain, makes sleepy  . Bupropion Hcl     REACTION: tinnitus previously but no other symptoms.  She tolerated it on second trial w/o return of tinnitus.     Current Outpatient Medications on File Prior to Visit  Medication Sig Dispense Refill  . clonazePAM (KLONOPIN) 0.5 MG tablet Take 1 tablet (0.5 mg total) by mouth daily as needed for anxiety. 30 tablet 0  . losartan (COZAAR) 100 MG tablet Take 1 tablet (100 mg total) by mouth daily. 90 tablet 3  . [place] 420 MG/3.5ML SOCT INJECT 420MG INTO THE SKIN EVERY 30 DAYS 3.5 mL 11  . sertraline (ZOLOFT) 50 MG tablet Take 1 tablet (50 mg total) by mouth daily. 90 tablet 3  . spironolactone (ALDACTONE) 25 MG tablet TAKE 1/2 TABLETS BY  MOUTH DAILY. 45 tablet 3   No current facility-administered medications on file prior to visit.    Review of Systems  Constitutional: Negative for activity change, appetite change, fatigue, fever and unexpected weight change.  HENT: Negative for congestion, ear pain, rhinorrhea, sinus pressure and sore throat.   Eyes: Negative for pain, redness and visual disturbance.  Respiratory: Negative for cough, shortness of breath and wheezing.   Cardiovascular: Negative for chest pain and palpitations.  Gastrointestinal: Negative for abdominal pain, blood in stool, constipation and diarrhea.  Endocrine: Negative for polydipsia and polyuria.  Genitourinary: Negative for dysuria, frequency and urgency.  Musculoskeletal: Negative for arthralgias, back pain and myalgias.  Skin: Negative for pallor and rash.  Allergic/Immunologic: Negative for environmental allergies.  Neurological: Negative for dizziness, syncope and headaches.  Hematological: Negative for adenopathy. Does not bruise/bleed easily.  Psychiatric/Behavioral: Negative for decreased concentration and dysphoric mood. The patient is not nervous/anxious.        Stress is less       Objective:   Physical Exam Constitutional:      General: She is not in acute distress.    Appearance: Normal appearance. She is well-developed. She is obese. She is not ill-appearing or diaphoretic.  HENT:     Head: Normocephalic and atraumatic.  Eyes:     Conjunctiva/sclera: Conjunctivae normal.     Pupils: Pupils are equal, round, and reactive to light.  Neck:     Thyroid: No thyromegaly.     Vascular: No carotid bruit or JVD.  Cardiovascular:     Rate and Rhythm: Normal rate and regular rhythm.     Heart sounds: Normal heart sounds. No gallop.   Pulmonary:     Effort: Pulmonary effort is normal. No respiratory distress.     Breath sounds: Normal breath sounds. No wheezing or rales.  Abdominal:     General: Bowel sounds are normal. There is no  distension or abdominal bruit.     Palpations: Abdomen is soft. There is no mass.     Tenderness: There is no abdominal tenderness.  Musculoskeletal:     Cervical back: Normal range of motion and neck supple.  Lymphadenopathy:     Cervical: No cervical adenopathy.  Skin:    General: Skin is warm and dry.     Findings: No rash.  Neurological:     Mental Status: She is alert.     Cranial Nerves: No cranial nerve deficit.     Sensory: No sensory deficit.     Coordination: Coordination normal.     Deep Tendon Reflexes: Reflexes are normal and symmetric.  Psychiatric:  Mood and Affect: Mood normal.     Comments: Good mood           Assessment & Plan:   Problem List Items Addressed This Visit      Cardiovascular and Mediastinum   Essential hypertension - Primary    Improved bp with inc of losartan to 100 mg  Will continue this  Disc diet/exercise / DASH eating and self care Also continue spironolactone  Lab today (understand risk of inc K with the two medications)  Renal panel drawn      Relevant Orders   Renal function panel     Other   Obesity    Discussed how this problem influences overall health and the risks it imposes  Reviewed plan for weight loss with lower calorie diet (via better food choices and also portion control or program like weight watchers) and exercise building up to or more than 30 minutes 5 days per week including some aerobic activity

## 2019-10-26 NOTE — Patient Instructions (Addendum)
Take care of yourself   Work on healthy diet and exercise   Continue current medicines  Blood pressure is improved   Labs today

## 2019-10-26 NOTE — Assessment & Plan Note (Signed)
Improved bp with inc of losartan to 100 mg  Will continue this  Disc diet/exercise / DASH eating and self care Also continue spironolactone  Lab today (understand risk of inc K with the two medications)  Renal panel drawn

## 2019-12-07 DIAGNOSIS — Z9049 Acquired absence of other specified parts of digestive tract: Secondary | ICD-10-CM | POA: Diagnosis not present

## 2019-12-07 DIAGNOSIS — Z8719 Personal history of other diseases of the digestive system: Secondary | ICD-10-CM | POA: Diagnosis not present

## 2019-12-07 DIAGNOSIS — Z800 Family history of malignant neoplasm of digestive organs: Secondary | ICD-10-CM | POA: Diagnosis not present

## 2019-12-14 DIAGNOSIS — Z1159 Encounter for screening for other viral diseases: Secondary | ICD-10-CM | POA: Diagnosis not present

## 2019-12-17 DIAGNOSIS — E7800 Pure hypercholesterolemia, unspecified: Secondary | ICD-10-CM | POA: Diagnosis not present

## 2019-12-17 LAB — LIPID PANEL
Chol/HDL Ratio: 3.9 ratio (ref 0.0–4.4)
Cholesterol, Total: 180 mg/dL (ref 100–199)
HDL: 46 mg/dL (ref >39)
LDL Chol Calc (NIH): 100 mg/dL — ABNORMAL HIGH (ref 0–99)
Triglycerides: 199 mg/dL — ABNORMAL HIGH (ref 0–149)
VLDL Cholesterol Cal: 34 mg/dL (ref 5–40)

## 2019-12-19 DIAGNOSIS — Z8719 Personal history of other diseases of the digestive system: Secondary | ICD-10-CM | POA: Diagnosis not present

## 2019-12-19 DIAGNOSIS — K635 Polyp of colon: Secondary | ICD-10-CM | POA: Diagnosis not present

## 2019-12-19 DIAGNOSIS — K5730 Diverticulosis of large intestine without perforation or abscess without bleeding: Secondary | ICD-10-CM | POA: Diagnosis not present

## 2019-12-19 DIAGNOSIS — Z980 Intestinal bypass and anastomosis status: Secondary | ICD-10-CM | POA: Diagnosis not present

## 2019-12-19 DIAGNOSIS — Z800 Family history of malignant neoplasm of digestive organs: Secondary | ICD-10-CM | POA: Diagnosis not present

## 2019-12-21 ENCOUNTER — Other Ambulatory Visit: Payer: Self-pay

## 2019-12-21 ENCOUNTER — Encounter: Payer: Self-pay | Admitting: Cardiology

## 2019-12-21 ENCOUNTER — Ambulatory Visit: Payer: BC Managed Care – PPO | Admitting: Cardiology

## 2019-12-21 VITALS — BP 139/85 | HR 90 | Ht 68.5 in | Wt 209.6 lb

## 2019-12-21 DIAGNOSIS — I10 Essential (primary) hypertension: Secondary | ICD-10-CM

## 2019-12-21 DIAGNOSIS — Z6831 Body mass index (BMI) 31.0-31.9, adult: Secondary | ICD-10-CM

## 2019-12-21 DIAGNOSIS — Z8249 Family history of ischemic heart disease and other diseases of the circulatory system: Secondary | ICD-10-CM | POA: Diagnosis not present

## 2019-12-21 DIAGNOSIS — Z79899 Other long term (current) drug therapy: Secondary | ICD-10-CM

## 2019-12-21 DIAGNOSIS — E6609 Other obesity due to excess calories: Secondary | ICD-10-CM

## 2019-12-21 DIAGNOSIS — E782 Mixed hyperlipidemia: Secondary | ICD-10-CM

## 2019-12-21 DIAGNOSIS — Z7182 Exercise counseling: Secondary | ICD-10-CM

## 2019-12-21 DIAGNOSIS — Z713 Dietary counseling and surveillance: Secondary | ICD-10-CM

## 2019-12-21 NOTE — Patient Instructions (Signed)
Medication Instructions:  Your Physician recommend you continue on your current medication as directed.    *If you need a refill on your cardiac medications before your next appointment, please call your pharmacy*  Lab Work: Your physician recommends that you return for lab work prior to 6 month follow up appointment ( Fasting Lipid)   If you have labs (blood work) drawn today and your tests are completely normal, you will receive your results only by: Marland Kitchen MyChart Message (if you have MyChart) OR . A paper copy in the mail If you have any lab test that is abnormal or we need to change your treatment, we will call you to review the results.  Testing/Procedures: None  Follow-Up: At [facility], you and your health needs are our priority.  As part of our continuing mission to provide you with exceptional heart care, we have created designated Provider Care Teams.  These Care Teams include your primary Cardiologist (physician) and Advanced Practice Providers (APPs -  Physician Assistants and Nurse Practitioners) who all work together to provide you with the care you need, when you need it.  Your next appointment:   6 month(s)  The format for your next appointment:   In Person  Provider:   Buford Dresser, MD

## 2019-12-21 NOTE — Progress Notes (Signed)
Cardiology Office Note:    Date:  12/21/2019   ID:  Veronica [name], DOB Apr 17, 1961, MRN [id number]  PCP:  Abner Greenspan, MD  Cardiologist:  Buford Dresser, MD PhD  Referring MD: Abner Greenspan, MD   CC: follow up  History of Present Illness:    Veronica [name] is a 59 y.o. female with a hx of hyperlipidemia, hypertension who is seen in follow up. She was initially seen 10/02/18 as a new consult at the request of [name], Veronica Fanny, MD for the evaluation and management of family history of early CAD with hyperlipidemia. Last LDL was 262.  Cardiac history: see family history below. No personal history of xanthoma. Tried statin in the past (can't remember which one, per chart likely rosuvastatin), had severe memory issues with it. Had fatigue on beta blocker. Has anxiety. Has done fine with anesthesia in the past for procedures. Had a stress test in 2016 (echo stress). No ischemia on imaging. No recent syncope (vagal during teenage years).   Today: Recently had losartan increased for improved blood pressure control. We reviewed her recent lipid panel, see below. LD down from her peak of 262, but up from her nadir of 71.  Has been mostly working from home in recent months, less active, weight has gone up.   We reviewed diet recommendations, activity, etc today. Reviewed her lipid history. Discussed options for management.  Denies chest pain, shortness of breath at rest or with normal exertion. No PND, orthopnea, LE edema. No syncope. Rare palpitations, fleeting.   Past Medical History:  Diagnosis Date  . Acne   . Colonic mass 1/10  . Crohn's disease ([place])   . Depression   . Edema   . Hyperlipidemia   . Hypertension   . PMS (premenstrual syndrome)     Past Surgical History:  Procedure Laterality Date  . HEMICOLECTOMY  1/10    Current Medications: Current Outpatient Medications on File Prior to Visit  Medication Sig  . clonazePAM (KLONOPIN) 0.5 MG tablet Take 1  tablet (0.5 mg total) by mouth daily as needed for anxiety.  Marland Kitchen losartan (COZAAR) 100 MG tablet Take 1 tablet (100 mg total) by mouth daily.  Marland Kitchen [place] 420 MG/3.5ML SOCT INJECT 420MG INTO THE SKIN EVERY 30 DAYS  . sertraline (ZOLOFT) 50 MG tablet Take 1 tablet (50 mg total) by mouth daily.  Marland Kitchen spironolactone (ALDACTONE) 25 MG tablet TAKE 1/2 TABLETS BY MOUTH DAILY.   No current facility-administered medications on file prior to visit.     Allergies:   Hydrochlorothiazide, Ace inhibitors, Crestor [rosuvastatin calcium], Fluoxetine hcl, Tenormin [atenolol], Venlafaxine, and Bupropion hcl   Social History   Tobacco Use  . Smoking status: Never Smoker  . Smokeless tobacco: Never Used  Substance Use Topics  . Alcohol use: No    Alcohol/week: 0.0 standard drinks  . Drug use: No     Family History: The patient's family history includes Alcohol abuse in her brother and son; Aneurysm in her father and paternal grandmother; Breast cancer in her mother; Colon cancer in her mother; Coronary artery disease in her maternal grandfather and paternal grandfather; Depression in an other family member; Diabetes in her paternal grandfather; Drug abuse in her son; Hypertension in her paternal grandfather and paternal grandmother; Pulmonary embolism in her father.   Father: Cholesterol was over 400. Died age 83, ischemic heart disease. Had history of plaque in his legs causing clots. Unclear cause of death, either stroke or aneurysm.  Mother: High cholesterol, total 258. On metoprolol only. On no meds for cholesterol [name]: died age 30 of MI [name]: died about age 78, had prior brain aneurysm 11 years prior to her death, died of stroke [name]: polymyalgia and giant cell arteritis, died age 27 of intestinal issues [name]: died of MI at age 70. Brother: 5 years Sporer, unknown medical history other than alcoholism Pat siblings: 31 sisters, 3 brothers, many with high cholesterol, stents  including carotid and PAD, many with ischemic heart disease  Has two children, son age 55 and daughter age 67. No known health issues  ROS:   Please see the history of present illness.  Additional pertinent ROS negative except as noted in HPI.  EKGs/Labs/Other Studies Reviewed:    The following studies were reviewed today: CT coronary 11/24/18 1. Coronary calcium score of 0. This was 0 percentile for age and sex matched control. 2. Normal coronary origin with right dominance. 3. No evidence of CAD.  Stress echo 05/15/15 - Stress ECG conclusions: 1 mm ST segment depressin in lateral   leads during recovery Non diagnostic due to baseline changes.   There were no stress arrhythmias or conduction abnormalities. The   stress ECG was negative for ischemia. - Staged echo: There was no echocardiographic evidence for   stress-induced ischemia. - Impressions: Low risk study Normal resting and stress echo   images. ECG with baseline changes and abnormal ST depression in   lateral leads in recovery.  Impressions: - Low risk study Normal resting and stress echo images. ECG with   baseline changes and abnormal ST depression in lateral leads in   recovery.   EKG:  EKG is personally reviewed.  The ekg ordered 10/02/18 demonstrates normal sinus rhythm with nonspecific T wave changes  Recent Labs: 09/10/2019: ALT 41; Hemoglobin 13.3; Platelets 385.0; TSH 4.49 10/26/2019: BUN 13; Creatinine, Ser 0.92; Potassium 4.3; Sodium 141  Recent Lipid Panel    Component Value Date/Time   CHOL 180 12/17/2019 0941   TRIG 199 (H) 12/17/2019 0941   HDL 46 12/17/2019 0941   CHOLHDL 3.9 12/17/2019 0941   CHOLHDL 3 09/10/2019 1010   VLDL 48.4 (H) 09/10/2019 1010   LDLCALC 100 (H) 12/17/2019 0941   LDLDIRECT 71.0 09/10/2019 1010    Physical Exam:    VS:  BP 139/85   Pulse 90   Ht 5' 8.5" (1.74 m)   Wt 209 lb 9.6 oz (95.1 kg)   SpO2 96%   BMI 31.41 kg/m    No data found.  Wt Readings from  Last 3 Encounters:  12/21/19 209 lb 9.6 oz (95.1 kg)  10/26/19 208 lb (94.3 kg)  09/19/19 206 lb 9 oz (93.7 kg)    GEN: Well nourished, well developed in no acute distress HEENT: Normal, moist mucous membranes NECK: No JVD CARDIAC: regular rhythm, normal S1 and S2, no rubs or gallops. No murmur. VASCULAR: Radial and DP pulses 2+ bilaterally. No carotid bruits RESPIRATORY:  Clear to auscultation without rales, wheezing or rhonchi  ABDOMEN: Soft, non-tender, non-distended MUSCULOSKELETAL:  Ambulates independently SKIN: Warm and dry, no edema NEUROLOGIC:  Alert and oriented x 3. No focal neuro deficits noted. PSYCHIATRIC:  Normal affect   ASSESSMENT:    1. Medication management   2. Mixed hyperlipidemia   3. Essential hypertension   4. Family history of heart disease   5. Exercise counseling   6. Nutritional counseling   7. Class 1 obesity due to excess calories without  serious comorbidity with body mass index (BMI) of 31.0 to 31.9 in adult    PLAN:    Mixed hyperlipidemia, with hypercholesterolemia (severe) and hypertriglyceridemia: Likely heterozygous familial hypercholesterolemia -children should be screened with lipid panel, and discussed option of genetic testing. She has discussed this with her children -coronary CTA without evidence of established CAD -initiated PCSK9 inhibitor 09/2018, prior direct LDL 264 not on statin -has not tolerated statin in the past due to memory issues.  -counseled on both nutrition and exercise in addition to PCSK9 inhibitor. She will try to get back on track with this -her LDL is up slightly on most recent check. Vastly improved from prior, but would like to see better numbers. We discussed lifestyle management vs. Adding non statin agent. She will work on diet and lifestyle. Discussed omega-3 fatty acids today in diet as well as prescription omega 3 meds.  Hypertension: goal <130/80. Above goal today -recently had losartan increased. Given this,  will monitor for now -continue losartan and spironolactone as ordered -diet and exercise, as noted  Prevention and health promotion counseling: family history of cardiovascular disease/high cholesterol as additional risk factor -recommend heart healthy/Mediterranean diet, with whole grains, fruits, vegetable, fish, lean meats, nuts, and olive oil. Limit salt. -recommend moderate walking, 3-5 times/week for 30-50 minutes each session. Aim for at least 150 minutes.week. Goal should be pace of 3 miles/hours, or walking 1.5 miles in 30 minutes -recommend avoidance of tobacco products. Avoid excess alcohol. -Weight: BMI 31, class 1 obesity. Discussed weight loss, diet, and exercise today  Plan for follow up: 6 mos, recheck fasting lipids prior  Medication Adjustments/Labs and Tests Ordered: Current medicines are reviewed at length with the patient today.  Concerns regarding medicines are outlined above.  Orders Placed This Encounter  Procedures  . Lipid panel   No orders of the defined types were placed in this encounter.   Patient Instructions  Medication Instructions:  Your Physician recommend you continue on your current medication as directed.    *If you need a refill on your cardiac medications before your next appointment, please call your pharmacy*  Lab Work: Your physician recommends that you return for lab work prior to 6 month follow up appointment ( Fasting Lipid)   If you have labs (blood work) drawn today and your tests are completely normal, you will receive your results only by: Marland Kitchen MyChart Message (if you have MyChart) OR . A paper copy in the mail If you have any lab test that is abnormal or we need to change your treatment, we will call you to review the results.  Testing/Procedures: None  Follow-Up: At [facility], you and your health needs are our priority.  As part of our continuing mission to provide you with exceptional heart care, we have created designated  Provider Care Teams.  These Care Teams include your primary Cardiologist (physician) and Advanced Practice Providers (APPs -  Physician Assistants and Nurse Practitioners) who all work together to provide you with the care you need, when you need it.  Your next appointment:   6 month(s)  The format for your next appointment:   In Person  Provider:   Buford Dresser, MD      Signed, Buford Dresser, MD PhD 12/21/2019  [place]

## 2020-01-05 ENCOUNTER — Ambulatory Visit
Admission: RE | Admit: 2020-01-05 | Discharge status: Absent without leave | Payer: BC Managed Care – PPO | Source: Ambulatory Visit

## 2020-01-07 ENCOUNTER — Ambulatory Visit: Payer: BC Managed Care – PPO | Attending: Internal Medicine

## 2020-01-07 DIAGNOSIS — Z20822 Contact with and (suspected) exposure to covid-19: Secondary | ICD-10-CM | POA: Diagnosis not present

## 2020-01-08 LAB — NOVEL CORONAVIRUS, NAA: SARS-CoV-2, NAA: DETECTED — AB

## 2020-01-09 ENCOUNTER — Encounter: Payer: Self-pay | Admitting: Family Medicine

## 2020-01-09 ENCOUNTER — Ambulatory Visit (HOSPITAL_COMMUNITY)
Admission: RE | Admit: 2020-01-09 | Discharge: 2020-01-09 | Disposition: A | Discharge status: Home / Self Care | Payer: BC Managed Care – PPO | Source: Ambulatory Visit | Attending: Pulmonary Disease | Admitting: Pulmonary Disease

## 2020-01-09 ENCOUNTER — Other Ambulatory Visit: Payer: Self-pay | Admitting: Internal Medicine

## 2020-01-09 DIAGNOSIS — U071 COVID-19: Secondary | ICD-10-CM | POA: Insufficient documentation

## 2020-01-09 DIAGNOSIS — I10 Essential (primary) hypertension: Secondary | ICD-10-CM

## 2020-01-09 MED ORDER — SODIUM CHLORIDE 0.9 % IV SOLN
700.0000 mg | Freq: Once | INTRAVENOUS | Status: AC
  Administered 2020-01-09: 700 mg via INTRAVENOUS
  Filled 2020-01-09: qty 20

## 2020-01-09 MED ORDER — FAMOTIDINE IN NACL 20-0.9 MG/50ML-% IV SOLN: 20.0000 mg | Freq: Once | INTRAVENOUS | Status: DC | PRN

## 2020-01-09 MED ORDER — EPINEPHRINE 0.3 MG/0.3ML IJ SOAJ: 0.3000 mg | Freq: Once | INTRAMUSCULAR | Status: DC | PRN

## 2020-01-09 MED ORDER — SODIUM CHLORIDE 0.9 % IV SOLN: INTRAVENOUS | Status: DC | PRN

## 2020-01-09 MED ORDER — ONDANSETRON HCL 4 MG/2ML IJ SOLN
4.0000 mg | Freq: Once | INTRAMUSCULAR | Status: AC
  Administered 2020-01-09: 4 mg via INTRAVENOUS
  Filled 2020-01-09: qty 2

## 2020-01-09 MED ORDER — ALBUTEROL SULFATE HFA 108 (90 BASE) MCG/ACT IN AERS: 2.0000 | INHALATION_SPRAY | Freq: Once | RESPIRATORY_TRACT | Status: DC | PRN

## 2020-01-09 MED ORDER — METHYLPREDNISOLONE SODIUM SUCC 125 MG IJ SOLR: 125.0000 mg | Freq: Once | INTRAMUSCULAR | Status: DC | PRN

## 2020-01-09 MED ORDER — DIPHENHYDRAMINE HCL 50 MG/ML IJ SOLN: 50.0000 mg | Freq: Once | INTRAMUSCULAR | Status: DC | PRN

## 2020-01-09 NOTE — Discharge Instructions (Signed)

## 2020-01-09 NOTE — Progress Notes (Signed)
  Diagnosis: COVID-19  Physician: Dr. Joya Gaskins  Procedure: [facility] Med: bamlanivimab infusion - Provided patient with bamlanimivab fact sheet for patients, parents and caregivers prior to infusion.  Complications: No immediate complications noted.  Discharge: Discharged home   Babs Sciara 01/09/2020

## 2020-01-09 NOTE — Progress Notes (Signed)
  I connected by phone with Veronica [name] on 01/09/2020 at 8:44 AM to discuss the potential use of an new treatment for mild to moderate COVID-19 viral infection in non-hospitalized patients.  This patient is a 60 y.o. female that meets the FDA criteria for Emergency Use Authorization of bamlanivimab or casirivimab\imdevimab.  Has a (+) direct SARS-CoV-2 viral test result  Has mild or moderate COVID-19   Is ? 57 years of age and weighs ? 40 kg  Is NOT hospitalized due to COVID-19  Is NOT requiring oxygen therapy or requiring an increase in baseline oxygen flow rate due to COVID-19  Is within 10 days of symptom onset  Has at least one of the high risk factor(s) for progression to severe COVID-19 and/or hospitalization as defined in EUA.  Specific high risk criteria : Hypertension   I have spoken and communicated the following to the patient or parent/caregiver:  1. FDA has authorized the emergency use of bamlanivimab and casirivimab\imdevimab for the treatment of mild to moderate COVID-19 in adults and pediatric patients with positive results of direct SARS-CoV-2 viral testing who are 65 years of age and older weighing at least 40 kg, and who are at high risk for progressing to severe COVID-19 and/or hospitalization.  2. The significant known and potential risks and benefits of bamlanivimab and casirivimab\imdevimab, and the extent to which such potential risks and benefits are unknown.  3. Information on available alternative treatments and the risks and benefits of those alternatives, including clinical trials.  4. Patients treated with bamlanivimab and casirivimab\imdevimab should continue to self-isolate and use infection control measures (e.g., wear mask, isolate, social distance, avoid sharing personal items, clean and disinfect "high touch" surfaces, and frequent handwashing) according to CDC guidelines.   5. The patient or parent/caregiver has the option to accept or refuse  bamlanivimab or casirivimab\imdevimab .  After reviewing this information with the patient, The patient agreed to proceed with receiving the bamlanimivab infusion and will be provided a copy of the Fact sheet prior to receiving the infusion.   Infusion scheduled for 3/3 at 1430. Day 5 of symptoms.  Alan Ripper, NP-C [place]

## 2020-01-21 ENCOUNTER — Ambulatory Visit: Payer: BC Managed Care – PPO | Attending: Internal Medicine

## 2020-01-21 DIAGNOSIS — Z20822 Contact with and (suspected) exposure to covid-19: Secondary | ICD-10-CM | POA: Diagnosis not present

## 2020-01-22 LAB — NOVEL CORONAVIRUS, NAA: SARS-CoV-2, NAA: NOT DETECTED

## 2020-01-23 ENCOUNTER — Encounter: Payer: Self-pay | Admitting: Family Medicine

## 2020-07-02 DIAGNOSIS — Z79899 Other long term (current) drug therapy: Secondary | ICD-10-CM | POA: Diagnosis not present

## 2020-07-02 LAB — LIPID PANEL
Chol/HDL Ratio: 3.9 ratio (ref 0.0–4.4)
Cholesterol, Total: 189 mg/dL (ref 100–199)
HDL: 48 mg/dL (ref >39)
LDL Chol Calc (NIH): 103 mg/dL — ABNORMAL HIGH (ref 0–99)
Triglycerides: 224 mg/dL — ABNORMAL HIGH (ref 0–149)
VLDL Cholesterol Cal: 38 mg/dL (ref 5–40)

## 2020-07-10 ENCOUNTER — Encounter: Payer: Self-pay | Admitting: Cardiology

## 2020-07-10 ENCOUNTER — Other Ambulatory Visit: Payer: Self-pay

## 2020-07-10 ENCOUNTER — Ambulatory Visit: Payer: BC Managed Care – PPO | Admitting: Cardiology

## 2020-07-10 ENCOUNTER — Telehealth: Payer: Self-pay | Admitting: Radiology

## 2020-07-10 VITALS — BP 170/110 | HR 82 | Ht 68.5 in | Wt 214.4 lb

## 2020-07-10 DIAGNOSIS — R002 Palpitations: Secondary | ICD-10-CM

## 2020-07-10 DIAGNOSIS — E782 Mixed hyperlipidemia: Secondary | ICD-10-CM | POA: Diagnosis not present

## 2020-07-10 DIAGNOSIS — I10 Essential (primary) hypertension: Secondary | ICD-10-CM

## 2020-07-10 DIAGNOSIS — Z8249 Family history of ischemic heart disease and other diseases of the circulatory system: Secondary | ICD-10-CM | POA: Diagnosis not present

## 2020-07-10 DIAGNOSIS — Z7189 Other specified counseling: Secondary | ICD-10-CM

## 2020-07-10 DIAGNOSIS — Z713 Dietary counseling and surveillance: Secondary | ICD-10-CM

## 2020-07-10 DIAGNOSIS — Z7182 Exercise counseling: Secondary | ICD-10-CM

## 2020-07-10 MED ORDER — AMLODIPINE BESYLATE 5 MG PO TABS: 5.0000 mg | ORAL_TABLET | Freq: Every day | ORAL | 3 refills | Status: DC

## 2020-07-10 NOTE — Telephone Encounter (Signed)
Enrolled patient for a 14 day Zio XT  monitor to be mailed to patients home  °

## 2020-07-10 NOTE — Progress Notes (Signed)
Cardiology Office Note:    Date:  07/10/2020   ID:  Eugenio Hoes [place], DOB [birth year], MRN [id number]  PCP:  Abner Greenspan, MD  Cardiologist:  Buford Dresser, MD PhD  Referring MD: Abner Greenspan, MD   CC: follow up  History of Present Illness:    Veronica [name] is a 59 y.o. female with a hx of hyperlipidemia, hypertension who is seen in follow up. She was initially seen 10/02/18 as a new consult at the request of [name], [name], MD for the evaluation and management of family history of early CAD with hyperlipidemia. Last LDL was 262.  Cardiac history: see family history below. No personal history of xanthoma. Tried statin in the past (can't remember which one, per chart likely rosuvastatin), had severe memory issues with it. Had fatigue on beta blocker. Has anxiety. Has done fine with anesthesia in the past for procedures. Had a stress test in 2016 (echo stress). No ischemia on imaging. No recent syncope (vagal during teenage years).   Today: Had Covid in March, not hospitalized but received monoclonal antibodies. Has struggled with weight, depression as well. Blood pressure has also been more difficult to control. Stressful job, boss just resigned.   Having more frequent and more bothersome palpitations. Every day. Keeps her from pushing herself with exercise.  Hasn't been checking blood pressures routinely at home. One reading was 159/94 last week.   Denies chest pain, shortness of breath at rest or with normal exertion. No PND, orthopnea, LE edema or unexpected weight gain. No syncope.  Past Medical History:  Diagnosis Date  . Acne   . Colonic mass 1/10  . Crohn's disease ([place])   . Depression   . Edema   . Hyperlipidemia   . Hypertension   . PMS (premenstrual syndrome)     Past Surgical History:  Procedure Laterality Date  . HEMICOLECTOMY  1/10    Current Medications: Current Outpatient Medications on File Prior to Visit  Medication Sig  . clonazePAM  (KLONOPIN) 0.5 MG tablet Take 1 tablet (0.5 mg total) by mouth daily as needed for anxiety.  Marland Kitchen losartan (COZAAR) 100 MG tablet Take 1 tablet (100 mg total) by mouth daily.  Marland Kitchen [place] 420 MG/3.5ML SOCT INJECT 420MG INTO THE SKIN EVERY 30 DAYS  . sertraline (ZOLOFT) 50 MG tablet Take 1 tablet (50 mg total) by mouth daily.  Marland Kitchen spironolactone (ALDACTONE) 25 MG tablet TAKE 1/2 TABLETS BY MOUTH DAILY.   No current facility-administered medications on file prior to visit.     Allergies:   Hydrochlorothiazide, Ace inhibitors, Crestor [rosuvastatin calcium], Fluoxetine hcl, Tenormin [atenolol], Venlafaxine, and Bupropion hcl   Social History   Tobacco Use  . Smoking status: Never Smoker  . Smokeless tobacco: Never Used  Substance Use Topics  . Alcohol use: No    Alcohol/week: 0.0 standard drinks  . Drug use: No     Family History: The patient's family history includes Alcohol abuse in her brother and son; Aneurysm in her father and paternal grandmother; Breast cancer in her mother; Colon cancer in her mother; Coronary artery disease in her maternal grandfather and paternal grandfather; Depression in an other family member; Diabetes in her paternal grandfather; Drug abuse in her son; Hypertension in her paternal grandfather and paternal grandmother; Pulmonary embolism in her father.   Father: Cholesterol was over 400. Died age 51, ischemic heart disease. Had history of plaque in his legs causing clots. Unclear cause of death, either stroke or  aneurysm. Mother: High cholesterol, total 258. On metoprolol only. On no meds for cholesterol [name]: died age 6 of MI [name]: died about age 65, had prior brain aneurysm 11 years prior to her death, died of stroke [name]: polymyalgia and giant cell arteritis, died age 8 of intestinal issues [name]: died of MI at age 15. Brother: 5 years Akey, unknown medical history other than alcoholism Pat siblings: 63 sisters, 3 brothers, many  with high cholesterol, stents including carotid and PAD, many with ischemic heart disease  Has two children, son age 40 and daughter age 58. No known health issues  ROS:   Please see the history of present illness.  Additional pertinent ROS negative except as noted in HPI.  EKGs/Labs/Other Studies Reviewed:    The following studies were reviewed today: CT coronary 11/24/18 1. Coronary calcium score of 0. This was 0 percentile for age and sex matched control. 2. Normal coronary origin with right dominance. 3. No evidence of CAD.  Stress echo 05/15/15 - Stress ECG conclusions: 1 mm ST segment depressin in lateral   leads during recovery Non diagnostic due to baseline changes.   There were no stress arrhythmias or conduction abnormalities. The   stress ECG was negative for ischemia. - Staged echo: There was no echocardiographic evidence for   stress-induced ischemia. - Impressions: Low risk study Normal resting and stress echo   images. ECG with baseline changes and abnormal ST depression in   lateral leads in recovery.  Impressions: - Low risk study Normal resting and stress echo images. ECG with   baseline changes and abnormal ST depression in lateral leads in   recovery.   EKG:  EKG is personally reviewed.  The ekg ordered today demonstrates normal sinus rhythm with nonspecific T wave changes, HR 82 bpm  Recent Labs: 09/10/2019: ALT 41; Hemoglobin 13.3; Platelets 385.0; TSH 4.49 10/26/2019: BUN 13; Creatinine, Ser 0.92; Potassium 4.3; Sodium 141  Recent Lipid Panel    Component Value Date/Time   CHOL 189 07/02/2020 0847   TRIG 224 (H) 07/02/2020 0847   HDL 48 07/02/2020 0847   CHOLHDL 3.9 07/02/2020 0847   CHOLHDL 3 09/10/2019 1010   VLDL 48.4 (H) 09/10/2019 1010   LDLCALC 103 (H) 07/02/2020 0847   LDLDIRECT 71.0 09/10/2019 1010    Physical Exam:    VS:  BP (!) 170/110 (BP Location: Left Arm, Patient Position: Sitting, Cuff Size: Normal)   Pulse 82   Ht 5' 8.5"  (1.74 m)   Wt 214 lb 6.4 oz (97.3 kg)   BMI 32.13 kg/m    No data found.  Wt Readings from Last 3 Encounters:  07/10/20 214 lb 6.4 oz (97.3 kg)  12/21/19 209 lb 9.6 oz (95.1 kg)  10/26/19 208 lb (94.3 kg)    GEN: Well nourished, well developed in no acute distress HEENT: Normal, moist mucous membranes NECK: No JVD CARDIAC: regular rhythm, normal S1 and S2, no rubs or gallops. No murmur. VASCULAR: Radial and DP pulses 2+ bilaterally. No carotid bruits RESPIRATORY:  Clear to auscultation without rales, wheezing or rhonchi  ABDOMEN: Soft, non-tender, non-distended MUSCULOSKELETAL:  Ambulates independently SKIN: Warm and dry, no edema NEUROLOGIC:  Alert and oriented x 3. No focal neuro deficits noted. PSYCHIATRIC:  Normal affect   ASSESSMENT:    1. Palpitations   2. Essential hypertension   3. Mixed hyperlipidemia   4. Family history of heart disease   5. Exercise counseling   6. Nutritional counseling  7. Counseling on health promotion and disease prevention    PLAN:    Palpitations:  -becoming more frequent and bothersome -will get Zio monitor to evaluate for arrhythmia  Mixed hyperlipidemia, with hypercholesterolemia (severe) and hypertriglyceridemia: Likely heterozygous familial hypercholesterolemia -children should be screened with lipid panel, and discussed option of genetic testing. She has discussed this with her children -coronary CTA without evidence of established CAD -initiated PCSK9 inhibitor 09/2018, prior direct LDL 264 not on statin -has not tolerated statin in the past due to memory issues.  -counseled on both nutrition and exercise in addition to PCSK9 inhibitor.  -working on diet, lifestyle but in a stress filled part of her life right now  Hypertension: goal <130/80. Significantly above goal today -will add amlodipine today -continue losartan and spironolactone as ordered -diet and exercise, as above  Prevention and health promotion counseling:  family history of cardiovascular disease/high cholesterol as additional risk factor -recommend heart healthy/Mediterranean diet, with whole grains, fruits, vegetable, fish, lean meats, nuts, and olive oil. Limit salt. -recommend moderate walking, 3-5 times/week for 30-50 minutes each session. Aim for at least 150 minutes.week. Goal should be pace of 3 miles/hours, or walking 1.5 miles in 30 minutes -recommend avoidance of tobacco products. Avoid excess alcohol. -Weight: BMI 32, class 1 obesity. Discussed weight loss, diet, and exercise today  Plan for follow up: 4-6 weeks  Medication Adjustments/Labs and Tests Ordered: Current medicines are reviewed at length with the patient today.  Concerns regarding medicines are outlined above.  Orders Placed This Encounter  Procedures  . LONG TERM MONITOR (3-14 DAYS)  . EKG 12-Lead   Meds ordered this encounter  Medications  . amLODipine (NORVASC) 5 MG tablet    Sig: Take 1 tablet (5 mg total) by mouth daily.    Dispense:  90 tablet    Refill:  3    Patient Instructions  Medication Instructions:  Start Amlodipine 5 mg daily    *If you need a refill on your cardiac medications before your next appointment, please call your pharmacy*   Lab Work: None    Testing/Procedures: Our physician has recommended that you wear an [place] monitor. The Zio patch cardiac monitor continuously records heart rhythm data for up to 14 days, this is for patients being evaluated for multiple types heart rhythms. For the first 24 hours post application, please avoid getting the Zio monitor wet in the shower or by excessive sweating during exercise. After that, feel free to carry on with regular activities. Keep soaps and lotions away from the ZIO XT Patch.   Someone from our office will call to verify address and mail monitor.     Follow-Up: At [facility], you and your health needs are our priority.  As part of our continuing mission to provide  you with exceptional heart care, we have created designated Provider Care Teams.  These Care Teams include your primary Cardiologist (physician) and Advanced Practice Providers (APPs -  Physician Assistants and Nurse Practitioners) who all work together to provide you with the care you need, when you need it.  We recommend signing up for the patient portal called "MyChart".  Sign up information is provided on this After Visit Summary.  MyChart is used to connect with patients for Virtual Visits (Telemedicine).  Patients are able to view lab/test results, encounter notes, upcoming appointments, etc.  Non-urgent messages can be sent to your provider as well.   To learn more about what you can do with  MyChart, go to [email or web address].    Your next appointment:   4-6 week(s)  The format for your next appointment:   In Person  Provider:   Buford Dresser, MD  [place] Instructions   Your physician has requested you wear your ZIO patch monitor___14____days.   This is a single patch monitor.  [employer] supplies one patch monitor per enrollment.  Additional stickers are not available.   Please do not apply patch if you will be having a Nuclear Stress Test, Echocardiogram, Cardiac CT, MRI, or Chest Xray during the time frame you would be wearing the monitor. The patch cannot be worn during these tests.  You cannot remove and re-apply the ZIO XT patch monitor.   Your ZIO patch monitor will be sent USPS Priority mail from [facility] directly to your home address. The monitor may also be mailed to a PO BOX if home delivery is not available.   It may take 3-5 days to receive your monitor after you have been enrolled.   Once you have received you monitor, please review enclosed instructions.  Your monitor has already been registered assigning a specific monitor serial # to you.   Applying the monitor   Shave hair from upper left chest.   Hold abrader disc by  orange tab.  Rub abrader in 40 strokes over left upper chest as indicated in your monitor instructions.   Clean area with 4 enclosed alcohol pads .  Use all pads to assure are is cleaned thoroughly.  Let dry.   Apply patch as indicated in monitor instructions.  Patch will be place under collarbone on left side of chest with arrow pointing upward.   Rub patch adhesive wings for 2 minutes.Remove white label marked "1".  Remove white label marked "2".  Rub patch adhesive wings for 2 additional minutes.   While looking in a mirror, press and release button in center of patch.  A small green light will flash 3-4 times .  This will be your only indicator the monitor has been turned on.     Do not shower for the first 24 hours.  You may shower after the first 24 hours.   Press button if you feel a symptom. You will hear a small click.  Record Date, Time and Symptom in the Patient Log Book.   When you are ready to remove patch, follow instructions on last 2 pages of Patient Log Book.  Stick patch monitor onto last page of Patient Log Book.   Place Patient Log Book in [place] box.  Use locking tab on box and tape box closed securely.  The Orange and [employer] has [employer] on it.  Please place in mailbox as soon as possible.  Your physician should have your test results approximately 7 days after the monitor has been mailed back to [facility].   Call [place] at [phone] if you have questions regarding your ZIO XT patch monitor.  Call them immediately if you see an orange light blinking on your monitor.   If your monitor falls off in less than 4 days contact our Monitor department at [phone].  If your monitor becomes loose or falls off after 4 days call [employer] at [phone] for suggestions on securing your monitor.         Signed, Buford Dresser, MD PhD 07/10/2020  [place]

## 2020-07-10 NOTE — Patient Instructions (Addendum)
Medication Instructions:  Start Amlodipine 5 mg daily    *If you need a refill on your cardiac medications before your next appointment, please call your pharmacy*   Lab Work: None    Testing/Procedures: Our physician has recommended that you wear an [place] monitor. The Zio patch cardiac monitor continuously records heart rhythm data for up to 14 days, this is for patients being evaluated for multiple types heart rhythms. For the first 24 hours post application, please avoid getting the Zio monitor wet in the shower or by excessive sweating during exercise. After that, feel free to carry on with regular activities. Keep soaps and lotions away from the ZIO XT Patch.   Someone from our office will call to verify address and mail monitor.     Follow-Up: At [facility], you and your health needs are our priority.  As part of our continuing mission to provide you with exceptional heart care, we have created designated Provider Care Teams.  These Care Teams include your primary Cardiologist (physician) and Advanced Practice Providers (APPs -  Physician Assistants and Nurse Practitioners) who all work together to provide you with the care you need, when you need it.  We recommend signing up for the patient portal called "MyChart".  Sign up information is provided on this After Visit Summary.  MyChart is used to connect with patients for Virtual Visits (Telemedicine).  Patients are able to view lab/test results, encounter notes, upcoming appointments, etc.  Non-urgent messages can be sent to your provider as well.   To learn more about what you can do with MyChart, go to [email or web address].    Your next appointment:   4-6 week(s)  The format for your next appointment:   In Person  Provider:   Buford Dresser, MD  [place] Instructions   Your physician has requested you wear your ZIO patch monitor___14____days.   This is a single patch monitor.   [employer] supplies one patch monitor per enrollment.  Additional stickers are not available.   Please do not apply patch if you will be having a Nuclear Stress Test, Echocardiogram, Cardiac CT, MRI, or Chest Xray during the time frame you would be wearing the monitor. The patch cannot be worn during these tests.  You cannot remove and re-apply the ZIO XT patch monitor.   Your ZIO patch monitor will be sent USPS Priority mail from [facility] directly to your home address. The monitor may also be mailed to a PO BOX if home delivery is not available.   It may take 3-5 days to receive your monitor after you have been enrolled.   Once you have received you monitor, please review enclosed instructions.  Your monitor has already been registered assigning a specific monitor serial # to you.   Applying the monitor   Shave hair from upper left chest.   Hold abrader disc by orange tab.  Rub abrader in 40 strokes over left upper chest as indicated in your monitor instructions.   Clean area with 4 enclosed alcohol pads .  Use all pads to assure are is cleaned thoroughly.  Let dry.   Apply patch as indicated in monitor instructions.  Patch will be place under collarbone on left side of chest with arrow pointing upward.   Rub patch adhesive wings for 2 minutes.Remove white label marked "1".  Remove white label marked "2".  Rub patch adhesive wings for 2 additional minutes.   While looking in a  mirror, press and release button in center of patch.  A small green light will flash 3-4 times .  This will be your only indicator the monitor has been turned on.     Do not shower for the first 24 hours.  You may shower after the first 24 hours.   Press button if you feel a symptom. You will hear a small click.  Record Date, Time and Symptom in the Patient Log Book.   When you are ready to remove patch, follow instructions on last 2 pages of Patient Log Book.  Stick patch monitor onto last page of Patient  Log Book.   Place Patient Log Book in [place] box.  Use locking tab on box and tape box closed securely.  The [employer] has [employer] on it.  Please place in mailbox as soon as possible.  Your physician should have your test results approximately 7 days after the monitor has been mailed back to [facility].   Call [place] at [phone] if you have questions regarding your ZIO XT patch monitor.  Call them immediately if you see an orange light blinking on your monitor.   If your monitor falls off in less than 4 days contact our Monitor department at [phone].  If your monitor becomes loose or falls off after 4 days call [employer] at [phone] for suggestions on securing your monitor.

## 2020-07-18 ENCOUNTER — Other Ambulatory Visit (INDEPENDENT_AMBULATORY_CARE_PROVIDER_SITE_OTHER): Payer: BC Managed Care – PPO

## 2020-07-18 DIAGNOSIS — R002 Palpitations: Secondary | ICD-10-CM | POA: Diagnosis not present

## 2020-08-12 DIAGNOSIS — R002 Palpitations: Secondary | ICD-10-CM | POA: Diagnosis not present

## 2020-08-20 ENCOUNTER — Other Ambulatory Visit: Payer: Self-pay

## 2020-08-20 ENCOUNTER — Encounter: Payer: Self-pay | Admitting: Cardiology

## 2020-08-20 ENCOUNTER — Ambulatory Visit: Payer: BC Managed Care – PPO | Admitting: Cardiology

## 2020-08-20 VITALS — BP 110/82 | HR 103 | Ht 68.5 in | Wt 217.0 lb

## 2020-08-20 DIAGNOSIS — I10 Essential (primary) hypertension: Secondary | ICD-10-CM | POA: Diagnosis not present

## 2020-08-20 DIAGNOSIS — Z8249 Family history of ischemic heart disease and other diseases of the circulatory system: Secondary | ICD-10-CM

## 2020-08-20 DIAGNOSIS — R002 Palpitations: Secondary | ICD-10-CM | POA: Diagnosis not present

## 2020-08-20 DIAGNOSIS — Z7182 Exercise counseling: Secondary | ICD-10-CM

## 2020-08-20 DIAGNOSIS — Z713 Dietary counseling and surveillance: Secondary | ICD-10-CM

## 2020-08-20 DIAGNOSIS — Z712 Person consulting for explanation of examination or test findings: Secondary | ICD-10-CM

## 2020-08-20 DIAGNOSIS — E782 Mixed hyperlipidemia: Secondary | ICD-10-CM

## 2020-08-20 DIAGNOSIS — E6609 Other obesity due to excess calories: Secondary | ICD-10-CM

## 2020-08-20 DIAGNOSIS — Z6831 Body mass index (BMI) 31.0-31.9, adult: Secondary | ICD-10-CM

## 2020-08-20 NOTE — Patient Instructions (Addendum)
Medication Instructions:  We can trial cutting back on losartan. Decrease to 50 mg daily for a week, then if blood pressures consistently <140/90, can stop. If blood pressure rises >140/90, call and we will need to discuss medication options.  *If you need a refill on your cardiac medications before your next appointment, please call your pharmacy*   Lab Work: None ordered   Testing/Procedures: None ordered   Follow-Up: At [facility], you and your health needs are our priority.  As part of our continuing mission to provide you with exceptional heart care, we have created designated Provider Care Teams.  These Care Teams include your primary Cardiologist (physician) and Advanced Practice Providers (APPs -  Physician Assistants and Nurse Practitioners) who all work together to provide you with the care you need, when you need it.  We recommend signing up for the patient portal called "MyChart".  Sign up information is provided on this After Visit Summary.  MyChart is used to connect with patients for Virtual Visits (Telemedicine).  Patients are able to view lab/test results, encounter notes, upcoming appointments, etc.  Non-urgent messages can be sent to your provider as well.   To learn more about what you can do with MyChart, go to [email or web address].    Your next appointment:   6-8 week(s)  The format for your next appointment:   In Person  Provider:   Buford Dresser, MD   Other Instructions how to check blood pressure:  -sit comfortably in a chair, feet uncrossed and flat on floor, for 5-10 minutes  -arm ideally should rest at the level of the heart. However, arm should be relaxed and not tense (for example, do not hold the arm up unsupported)  -avoid exercise, caffeine, and tobacco for at least 30 minutes prior to BP reading  -don't take BP cuff reading over clothes (always place on skin directly)  -I prefer to know how well the medication is working, so I would  like you to take your readings 1-2 hours after taking your blood pressure medication if possible

## 2020-08-20 NOTE — Progress Notes (Signed)
Cardiology Office Note:    Date:  08/20/2020   ID:  Veronica [name], DOB 01-Dec-1960, MRN [id number]  PCP:  Abner Greenspan, MD  Cardiologist:  Buford Dresser, MD PhD  Referring MD: Abner Greenspan, MD   CC: follow up  History of Present Illness:    Veronica [name] is a 59 y.o. female with a hx of hyperlipidemia, hypertension who is seen in follow up. She was initially seen 10/02/18 as a new consult at the request of Veronica [name], Veronica Fanny, MD for the evaluation and management of family history of early CAD with hyperlipidemia. Last LDL was 262.  Cardiac history: see family history below. No personal history of xanthoma. Tried statin in the past (can't remember which one, per chart likely rosuvastatin), had severe memory issues with it. Had fatigue on beta blocker. Has anxiety. Has done fine with anesthesia in the past for procedures. Had a stress test in 2016 (echo stress). No ischemia on imaging. No recent syncope (vagal during teenage years).   Today: Feels very fatigued since starting amlodipine. No home BP readings. No motivation, doesn't want to get out of bed on the weekends. No active suicidal ideation, but has felt passive (ie if I had a heart attack and died, that would be ok by me). Has longstanding history of depression. Anhedonia major issue. Hasn't had a psychiatrist in about 5 years since hers retired.   Feels like losartan has never worked to control her blood pressure. Feels like she is on too many medications. Wants to try coming off of this to see if she feels better.   Takes medication between 8:30-9 every day, rarely forgets on the weekends.   Reviewed Zio results today. Noted that the most prominent times are related to work. Reviewed monitor together. Two brief SVT, but most of her triggered events are sinus, occasionally with PVC.  Has swelling in her legs by the end of the day. Wearing compression stockings.  Frustrated by her weight. Discussed diet, exercise.  Also discussed GLP1RA, has history of GI issues.   ROS positive for taste changes--things taste soapy/metallic. Had initially with Covid, then resolved, then recurred. Smell also changed. Has been worse after Covid booster but also the medication.   Denies chest pain, shortness of breath at rest or with normal exertion. No PND, orthopnea, or unexpected weight gain. No syncope or palpitations.   Rechecked BP, 122/78.   Past Medical History:  Diagnosis Date   Acne    Colonic mass 1/10   Crohn's disease ([place])    Depression    Edema    Hyperlipidemia    Hypertension    PMS (premenstrual syndrome)     Past Surgical History:  Procedure Laterality Date   HEMICOLECTOMY  1/10    Current Medications: Current Outpatient Medications on File Prior to Visit  Medication Sig   amLODipine (NORVASC) 5 MG tablet Take 1 tablet (5 mg total) by mouth daily.   clonazePAM (KLONOPIN) 0.5 MG tablet Take 1 tablet (0.5 mg total) by mouth daily as needed for anxiety.   losartan (COZAAR) 100 MG tablet Take 1 tablet (100 mg total) by mouth daily.   Amboy 420 MG/3.5ML SOCT INJECT 420MG INTO THE SKIN EVERY 30 DAYS   sertraline (ZOLOFT) 50 MG tablet Take 1 tablet (50 mg total) by mouth daily.   spironolactone (ALDACTONE) 25 MG tablet TAKE 1/2 TABLETS BY MOUTH DAILY.   No current facility-administered medications on file prior to visit.  Allergies:   Hydrochlorothiazide, Ace inhibitors, Crestor [rosuvastatin calcium], Fluoxetine hcl, Tenormin [atenolol], Venlafaxine, and Bupropion hcl   Social History   Tobacco Use   Smoking status: Never Smoker   Smokeless tobacco: Never Used  Substance Use Topics   Alcohol use: No    Alcohol/week: 0.0 standard drinks   Drug use: No     Family History: The patient's family history includes Alcohol abuse in her brother and son; Aneurysm in her father and paternal grandmother; Breast cancer in her mother; Colon cancer in her  mother; Coronary artery disease in her maternal grandfather and paternal grandfather; Depression in an other family member; Diabetes in her paternal grandfather; Drug abuse in her son; Hypertension in her paternal grandfather and paternal grandmother; Pulmonary embolism in her father.   Father: Cholesterol was over 400. Died age 91, ischemic heart disease. Had history of plaque in his legs causing clots. Unclear cause of death, either stroke or aneurysm. Mother: High cholesterol, total 258. On metoprolol only. On no meds for cholesterol [name]: died age 61 of MI [name]: died about age 9, had prior brain aneurysm 11 years prior to her death, died of stroke [name]: polymyalgia and giant cell arteritis, died age 69 of intestinal issues [name]: died of MI at age 68. Brother: 5 years [place], unknown medical history other than alcoholism Pat siblings: 55 sisters, 3 brothers, many with high cholesterol, stents including carotid and PAD, many with ischemic heart disease  Has two children, son age 73 and daughter age 86. No known health issues  ROS:   Please see the history of present illness.  Additional pertinent ROS negative except as noted in HPI.  EKGs/Labs/Other Studies Reviewed:    The following studies were reviewed today: Zio 09/01/20 14 days of data recorded on Zio monitor. Patient had a min HR of 58 bpm, max HR of 176 bpm, and avg HR of 90 bpm. Predominant underlying rhythm was Sinus Rhythm. 2 Supraventricular Tachycardia runs occurred, the run with the fastest interval lasting 5 beats with a max rate of 176 bpm, the longest lasting 7 beats with an avg rate of 112 bpm.No VT, atrial fibrillation, high degree block, or pauses noted. Isolated atrial and ventricular ectopy was rare (<1%). There were 23 triggered events. These were largely sinus, some with PVC.  CT coronary 11/24/18 1. Coronary calcium score of 0. This was 0 percentile for age and sex matched control. 2. Normal coronary  origin with right dominance. 3. No evidence of CAD.  Stress echo 05/15/15 - Stress ECG conclusions: 1 mm ST segment depressin in lateral   leads during recovery Non diagnostic due to baseline changes.   There were no stress arrhythmias or conduction abnormalities. The   stress ECG was negative for ischemia. - Staged echo: There was no echocardiographic evidence for   stress-induced ischemia. - Impressions: Low risk study Normal resting and stress echo   images. ECG with baseline changes and abnormal ST depression in   lateral leads in recovery.  Impressions: - Low risk study Normal resting and stress echo images. ECG with   baseline changes and abnormal ST depression in lateral leads in   recovery.   EKG:  EKG is personally reviewed.  The ekg ordered 07/10/20 demonstrates normal sinus rhythm at 82 bpm  Recent Labs: 09/10/2019: ALT 41; Hemoglobin 13.3; Platelets 385.0; TSH 4.49 10/26/2019: BUN 13; Creatinine, Ser 0.92; Potassium 4.3; Sodium 141  Recent Lipid Panel    Component Value Date/Time  CHOL 189 07/02/2020 0847   TRIG 224 (H) 07/02/2020 0847   HDL 48 07/02/2020 0847   CHOLHDL 3.9 07/02/2020 0847   CHOLHDL 3 09/10/2019 1010   VLDL 48.4 (H) 09/10/2019 1010   LDLCALC 103 (H) 07/02/2020 0847   LDLDIRECT 71.0 09/10/2019 1010    Physical Exam:    VS:  BP 110/82    Pulse (!) 103    Ht 5' 8.5" (1.74 m)    Wt 217 lb (98.4 kg)    SpO2 97%    BMI 32.51 kg/m    No data found.  Wt Readings from Last 3 Encounters:  08/20/20 217 lb (98.4 kg)  07/10/20 214 lb 6.4 oz (97.3 kg)  12/21/19 209 lb 9.6 oz (95.1 kg)    GEN: Well nourished, well developed in no acute distress HEENT: Normal, moist mucous membranes NECK: No JVD CARDIAC: regular rhythm, normal S1 and S2, no rubs or gallops. No murmur. VASCULAR: Radial and DP pulses 2+ bilaterally. No carotid bruits RESPIRATORY:  Clear to auscultation without rales, wheezing or rhonchi  ABDOMEN: Soft, non-tender,  non-distended MUSCULOSKELETAL:  Ambulates independently SKIN: Warm and dry, no edema NEUROLOGIC:  Alert and oriented x 3. No focal neuro deficits noted. PSYCHIATRIC:  Normal affect   ASSESSMENT:    1. Essential hypertension   2. Mixed hyperlipidemia   3. Family history of heart disease   4. Palpitations   5. Encounter to discuss test results   6. Class 1 obesity due to excess calories without serious comorbidity with body mass index (BMI) of 31.0 to 31.9 in adult   7. Exercise counseling   8. Nutritional counseling    PLAN:    Mixed hyperlipidemia, with hypercholesterolemia (severe) and hypertriglyceridemia: Likely heterozygous familial hypercholesterolemia -children should be screened with lipid panel, and discussed option of genetic testing. She has discussed this with her children -coronary CTA without evidence of established CAD -initiated PCSK9 inhibitor 09/2018, prior direct LDL 264 not on statin -has not tolerated statin in the past due to memory issues.  -counseled on both nutrition and exercise in addition to PCSK9 inhibitor.   Fatigue: Her blood pressure is stable and the best it has been in some time. I am concerned this may actually be a manifestation of her depression, as anhedonia is her main symptom. No active suicidal ideations but has passively thought it was ok if she died. She no longer has a psychiatrist and has had difficult to control symptoms in the past. I recommended she reach out to her psychiatry group and discuss her symptoms in more detail  Hypertension: goal <130/80. At goal today -see above re: fatigue. She feels that the losartan has done nothing for her. Despite amlodipine being the most recent medication, she would like to wean off losartan and see if she feels better -given strict instructions on monitoring home BP -continue amlodipine and spironolactone as ordered -diet and exercise, as noted  Palpitations: reviewed Zio monitor at length today.  Rare PVCs, most triggered events are sinus with or without a PVC. Two very brief episodes of SVT, no significant events. We discussed at length; she feels her symptoms are very related to work/stress  Prevention and health promotion counseling: family history of cardiovascular disease/high cholesterol as additional risk factor -recommend heart healthy/Mediterranean diet, with whole grains, fruits, vegetable, fish, lean meats, nuts, and olive oil. Limit salt. -recommend moderate walking, 3-5 times/week for 30-50 minutes each session. Aim for at least 150 minutes.week. Goal should be pace of 3  miles/hours, or walking 1.5 miles in 30 minutes -recommend avoidance of tobacco products. Avoid excess alcohol. -Weight: BMI 32, class 1 obesity. Discussed weight loss, diet, and exercise today  Plan for follow up: 6-8 weeks to monitor blood pressure and symptoms closely  Total time of encounter: 41 minutes total time of encounter, including 32 minutes spent in face-to-face patient care. This time includes coordination of care and counseling regarding blood pressure, symptoms, monitor results. Remainder of non-face-to-face time involved reviewing chart documents/testing relevant to the patient encounter and documentation in the medical record.  Buford Dresser, MD, PhD [place]   [facility]   Medication Adjustments/Labs and Tests Ordered: Current medicines are reviewed at length with the patient today.  Concerns regarding medicines are outlined above.  No orders of the defined types were placed in this encounter.  No orders of the defined types were placed in this encounter.   Patient Instructions  Medication Instructions:  We can trial cutting back on losartan. Decrease to 50 mg daily for a week, then if blood pressures consistently <140/90, can stop. If blood pressure rises >140/90, call and we will need to discuss medication options.  *If you need a refill on your cardiac medications  before your next appointment, please call your pharmacy*   Lab Work: None ordered   Testing/Procedures: None ordered   Follow-Up: At [facility], you and your health needs are our priority.  As part of our continuing mission to provide you with exceptional heart care, we have created designated Provider Care Teams.  These Care Teams include your primary Cardiologist (physician) and Advanced Practice Providers (APPs -  Physician Assistants and Nurse Practitioners) who all work together to provide you with the care you need, when you need it.  We recommend signing up for the patient portal called "MyChart".  Sign up information is provided on this After Visit Summary.  MyChart is used to connect with patients for Virtual Visits (Telemedicine).  Patients are able to view lab/test results, encounter notes, upcoming appointments, etc.  Non-urgent messages can be sent to your provider as well.   To learn more about what you can do with MyChart, go to [email or web address].    Your next appointment:   6-8 week(s)  The format for your next appointment:   In Person  Provider:   Buford Dresser, MD   Other Instructions how to check blood pressure:  -sit comfortably in a chair, feet uncrossed and flat on floor, for 5-10 minutes  -arm ideally should rest at the level of the heart. However, arm should be relaxed and not tense (for example, do not hold the arm up unsupported)  -avoid exercise, caffeine, and tobacco for at least 30 minutes prior to BP reading  -don't take BP cuff reading over clothes (always place on skin directly)  -I prefer to know how well the medication is working, so I would like you to take your readings 1-2 hours after taking your blood pressure medication if possible    Signed, Buford Dresser, MD PhD 08/20/2020  [place]

## 2020-08-24 ENCOUNTER — Encounter: Payer: Self-pay | Admitting: Cardiology

## 2020-09-09 ENCOUNTER — Telehealth: Payer: Self-pay

## 2020-09-09 NOTE — Telephone Encounter (Signed)
Called and lmomed the pt to call us back w/insurance information asap

## 2020-09-21 ENCOUNTER — Other Ambulatory Visit: Payer: Self-pay | Admitting: Cardiology

## 2020-09-23 ENCOUNTER — Other Ambulatory Visit: Payer: Self-pay | Admitting: Family Medicine

## 2020-09-24 ENCOUNTER — Other Ambulatory Visit: Payer: Self-pay | Admitting: Family Medicine

## 2020-09-29 ENCOUNTER — Telehealth: Payer: Self-pay | Admitting: Family Medicine

## 2020-09-29 DIAGNOSIS — R7303 Prediabetes: Secondary | ICD-10-CM

## 2020-09-29 DIAGNOSIS — E7800 Pure hypercholesterolemia, unspecified: Secondary | ICD-10-CM

## 2020-09-29 DIAGNOSIS — I10 Essential (primary) hypertension: Secondary | ICD-10-CM

## 2020-09-29 DIAGNOSIS — Z0000 Encounter for general adult medical examination without abnormal findings: Secondary | ICD-10-CM

## 2020-09-29 NOTE — Telephone Encounter (Signed)
-----   Message from Cloyd Stagers, RT sent at 09/22/2020  2:26 PM EST ----- Regarding: Lab Orders for Tuesday 11.23.2021 Please place lab orders for Tuesday 11.23.2021, office visit for physical on Tuesday 11.30.2021 Thank you, Dyke Maes RT(R)

## 2020-09-30 ENCOUNTER — Other Ambulatory Visit: Payer: Self-pay

## 2020-09-30 ENCOUNTER — Other Ambulatory Visit (INDEPENDENT_AMBULATORY_CARE_PROVIDER_SITE_OTHER): Payer: 59

## 2020-09-30 DIAGNOSIS — I10 Essential (primary) hypertension: Secondary | ICD-10-CM | POA: Diagnosis not present

## 2020-09-30 DIAGNOSIS — E7800 Pure hypercholesterolemia, unspecified: Secondary | ICD-10-CM | POA: Diagnosis not present

## 2020-09-30 DIAGNOSIS — R7303 Prediabetes: Secondary | ICD-10-CM | POA: Diagnosis not present

## 2020-09-30 LAB — COMPREHENSIVE METABOLIC PANEL
ALT: 38 U/L — ABNORMAL HIGH (ref 0–35)
AST: 21 U/L (ref 0–37)
Albumin: 4.4 g/dL (ref 3.5–5.2)
Alkaline Phosphatase: 157 U/L — ABNORMAL HIGH (ref 39–117)
BUN: 13 mg/dL (ref 6–23)
CO2: 30 mEq/L (ref 19–32)
Calcium: 10.1 mg/dL (ref 8.4–10.5)
Chloride: 101 mEq/L (ref 96–112)
Creatinine, Ser: 0.95 mg/dL (ref 0.40–1.20)
GFR: 66.39 mL/min (ref >60.00)
Glucose, Bld: 135 mg/dL — ABNORMAL HIGH (ref 70–99)
Potassium: 4.3 mEq/L (ref 3.5–5.1)
Sodium: 140 mEq/L (ref 135–145)
Total Bilirubin: 0.5 mg/dL (ref 0.2–1.2)
Total Protein: 7.4 g/dL (ref 6.0–8.3)

## 2020-09-30 LAB — LIPID PANEL
Cholesterol: 260 mg/dL — ABNORMAL HIGH (ref 0–200)
HDL: 47.3 mg/dL (ref >39.00)
NonHDL: 212.78
Total CHOL/HDL Ratio: 5
Triglycerides: 231 mg/dL — ABNORMAL HIGH (ref 0.0–149.0)
VLDL: 46.2 mg/dL — ABNORMAL HIGH (ref 0.0–40.0)

## 2020-09-30 LAB — CBC WITH DIFFERENTIAL/PLATELET
Basophils Absolute: 0.1 10*3/uL (ref 0.0–0.1)
Basophils Relative: 0.7 % (ref 0.0–3.0)
Eosinophils Absolute: 0.1 10*3/uL (ref 0.0–0.7)
Eosinophils Relative: 1 % (ref 0.0–5.0)
HCT: 41.5 % (ref 36.0–46.0)
Hemoglobin: 13.7 g/dL (ref 12.0–15.0)
Lymphocytes Relative: 32.1 % (ref 12.0–46.0)
Lymphs Abs: 2.5 10*3/uL (ref 0.7–4.0)
MCHC: 33.1 g/dL (ref 30.0–36.0)
MCV: 87.2 fl (ref 78.0–100.0)
Monocytes Absolute: 0.5 10*3/uL (ref 0.1–1.0)
Monocytes Relative: 6.7 % (ref 3.0–12.0)
Neutro Abs: 4.6 10*3/uL (ref 1.4–7.7)
Neutrophils Relative %: 59.5 % (ref 43.0–77.0)
Platelets: 431 10*3/uL — ABNORMAL HIGH (ref 150.0–400.0)
RBC: 4.76 Mil/uL (ref 3.87–5.11)
RDW: 13.4 % (ref 11.5–15.5)
WBC: 7.8 10*3/uL (ref 4.0–10.5)

## 2020-09-30 LAB — HEMOGLOBIN A1C: Hgb A1c MFr Bld: 6 % (ref 4.6–6.5)

## 2020-09-30 LAB — LDL CHOLESTEROL, DIRECT: Direct LDL: 189 mg/dL

## 2020-09-30 LAB — TSH: TSH: 2.67 u[IU]/mL (ref 0.35–4.50)

## 2020-10-06 ENCOUNTER — Ambulatory Visit (INDEPENDENT_AMBULATORY_CARE_PROVIDER_SITE_OTHER): Payer: 59 | Admitting: Cardiology

## 2020-10-06 ENCOUNTER — Other Ambulatory Visit: Payer: Self-pay

## 2020-10-06 ENCOUNTER — Encounter: Payer: Self-pay | Admitting: Cardiology

## 2020-10-06 VITALS — BP 140/94 | HR 106 | Ht 68.5 in | Wt 214.6 lb

## 2020-10-06 DIAGNOSIS — I10 Essential (primary) hypertension: Secondary | ICD-10-CM | POA: Diagnosis not present

## 2020-10-06 DIAGNOSIS — Z7189 Other specified counseling: Secondary | ICD-10-CM

## 2020-10-06 DIAGNOSIS — Z8249 Family history of ischemic heart disease and other diseases of the circulatory system: Secondary | ICD-10-CM

## 2020-10-06 DIAGNOSIS — R000 Tachycardia, unspecified: Secondary | ICD-10-CM

## 2020-10-06 DIAGNOSIS — E782 Mixed hyperlipidemia: Secondary | ICD-10-CM | POA: Diagnosis not present

## 2020-10-06 NOTE — Progress Notes (Signed)
Cardiology Office Note:    Date:  10/06/2020   ID:  Veronica [name], DOB 08-18-1961, MRN [id number]  PCP:  Veronica Greenspan, MD  Cardiologist:  Veronica Dresser, MD PhD  Referring MD: Veronica Greenspan, MD   CC: follow up  History of Present Illness:    Veronica [name] is a 59 y.o. female with a hx of hyperlipidemia, hypertension who is seen in follow up. She was initially seen 10/02/18 as a new consult at the request of Veronica [name], Veronica Fanny, MD for the evaluation and management of family history of early CAD with hyperlipidemia. Last LDL was 262.  Cardiac history: see family history below. No personal history of xanthoma. Tried statin in the past (can't remember which one, per chart likely rosuvastatin), had severe memory issues with it. Had fatigue on beta blocker. Has anxiety. Has done fine with anesthesia in the past for procedures. Had a stress test in 2016 (echo stress). No ischemia on imaging. No recent syncope (vagal during teenage years).   Today: Remains under a lot of stress with her job. Feels this is the main issue that affects her heart rate and blood pressure.   Had lipids drawn recent. LDL 189. Was 5 days late on repatha, took repatha the day before her lipids were drawn. Redraw in 3 mos, a week after or so after her repatha.  Her blood pressures have been variable. Reviewed her recent message re: losartan. Currently taking amlodipine 5 mg, losartan 100 mg daily, spironolactone 12.5 mg daily. Tried to cut back on losartan but blood pressure went to 166 systolic. Takes BP occasionally at home. Home BP 11/24 128/85.   Denies chest pain, shortness of breath at rest or with normal exertion. No PND, orthopnea, LE edema (now using compression stockings) or unexpected weight gain. No syncope or palpitations.  Past Medical History:  Diagnosis Date  . Acne   . Colonic mass 1/10  . Crohn's disease ([place])   . Depression   . Edema   . Hyperlipidemia   . Hypertension   . PMS  (premenstrual syndrome)     Past Surgical History:  Procedure Laterality Date  . HEMICOLECTOMY  1/10    Current Medications: Current Outpatient Medications on File Prior to Visit  Medication Sig  . amLODipine (NORVASC) 5 MG tablet Take 1 tablet (5 mg total) by mouth daily.  . clonazePAM (KLONOPIN) 0.5 MG tablet Take 1 tablet (0.5 mg total) by mouth daily as needed for anxiety.  Marland Kitchen losartan (COZAAR) 100 MG tablet TAKE 1 TABLET BY MOUTH EVERY DAY  . [place] 420 MG/3.5ML SOCT INJECT 420 MG INTO THE SKIN EVERY 30 DAYS  . sertraline (ZOLOFT) 50 MG tablet TAKE 1 TABLET BY MOUTH EVERY DAY  . spironolactone (ALDACTONE) 25 MG tablet TAKE 1/2 TABLETS BY MOUTH DAILY   No current facility-administered medications on file prior to visit.     Allergies:   Hydrochlorothiazide, Ace inhibitors, Crestor [rosuvastatin calcium], Fluoxetine hcl, Tenormin [atenolol], Venlafaxine, and Bupropion hcl   Social History   Tobacco Use  . Smoking status: Never Smoker  . Smokeless tobacco: Never Used  Substance Use Topics  . Alcohol use: No    Alcohol/week: 0.0 standard drinks  . Drug use: No     Family History: The patient's family history includes Alcohol abuse in her brother and son; Aneurysm in her father and paternal grandmother; Breast cancer in her mother; Colon cancer in her mother; Coronary artery disease in her maternal grandfather  and paternal grandfather; Depression in an other family member; Diabetes in her paternal grandfather; Drug abuse in her son; Hypertension in her paternal grandfather and paternal grandmother; Pulmonary embolism in her father.   Father: Cholesterol was over 400. Died age 56, ischemic heart disease. Had history of plaque in his legs causing clots. Unclear cause of death, either stroke or aneurysm. Mother: High cholesterol, total 258. On metoprolol only. On no meds for cholesterol [name]: died age 14 of MI [name]: died about age 56, had prior brain  aneurysm 11 years prior to her death, died of stroke [name]: polymyalgia and giant cell arteritis, died age 27 of intestinal issues [name]: died of MI at age 68. Brother: 5 years [place], unknown medical history other than alcoholism Pat siblings: 40 sisters, 3 brothers, many with high cholesterol, stents including carotid and PAD, many with ischemic heart disease  Has two children, son age 74 and daughter age 59. No known health issues  ROS:   Please see the history of present illness.  Additional pertinent ROS negative except as noted in HPI.  EKGs/Labs/Other Studies Reviewed:    The following studies were reviewed today: Zio August 27, 2020 14 days of data recorded on Zio monitor. Patient had a min HR of 58 bpm, max HR of 176 bpm, and avg HR of 90 bpm. Predominant underlying rhythm was Sinus Rhythm. 2 Supraventricular Tachycardia runs occurred, the run with the fastest interval lasting 5 beats with a max rate of 176 bpm, the longest lasting 7 beats with an avg rate of 112 bpm.No VT, atrial fibrillation, high degree block, or pauses noted. Isolated atrial and ventricular ectopy was rare (<1%). There were 23 triggered events. These were largely sinus, some with PVC.  CT coronary 11/24/18 1. Coronary calcium score of 0. This was 0 percentile for age and sex matched control. 2. Normal coronary origin with right dominance. 3. No evidence of CAD.  Stress echo 05/15/15 - Stress ECG conclusions: 1 mm ST segment depressin in lateral   leads during recovery Non diagnostic due to baseline changes.   There were no stress arrhythmias or conduction abnormalities. The   stress ECG was negative for ischemia. - Staged echo: There was no echocardiographic evidence for   stress-induced ischemia. - Impressions: Low risk study Normal resting and stress echo   images. ECG with baseline changes and abnormal ST depression in   lateral leads in recovery.  Impressions: - Low risk study Normal resting and  stress echo images. ECG with   baseline changes and abnormal ST depression in lateral leads in   recovery.   EKG:  EKG is personally reviewed.  The ekg ordered today demonstrates normal sinus rhythm at 96 bpm  Recent Labs: 09/30/2020: ALT 38; BUN 13; Creatinine, Ser 0.95; Hemoglobin 13.7; Platelets 431.0; Potassium 4.3; Sodium 140; TSH 2.67  Recent Lipid Panel    Component Value Date/Time   CHOL 260 (H) 09/30/2020 0829   CHOL 189 07/02/2020 0847   TRIG 231.0 (H) 09/30/2020 0829   HDL 47.30 09/30/2020 0829   HDL 48 07/02/2020 0847   CHOLHDL 5 09/30/2020 0829   VLDL 46.2 (H) 09/30/2020 0829   LDLCALC 103 (H) 07/02/2020 0847   LDLDIRECT 189.0 09/30/2020 0829    Physical Exam:    VS:  BP (!) 140/94 (BP Location: Left Arm, Patient Position: Sitting)   Pulse (!) 106   Ht 5' 8.5" (1.74 m)   Wt 214 lb 9.6 oz (97.3 kg)   SpO2  98%   BMI 32.16 kg/m    No data found.  Wt Readings from Last 3 Encounters:  10/06/20 214 lb 9.6 oz (97.3 kg)  08/20/20 217 lb (98.4 kg)  07/10/20 214 lb 6.4 oz (97.3 kg)    GEN: Well nourished, well developed in no acute distress HEENT: Normal, moist mucous membranes NECK: No JVD CARDIAC: regular rhythm, normal S1 and S2, no rubs or gallops. No murmur. VASCULAR: Radial and DP pulses 2+ bilaterally. No carotid bruits RESPIRATORY:  Clear to auscultation without rales, wheezing or rhonchi  ABDOMEN: Soft, non-tender, non-distended MUSCULOSKELETAL:  Ambulates independently SKIN: Warm and dry, no edema NEUROLOGIC:  Alert and oriented x 3. No focal neuro deficits noted. PSYCHIATRIC:  Normal affect   ASSESSMENT:    1. Essential hypertension   2. Mixed hyperlipidemia   3. Tachycardia   4. Family history of heart disease   5. Cardiac risk counseling   6. Counseling on health promotion and disease prevention    PLAN:    Mixed hyperlipidemia, with hypercholesterolemia (severe) and hypertriglyceridemia: Likely heterozygous familial  hypercholesterolemia -children should be screened with lipid panel, and discussed option of genetic testing. She has discussed this with her children -coronary CTA without evidence of established CAD -initiated PCSK9 inhibitor 09/2018, prior direct LDL 264 not on statin -has not tolerated statin in the past due to memory issues.  -counseled on both nutrition and exercise in addition to PCSK9 inhibitor.  -most recent LDL not at goal, but this was 1 day after her repatha injection (which was about 5 days late). We will recheck a fasting lipid panel in 3 mos. If LDL remains elevated, will ask the [facility] lipid clinic to see her again  Hypertension: goal <130/80. At goal at home, elevated here today. -stress is likely a significant factor in her readings -continue amlodipine, spironolactone, losartan -diet and exercise, as noted  Tachycardia Palpitations -Zio monitor as above, no significant arrhythmias -sinus tachycardia today  Prevention and health promotion counseling: family history of cardiovascular disease/high cholesterol as additional risk factor -recommend heart healthy/Mediterranean diet, with whole grains, fruits, vegetable, fish, lean meats, nuts, and olive oil. Limit salt. -recommend moderate walking, 3-5 times/week for 30-50 minutes each session. Aim for at least 150 minutes.week. Goal should be pace of 3 miles/hours, or walking 1.5 miles in 30 minutes -recommend avoidance of tobacco products. Avoid excess alcohol. -Weight: BMI 32, class 1 obesity.  -A1c 6.0, abnormal glucose tolerance  Plan for follow up: 6 mos or sooner as needed. Fasting lipids in 3 mos.  Veronica Dresser, MD, PhD [place]  [facility]   Medication Adjustments/Labs and Tests Ordered: Current medicines are reviewed at length with the patient today.  Concerns regarding medicines are outlined above.  Orders Placed This Encounter  Procedures  . Lipid panel  . EKG 12-Lead   No orders of the  defined types were placed in this encounter.   Patient Instructions  Medication Instructions:  No changes *If you need a refill on your cardiac medications before your next appointment, please call your pharmacy*  Lab Work: Your physician recommends that you return for lab work in: 3 months (Fasting Lipids)  Testing/Procedures: None ordered this visit  Follow-Up: At [facility], you and your health needs are our priority.  As part of our continuing mission to provide you with exceptional heart care, we have created designated Provider Care Teams.  These Care Teams include your primary Cardiologist (physician) and Advanced Practice Providers (APPs -  Physician Assistants  and Nurse Practitioners) who all work together to provide you with the care you need, when you need it.    Your next appointment:   6 month(s)  You will receive a reminder letter in the mail two months in advance. If you don't receive a letter, please call our office to schedule the follow-up appointment.  The format for your next appointment:   In Person  Provider:   Buford Dresser, MD     Signed, Veronica Dresser, MD PhD 10/06/2020  [place]

## 2020-10-06 NOTE — Patient Instructions (Signed)
Medication Instructions:  No changes *If you need a refill on your cardiac medications before your next appointment, please call your pharmacy*  Lab Work: Your physician recommends that you return for lab work in: 3 months (Fasting Lipids)  Testing/Procedures: None ordered this visit  Follow-Up: At [facility], you and your health needs are our priority.  As part of our continuing mission to provide you with exceptional heart care, we have created designated Provider Care Teams.  These Care Teams include your primary Cardiologist (physician) and Advanced Practice Providers (APPs -  Physician Assistants and Nurse Practitioners) who all work together to provide you with the care you need, when you need it.    Your next appointment:   6 month(s)  You will receive a reminder letter in the mail two months in advance. If you don't receive a letter, please call our office to schedule the follow-up appointment.  The format for your next appointment:   In Person  Provider:   Buford Dresser, MD

## 2020-10-07 ENCOUNTER — Ambulatory Visit (INDEPENDENT_AMBULATORY_CARE_PROVIDER_SITE_OTHER): Payer: 59 | Admitting: Family Medicine

## 2020-10-07 ENCOUNTER — Encounter: Payer: Self-pay | Admitting: Family Medicine

## 2020-10-07 VITALS — BP 122/76 | HR 99 | Temp 96.9°F | Ht 68.5 in | Wt 212.3 lb

## 2020-10-07 DIAGNOSIS — Z0000 Encounter for general adult medical examination without abnormal findings: Secondary | ICD-10-CM

## 2020-10-07 DIAGNOSIS — F341 Dysthymic disorder: Secondary | ICD-10-CM | POA: Diagnosis not present

## 2020-10-07 DIAGNOSIS — E6609 Other obesity due to excess calories: Secondary | ICD-10-CM | POA: Diagnosis not present

## 2020-10-07 DIAGNOSIS — E7800 Pure hypercholesterolemia, unspecified: Secondary | ICD-10-CM

## 2020-10-07 DIAGNOSIS — Z6831 Body mass index (BMI) 31.0-31.9, adult: Secondary | ICD-10-CM

## 2020-10-07 DIAGNOSIS — I10 Essential (primary) hypertension: Secondary | ICD-10-CM | POA: Diagnosis not present

## 2020-10-07 DIAGNOSIS — R748 Abnormal levels of other serum enzymes: Secondary | ICD-10-CM

## 2020-10-07 DIAGNOSIS — K50919 Crohn's disease, unspecified, with unspecified complications: Secondary | ICD-10-CM

## 2020-10-07 DIAGNOSIS — R7303 Prediabetes: Secondary | ICD-10-CM

## 2020-10-07 MED ORDER — SPIRONOLACTONE 25 MG PO TABS: ORAL_TABLET | ORAL | 3 refills | Status: DC

## 2020-10-07 MED ORDER — LOSARTAN POTASSIUM 100 MG PO TABS: 100.0000 mg | ORAL_TABLET | Freq: Every day | ORAL | 3 refills | Status: DC

## 2020-10-07 MED ORDER — SERTRALINE HCL 50 MG PO TABS: 50.0000 mg | ORAL_TABLET | Freq: Every day | ORAL | 3 refills | Status: DC

## 2020-10-07 NOTE — Assessment & Plan Note (Signed)
Lab Results  Component Value Date   HGBA1C 6.0 09/30/2020   disc imp of low glycemic diet and wt loss to prevent DM2

## 2020-10-07 NOTE — Assessment & Plan Note (Signed)
This and ALT are intermittently elevated since 2009 No clinical symptoms or changes  Enc her to watch diet and work on wt loss for possible fatty liver She avoids etoh and tylenol

## 2020-10-07 NOTE — Assessment & Plan Note (Signed)
Reviewed health habits including diet and exercise and skin cancer prevention Reviewed appropriate screening tests for age  Also reviewed health mt list, fam hx and immunization status , as well as social and family history   See HPI Labs reviewed  Pt plans to schedule mammogram- info given to her  Declines pap or pelvic exam  covid vaccinated (also had covid and myoclonal ab inf)  Discussed shingrix vaccine

## 2020-10-07 NOTE — Assessment & Plan Note (Signed)
utd with colonoscopy

## 2020-10-07 NOTE — Assessment & Plan Note (Signed)
bp in fair control at this time  BP Readings from Last 1 Encounters:  10/07/20 122/76   No changes needed Most recent labs reviewed  Disc lifstyle change with low sodium diet and exercise  Plan to continue amlodipine 5 mg daily , losartan 100 mg daily and spironolactone 25 mg daily  Pulse remains high baseline but per pt better at home

## 2020-10-07 NOTE — Assessment & Plan Note (Signed)
Discussed how this problem influences overall health and the risks it imposes  Reviewed plan for weight loss with lower calorie diet (via better food choices and also portion control or program like weight watchers) and exercise building up to or more than 30 minutes 5 days per week including some aerobic activity   Enc to add exercise when she can

## 2020-10-07 NOTE — Assessment & Plan Note (Signed)
Disc goals for lipids and reasons to control them Rev last labs with pt Rev low sat fat diet in detail Missed a repatha dose and LDL rose significantly  Will re check with cardiology in 3 mo

## 2020-10-07 NOTE — Progress Notes (Signed)
Subjective:    Patient ID: Veronica [name], female    DOB: 11/17/1960, 59 y.o.   MRN: [id number]  This visit occurred during the SARS-CoV-2 public health emergency.  Safety protocols were in place, including screening questions prior to the visit, additional usage of staff PPE, and extensive cleaning of exam room while observing appropriate contact time as indicated for disinfecting solutions.    HPI  Here for health maintenance exam and to review chronic medical problems    Wt Readings from Last 3 Encounters:  10/07/20 212 lb 5 oz (96.3 kg)  10/06/20 214 lb 9.6 oz (97.3 kg)  08/20/20 217 lb (98.4 kg)   31.81 kg/m   Planning to retire at age 25 Very stressful job   Mammogram 9/18  Self breast exam -no lumps or chagnes   Pap 10/17- no problems and declines pap and pelvic   Colonoscopy 2/21 -5 y recall due to family hx  Tdap 1/13 Flu shot 10/21 covid vaccine [employer]   (she had covid as well and had myoclonal ab infusion)  Zoster status - may consider shingrix later   HTN  bp is stable today  No cp or palpitations or headaches or edema  No side effects to medicines  BP Readings from Last 3 Encounters:  10/07/20 122/76  10/06/20 (!) 140/94  08/20/20 110/82    Taking amlodipine 5 mg daily Losartan 100 mg daily  Spironolactone 25 mg daily   Pulse Readings from Last 3 Encounters:  10/07/20 99  10/06/20 (!) 106  08/20/20 (!) 103   pulse is always high - it drops when she settles down (is her baseline even when [place])  Had EKG at another office    H/o anx/depression  Taking sertraline 50 mg daily  Mood is ok  Takes delta 8 (like CBD oil but from hemp) -does use on Friday night   Hyperlipidemia  Lab Results  Component Value Date   CHOL 260 (H) 09/30/2020   CHOL 189 07/02/2020   CHOL 180 12/17/2019   Lab Results  Component Value Date   HDL 47.30 09/30/2020   HDL 48 07/02/2020   HDL 46 12/17/2019   Lab Results  Component Value Date   LDLCALC 103 (H)  07/02/2020   LDLCALC 100 (H) 12/17/2019   LDLCALC 100 (H) 06/29/2019   Lab Results  Component Value Date   TRIG 231.0 (H) 09/30/2020   TRIG 224 (H) 07/02/2020   TRIG 199 (H) 12/17/2019   Lab Results  Component Value Date   CHOLHDL 5 09/30/2020   CHOLHDL 3.9 07/02/2020   CHOLHDL 3.9 12/17/2019   Lab Results  Component Value Date   LDLDIRECT 189.0 09/30/2020   LDLDIRECT 71.0 09/10/2019   LDLDIRECT 262.0 08/31/2018  her LDL went up to 189  (she had insurance issues and was a week late taking it)  Will re check in 3 month  Taking repatha now  Intolerant to statins   Prediabetes  Lab Results  Component Value Date   HGBA1C 6.0 09/30/2020   prev 5.9   Lab Results  Component Value Date   CREATININE 0.95 09/30/2020   BUN 13 09/30/2020   NA 140 09/30/2020   K 4.3 09/30/2020   CL 101 09/30/2020   CO2 30 09/30/2020   Lab Results  Component Value Date   ALT 38 (H) 09/30/2020   AST 21 09/30/2020   ALKPHOS 157 (H) 09/30/2020   BILITOT 0.5 09/30/2020   Elevated alk phos  Has been up  to 135 in the past  ALT 38-was 41 last check  09 nl abd ultrasound   Was higher in past at time of colonoscopy   No etoh occ takes tylenol-not regularly   Lab Results  Component Value Date   WBC 7.8 09/30/2020   HGB 13.7 09/30/2020   HCT 41.5 09/30/2020   MCV 87.2 09/30/2020   PLT 431.0 (H) 09/30/2020   Lab Results  Component Value Date   TSH 2.67 09/30/2020     Patient Active Problem List   Diagnosis Date Noted  . Elevated alkaline phosphatase level 10/07/2020  . Family history of colon cancer 09/19/2019  . Family history of cerebral aneurysm 09/01/2016  . Obesity 09/01/2016  . Prediabetes 08/31/2016  . Family history of early CAD 05/07/2015  . Palpitations 03/11/2015  . Encounter for routine gynecological examination 03/11/2015  . Exercise intolerance 03/11/2015  . Tachycardia 07/16/2014  . Hyperlipidemia 11/16/2011  . Other screening mammogram 11/16/2011  .  Fibrocystic breast 11/16/2011  . Routine general medical examination at a health care facility 11/07/2011  . 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 Regional enteritis ([place]) 11/25/2008  . Essential hypertension 08/22/2008  . OTHER ABNORMAL BLOOD CHEMISTRY 08/01/2008  . ANXIETY DEPRESSION 06/27/2008  . ACNE, MILD 06/27/2008  . EDEMA 06/27/2008   Past Medical History:  Diagnosis Date  . Acne   . Colonic mass 1/10  . Crohn's disease ([place])   . Depression   . Edema   . Hyperlipidemia   . Hypertension   . PMS (premenstrual syndrome)    Past Surgical History:  Procedure Laterality Date  . HEMICOLECTOMY  1/10   Social History   Tobacco Use  . Smoking status: Never Smoker  . Smokeless tobacco: Never Used  Substance Use Topics  . Alcohol use: No    Alcohol/week: 0.0 standard drinks  . Drug use: No   Family History  Problem Relation Age of Onset  . Colon cancer Mother   . Breast cancer Mother   . Alcohol abuse Brother   . Hypertension Paternal Grandfather   . Diabetes Paternal Grandfather   . Coronary artery disease Paternal Grandfather   . Coronary artery disease Maternal Grandfather   . Aneurysm Paternal Grandmother        cerebral  . Hypertension Paternal Grandmother   . Pulmonary embolism Father   . Aneurysm Father        cerebral  . Alcohol abuse Son   . Drug abuse Son   . Depression Other        fam hx   Allergies  Allergen Reactions  . Hydrochlorothiazide     Dizzy and felt faint  . Ace Inhibitors     cough  . Crestor [Rosuvastatin [place]     Memory   . Fluoxetine Hcl     REACTION: urticartia  . Tenormin [Atenolol]     fatigue  . Venlafaxine     REACTION: weight gain, makes sleepy  . Bupropion Hcl     REACTION: tinnitus previously but no other symptoms.  She tolerated it on second trial w/o return of tinnitus.     Current Outpatient Medications on File Prior to Visit  Medication Sig Dispense Refill  . amLODipine (NORVASC) 5 MG  tablet Take 1 tablet (5 mg total) by mouth daily. 90 tablet 3  . clonazePAM (KLONOPIN) 0.5 MG tablet Take 1 tablet (0.5 mg total) by mouth daily as needed for anxiety. 30 tablet 0  . [place]  420 MG/3.5ML SOCT INJECT 420 MG INTO THE SKIN EVERY 30 DAYS 3.5 mL 11   No current facility-administered medications on file prior to visit.    Review of Systems  Constitutional: Negative for activity change, appetite change, fatigue, fever and unexpected weight change.  HENT: Negative for congestion, ear pain, rhinorrhea, sinus pressure and sore throat.   Eyes: Negative for pain, redness and visual disturbance.  Respiratory: Negative for cough, shortness of breath and wheezing.   Cardiovascular: Negative for chest pain and palpitations.  Gastrointestinal: Negative for abdominal pain, blood in stool, constipation and diarrhea.  Endocrine: Negative for polydipsia and polyuria.  Genitourinary: Negative for dysuria, frequency and urgency.  Musculoskeletal: Negative for arthralgias, back pain and myalgias.  Skin: Negative for pallor and rash.  Allergic/Immunologic: Negative for environmental allergies.  Neurological: Negative for dizziness, syncope and headaches.  Hematological: Negative for adenopathy. Does not bruise/bleed easily.  Psychiatric/Behavioral: Negative for decreased concentration and dysphoric mood. The patient is not nervous/anxious.        High stress        Objective:   Physical Exam Constitutional:      General: She is not in acute distress.    Appearance: Normal appearance. She is well-developed. She is obese. She is not ill-appearing or diaphoretic.  HENT:     Head: Normocephalic and atraumatic.     Right Ear: Tympanic membrane, ear canal and external ear normal.     Left Ear: Tympanic membrane, ear canal and external ear normal.     Nose: Nose normal. No congestion.     Mouth/Throat:     Mouth: Mucous membranes are moist.     Pharynx: Oropharynx is clear. No  posterior oropharyngeal erythema.  Eyes:     General: No scleral icterus.    Extraocular Movements: Extraocular movements intact.     Conjunctiva/sclera: Conjunctivae normal.     Pupils: Pupils are equal, round, and reactive to light.  Neck:     Thyroid: No thyromegaly.     Vascular: No carotid bruit or JVD.  Cardiovascular:     Rate and Rhythm: Normal rate and regular rhythm.     Pulses: Normal pulses.     Heart sounds: Normal heart sounds. No gallop.   Pulmonary:     Effort: Pulmonary effort is normal. No respiratory distress.     Breath sounds: Normal breath sounds. No wheezing.     Comments: Good air exch Chest:     Chest wall: No tenderness.  Abdominal:     General: Bowel sounds are normal. There is no distension or abdominal bruit.     Palpations: Abdomen is soft. There is no mass.     Tenderness: There is no abdominal tenderness.     Hernia: No hernia is present.  Genitourinary:    Comments: Breast exam: No mass, nodules, thickening, tenderness, bulging, retraction, inflamation, nipple discharge or skin changes noted.  No axillary or clavicular LA.     Musculoskeletal:        General: No tenderness. Normal range of motion.     Cervical back: Normal range of motion and neck supple. No rigidity. No muscular tenderness.     Right lower leg: No edema.     Left lower leg: No edema.     Comments: No kyphosis or acute joint changes   Lymphadenopathy:     Cervical: No cervical adenopathy.  Skin:    General: Skin is warm and dry.     Coloration: Skin is not pale.  Findings: No erythema or rash.     Comments: Lentigines and brown nevi on trunk  Neurological:     Mental Status: She is alert. Mental status is at baseline.     Cranial Nerves: No cranial nerve deficit.     Motor: No abnormal muscle tone.     Coordination: Coordination normal.     Gait: Gait normal.     Deep Tendon Reflexes: Reflexes are normal and symmetric. Reflexes normal.  Psychiatric:        Mood and  Affect: Mood normal.        Cognition and Memory: Cognition and memory normal.     Comments: Pleasant            Assessment & Plan:   Problem List Items Addressed This Visit      Cardiovascular and Mediastinum   Essential hypertension    bp in fair control at this time  BP Readings from Last 1 Encounters:  10/07/20 122/76   No changes needed Most recent labs reviewed  Disc lifstyle change with low sodium diet and exercise  Plan to continue amlodipine 5 mg daily , losartan 100 mg daily and spironolactone 25 mg daily  Pulse remains high baseline but per pt better at home      Relevant Medications   losartan (COZAAR) 100 MG tablet   spironolactone (ALDACTONE) 25 MG tablet     Digestive   Regional enteritis [facility])    utd with colonoscopy        Other   ANXIETY DEPRESSION    Despite stressors-pt does fairly well with sertraline 50 mg daily  Reviewed stressors/ coping techniques/symptoms/ support sources/ tx options and side effects in detail today Planning retirement early to alleviate stress      Relevant Medications   sertraline (ZOLOFT) 50 MG tablet   Routine general medical examination at a health care facility - Primary    Reviewed health habits including diet and exercise and skin cancer prevention Reviewed appropriate screening tests for age  Also reviewed health mt list, fam hx and immunization status , as well as social and family history   See HPI Labs reviewed  Pt plans to schedule mammogram- info given to her  Declines pap or pelvic exam  covid vaccinated (also had covid and myoclonal ab inf)  Discussed shingrix vaccine        Hyperlipidemia    Disc goals for lipids and reasons to control them Rev last labs with pt Rev low sat fat diet in detail Missed a repatha dose and LDL rose significantly  Will re check with cardiology in 3 mo       Relevant Medications   losartan (COZAAR) 100 MG tablet   spironolactone (ALDACTONE) 25 MG tablet    Prediabetes    Lab Results  Component Value Date   HGBA1C 6.0 09/30/2020   disc imp of low glycemic diet and wt loss to prevent DM2       Obesity    Discussed how this problem influences overall health and the risks it imposes  Reviewed plan for weight loss with lower calorie diet (via better food choices and also portion control or program like weight watchers) and exercise building up to or more than 30 minutes 5 days per week including some aerobic activity   Enc to add exercise when she can      Elevated alkaline phosphatase level    This and ALT are intermittently elevated since 2009 No clinical symptoms or  changes  Enc her to watch diet and work on wt loss for possible fatty liver She avoids etoh and tylenol

## 2020-10-07 NOTE — Patient Instructions (Addendum)
If you are interested in the shingles vaccine series (Shingrix), call your insurance or pharmacy to check on coverage and location it must be given.  If affordable - you can schedule it here or at your pharmacy depending on coverage   Please schedule your mammogram at the Breast center   Take care of yourself  Try to add in some exercise when you can  This makes a difference with weight and mood   To prevent diabetes Try to get most of your carbohydrates from produce (with the exception of white potatoes)  Eat less bread/pasta/rice/snack foods/cereals/sweets and other items from the middle of the grocery store (processed carbs)

## 2020-10-07 NOTE — Assessment & Plan Note (Signed)
Despite stressors-pt does fairly well with sertraline 50 mg daily  Reviewed stressors/ coping techniques/symptoms/ support sources/ tx options and side effects in detail today Planning retirement early to alleviate stress

## 2021-02-25 ENCOUNTER — Encounter: Payer: Self-pay | Admitting: Family Medicine

## 2021-04-02 LAB — LIPID PANEL
Chol/HDL Ratio: 4.5 ratio — ABNORMAL HIGH (ref 0.0–4.4)
Cholesterol, Total: 187 mg/dL (ref 100–199)
HDL: 42 mg/dL (ref >39)
LDL Chol Calc (NIH): 113 mg/dL — ABNORMAL HIGH (ref 0–99)
Triglycerides: 182 mg/dL — ABNORMAL HIGH (ref 0–149)
VLDL Cholesterol Cal: 32 mg/dL (ref 5–40)

## 2021-04-07 ENCOUNTER — Ambulatory Visit: Payer: 59 | Admitting: Cardiology

## 2021-04-07 ENCOUNTER — Encounter: Payer: Self-pay | Admitting: Cardiology

## 2021-04-07 ENCOUNTER — Other Ambulatory Visit: Payer: Self-pay

## 2021-04-07 VITALS — BP 132/90 | HR 119 | Ht 68.5 in | Wt 208.0 lb

## 2021-04-07 DIAGNOSIS — R000 Tachycardia, unspecified: Secondary | ICD-10-CM | POA: Diagnosis not present

## 2021-04-07 DIAGNOSIS — E782 Mixed hyperlipidemia: Secondary | ICD-10-CM

## 2021-04-07 DIAGNOSIS — Z8249 Family history of ischemic heart disease and other diseases of the circulatory system: Secondary | ICD-10-CM

## 2021-04-07 DIAGNOSIS — I10 Essential (primary) hypertension: Secondary | ICD-10-CM

## 2021-04-07 DIAGNOSIS — Z7189 Other specified counseling: Secondary | ICD-10-CM

## 2021-04-07 NOTE — Patient Instructions (Addendum)
Medication Instructions:  Your Physician recommend you continue on your current medication as directed.    *If you need a refill on your cardiac medications before your next appointment, please call your pharmacy*   Lab Work: None ordered today   Testing/Procedures: None ordered today   Follow-Up: At [facility], you and your health needs are our priority.  As part of our continuing mission to provide you with exceptional heart care, we have created designated Provider Care Teams.  These Care Teams include your primary Cardiologist (physician) and Advanced Practice Providers (APPs -  Physician Assistants and Nurse Practitioners) who all work together to provide you with the care you need, when you need it.  We recommend signing up for the patient portal called "MyChart".  Sign up information is provided on this After Visit Summary.  MyChart is used to connect with patients for Virtual Visits (Telemedicine).  Patients are able to view lab/test results, encounter notes, upcoming appointments, etc.  Non-urgent messages can be sent to your provider as well.   To learn more about what you can do with MyChart, go to [email or web address].    Your next appointment:   6 month(s) @ [address] [place], [place] [address]   The format for your next appointment:   In Person  Provider:   Buford Dresser, MD

## 2021-04-07 NOTE — Progress Notes (Signed)
Cardiology Office Note:    Date:  04/07/2021   ID:  Veronica [name], DOB Oct 29, 1961, MRN [id number]  PCP:  Abner Greenspan, MD  Cardiologist:  Buford Dresser, MD PhD  Referring MD: Abner Greenspan, MD   CC: follow up  History of Present Illness:    Veronica [name] is a 60 y.o. female with a hx of hyperlipidemia, hypertension who is seen in follow up. She was initially seen 10/02/18 as a new consult at the request of [name], [name], MD for the evaluation and management of family history of early CAD with hyperlipidemia. Last LDL was 262.  Cardiac history: see family history below. No personal history of xanthoma. Tried statin in the past (can't remember which one, per chart likely rosuvastatin), had severe memory issues with it. Had fatigue on beta blocker. Has anxiety. Has done fine with anesthesia in the past for procedures. Had a stress test in 2016 (echo stress). No ischemia on imaging. No recent syncope (vagal during teenage years).   Today: She is feeling fine overall, aside from constantly feeling fatigued. Also she is noting some increased memory loss, and wonders how much of this is due to aging or due to her medication. Of note she is under increased stress due to her son being infected with COVID. She has lost some weight since her last visit, and continues to work on improving her diet.   Occasionally when she wakes up in the morning, she experiences significant tightness in her right heel and lower posterior RE.   Lately, she is having a lot of pain behind her right eye, but no vision loss. She has felt this symptom before and is not too concerned with this. She will follow up with opthalmology soon.   She denies any chest pain, shortness of breath, palpitations, or exertional symptoms. No headaches, lightheadedness, or syncope to report. Also has no lower extremity edema, orthopnea or PND.   Past Medical History:  Diagnosis Date  . Acne   . Colonic mass 1/10  .  Crohn's disease ([place])   . Depression   . Edema   . Hyperlipidemia   . Hypertension   . PMS (premenstrual syndrome)     Past Surgical History:  Procedure Laterality Date  . HEMICOLECTOMY  1/10    Current Medications: Current Outpatient Medications on File Prior to Visit  Medication Sig  . amLODipine (NORVASC) 5 MG tablet Take 1 tablet (5 mg total) by mouth daily.  . clonazePAM (KLONOPIN) 0.5 MG tablet Take 1 tablet (0.5 mg total) by mouth daily as needed for anxiety.  Marland Kitchen losartan (COZAAR) 100 MG tablet Take 1 tablet (100 mg total) by mouth daily.  Marland Kitchen [place] 420 MG/3.5ML SOCT INJECT 420 MG INTO THE SKIN EVERY 30 DAYS  . sertraline (ZOLOFT) 50 MG tablet Take 1 tablet (50 mg total) by mouth daily.  Marland Kitchen spironolactone (ALDACTONE) 25 MG tablet TAKE 1/2 TABLETS BY MOUTH DAILY   No current facility-administered medications on file prior to visit.     Allergies:   Hydrochlorothiazide, Ace inhibitors, Crestor [rosuvastatin calcium], Fluoxetine hcl, Tenormin [atenolol], Venlafaxine, and Bupropion hcl   Social History   Tobacco Use  . Smoking status: Never Smoker  . Smokeless tobacco: Never Used  Substance Use Topics  . Alcohol use: No    Alcohol/week: 0.0 standard drinks  . Drug use: No     Family History: The patient's family history includes Alcohol abuse in her brother and son; Aneurysm  in her father and paternal grandmother; Breast cancer in her mother; Colon cancer in her mother; Coronary artery disease in her maternal grandfather and paternal grandfather; Depression in an other family member; Diabetes in her paternal grandfather; Drug abuse in her son; Hypertension in her paternal grandfather and paternal grandmother; Pulmonary embolism in her father.   Father: Cholesterol was over 400. Died age 55, ischemic heart disease. Had history of plaque in his legs causing clots. Unclear cause of death, either stroke or aneurysm. Mother: High cholesterol, total 258. On  metoprolol only. On no meds for cholesterol [name]: died age 95 of MI [name]: died about age 60, had prior brain aneurysm 11 years prior to her death, died of stroke [name]: polymyalgia and giant cell arteritis, died age 81 of intestinal issues [name]: died of MI at age 43. Brother: 5 years Sabet, unknown medical history other than alcoholism Pat siblings: 67 sisters, 3 brothers, many with high cholesterol, stents including carotid and PAD, many with ischemic heart disease  Has two children, son age 7 and daughter age 59. No known health issues  ROS:   Please see the history of present illness.  (+) Fatigue (+) Pain posterior to right eye. (+) Memory Loss (+) Stress/Anxiety (+) Tightness in right heel and lower posterior RE Additional pertinent ROS negative except as noted in HPI.  EKGs/Labs/Other Studies Reviewed:    The following studies were reviewed today: Zio 2020-09-07 14 days of data recorded on Zio monitor. Patient had a min HR of 58 bpm, max HR of 176 bpm, and avg HR of 90 bpm. Predominant underlying rhythm was Sinus Rhythm. 2 Supraventricular Tachycardia runs occurred, the run with the fastest interval lasting 5 beats with a max rate of 176 bpm, the longest lasting 7 beats with an avg rate of 112 bpm.No VT, atrial fibrillation, high degree block, or pauses noted. Isolated atrial and ventricular ectopy was rare (<1%). There were 23 triggered events. These were largely sinus, some with PVC.  CT coronary 11/24/18 1. Coronary calcium score of 0. This was 0 percentile for age and sex matched control. 2. Normal coronary origin with right dominance. 3. No evidence of CAD.  Stress echo 05/15/15 - Stress ECG conclusions: 1 mm ST segment depressin in lateral   leads during recovery Non diagnostic due to baseline changes.   There were no stress arrhythmias or conduction abnormalities. The   stress ECG was negative for ischemia. - Staged echo: There was no echocardiographic  evidence for   stress-induced ischemia. - Impressions: Low risk study Normal resting and stress echo   images. ECG with baseline changes and abnormal ST depression in   lateral leads in recovery.  Impressions: - Low risk study Normal resting and stress echo images. ECG with   baseline changes and abnormal ST depression in lateral leads in   recovery.   EKG:  EKG is personally reviewed today.   04/07/2021: NSR, rate 100 bpm  10/06/2020: normal sinus rhythm at 96 bpm  Recent Labs: 09/30/2020: ALT 38; BUN 13; Creatinine, Ser 0.95; Hemoglobin 13.7; Platelets 431.0; Potassium 4.3; Sodium 140; TSH 2.67  Recent Lipid Panel    Component Value Date/Time   CHOL 187 04/02/2021 0908   TRIG 182 (H) 04/02/2021 0908   HDL 42 04/02/2021 0908   CHOLHDL 4.5 (H) 04/02/2021 0908   CHOLHDL 5 09/30/2020 0829   VLDL 46.2 (H) 09/30/2020 0829   LDLCALC 113 (H) 04/02/2021 0908   LDLDIRECT 189.0 09/30/2020 8676  Physical Exam:    VS:  BP 132/90 (BP Location: Left Arm, Patient Position: Sitting, Cuff Size: Normal)   Pulse (!) 119   Ht 5' 8.5" (1.74 m)   Wt 208 lb (94.3 kg)   BMI 31.17 kg/m    No data found.  Wt Readings from Last 3 Encounters:  10/07/20 212 lb 5 oz (96.3 kg)  10/06/20 214 lb 9.6 oz (97.3 kg)  08/20/20 217 lb (98.4 kg)    GEN: Well nourished, well developed in no acute distress HEENT: Normal, moist mucous membranes NECK: No JVD CARDIAC: regular rhythm, normal S1 and S2, no rubs or gallops. No murmur. VASCULAR: Radial and DP pulses 2+ bilaterally. No carotid bruits RESPIRATORY:  Clear to auscultation without rales, wheezing or rhonchi  ABDOMEN: Soft, non-tender, non-distended MUSCULOSKELETAL:  Ambulates independently SKIN: Warm and dry, no edema NEUROLOGIC:  Alert and oriented x 3. No focal neuro deficits noted. PSYCHIATRIC:  Normal affect   ASSESSMENT:    1. Mixed hyperlipidemia   2. Tachycardia   3. Family history of heart disease   4. Essential hypertension    5. Counseling on health promotion and disease prevention    PLAN:    Mixed hyperlipidemia, with hypercholesterolemia (severe) and hypertriglyceridemia: Likely heterozygous familial hypercholesterolemia -children should be screened with lipid panel, and discussed option of genetic testing. She has discussed this with her children -coronary CTA without evidence of established CAD -initiated PCSK9 inhibitor 09/2018, prior direct LDL 264 not on statin -has not tolerated statin in the past due to memory issues.  -counseled on both nutrition and exercise in addition to PCSK9 inhibitor.  -last LDL 04/02/21 113, TG 182  Hypertension: goal <130/80. Typically at goal at home and elevated in office -continue amlodipine, spironolactone, losartan -diet and exercise, as noted  Tachycardia Palpitations -Zio monitor as above, no significant arrhythmias -sinus tachycardia today on presentation (HR 120), NSR on ECG (100 bpm)  Prevention and health promotion counseling: family history of cardiovascular disease/high cholesterol as additional risk factor -recommend heart healthy/Mediterranean diet, with whole grains, fruits, vegetable, fish, lean meats, nuts, and olive oil. Limit salt. -recommend moderate walking, 3-5 times/week for 30-50 minutes each session. Aim for at least 150 minutes.week. Goal should be pace of 3 miles/hours, or walking 1.5 miles in 30 minutes -recommend avoidance of tobacco products. Avoid excess alcohol. -Weight: BMI 31, class 1 obesity.  -A1c 6.0, abnormal glucose tolerance  Plan for follow up: 6 mos or sooner as needed.   Buford Dresser, MD, PhD [place]  [facility]   Medication Adjustments/Labs and Tests Ordered: Current medicines are reviewed at length with the patient today.  Concerns regarding medicines are outlined above.  Orders Placed This Encounter  Procedures  . EKG 12-Lead   No orders of the defined types were placed in this  encounter.   Patient Instructions  Medication Instructions:  Your Physician recommend you continue on your current medication as directed.    *If you need a refill on your cardiac medications before your next appointment, please call your pharmacy*   Lab Work: None ordered today   Testing/Procedures: None ordered today   Follow-Up: At [facility], you and your health needs are our priority.  As part of our continuing mission to provide you with exceptional heart care, we have created designated Provider Care Teams.  These Care Teams include your primary Cardiologist (physician) and Advanced Practice Providers (APPs -  Physician Assistants and Nurse Practitioners) who all work together to provide you with  the care you need, when you need it.  We recommend signing up for the patient portal called "MyChart".  Sign up information is provided on this After Visit Summary.  MyChart is used to connect with patients for Virtual Visits (Telemedicine).  Patients are able to view lab/test results, encounter notes, upcoming appointments, etc.  Non-urgent messages can be sent to your provider as well.   To learn more about what you can do with MyChart, go to [email or web address].    Your next appointment:   6 month(s) @ [address] [place], [place] [address]   The format for your next appointment:   In Person  Provider:   Buford Dresser, MD       [facility],acting as a scribe for Buford Dresser, MD.,have documented all relevant documentation on the behalf of Buford Dresser, MD,as directed by  Buford Dresser, MD while in the presence of Buford Dresser, MD.  I, Buford Dresser, MD, have reviewed all documentation for this visit. The documentation on 04/14/21 for the exam, diagnosis, procedures, and orders are all accurate and complete.  Signed, Buford Dresser, MD PhD 04/07/2021  [facility]

## 2021-04-15 ENCOUNTER — Other Ambulatory Visit: Payer: Self-pay | Admitting: Family Medicine

## 2021-04-15 MED ORDER — CLONAZEPAM 0.5 MG PO TABS: 0.5000 mg | ORAL_TABLET | Freq: Every day | ORAL | 0 refills | Status: DC | PRN

## 2021-04-15 NOTE — Telephone Encounter (Signed)
Name of Medication: [place] Name of Pharmacy: [facility] or Written Date and Quantity: 08/20/19 #30 tabs with 0 refills  Last Office Visit and Type: CPE on 10/07/20 Next Office Visit and Type: CPE on 10/12/21 Last Controlled Substance Agreement Date: n/a Last UDS:n/a

## 2021-05-04 ENCOUNTER — Other Ambulatory Visit: Payer: Self-pay | Admitting: Cardiology

## 2021-05-04 DIAGNOSIS — I10 Essential (primary) hypertension: Secondary | ICD-10-CM

## 2021-05-05 NOTE — Telephone Encounter (Signed)
Rx has been sent to the pharmacy electronically. ° °

## 2021-08-24 ENCOUNTER — Telehealth (HOSPITAL_BASED_OUTPATIENT_CLINIC_OR_DEPARTMENT_OTHER): Payer: Self-pay | Admitting: Cardiology

## 2021-08-24 NOTE — Telephone Encounter (Signed)
Received fax from [facility]--Notice of expiring Prior Auth for Rep[place] Push Inj 420/3.5.  New PA submitted in CoverMyMeds today.  Veronica [name] (Key: [id number]) Repatha Pushtronex System 420MG/3.5ML on-body infusor   Form [facility] Electronic Prior Authorization Form (2017 NCPDP)  Created 34 minutes ago  Sent to Plan 12 minutes ago  Plan Response 11 minutes ago  Submit Clinical Questions less than a minute ago  Determination Wait for Determination Please wait for [facility] 2017 NCPDP to return a determination.

## 2021-08-25 NOTE — Telephone Encounter (Signed)
Pt has been approved for Repatha Push Inj 420/3.5 form 08/24/21-08/24/22.

## 2021-08-27 ENCOUNTER — Telehealth: Payer: Self-pay | Admitting: Cardiology

## 2021-08-27 DIAGNOSIS — Z79899 Other long term (current) drug therapy: Secondary | ICD-10-CM

## 2021-08-27 NOTE — Telephone Encounter (Signed)
Patient has upcoming appt with Dr. Harrell Gave in December, she would like for the lab order to be placed and mailed to her.

## 2021-08-31 NOTE — Telephone Encounter (Signed)
She will need lipids and a BMET. Thanks.

## 2021-09-02 NOTE — Telephone Encounter (Signed)
Pt updated and verbalized understanding. Lab orders placed.

## 2021-09-25 ENCOUNTER — Other Ambulatory Visit: Payer: Self-pay | Admitting: Pharmacist

## 2021-09-25 ENCOUNTER — Encounter (HOSPITAL_BASED_OUTPATIENT_CLINIC_OR_DEPARTMENT_OTHER): Payer: Self-pay

## 2021-09-25 MED ORDER — REPATHA PUSHTRONEX SYSTEM 420 MG/3.5ML ~~LOC~~ SOCT: SUBCUTANEOUS | 11 refills | Status: DC

## 2021-09-28 ENCOUNTER — Other Ambulatory Visit: Payer: Self-pay | Admitting: Cardiology

## 2021-10-04 ENCOUNTER — Telehealth: Payer: Self-pay | Admitting: Family Medicine

## 2021-10-04 DIAGNOSIS — I10 Essential (primary) hypertension: Secondary | ICD-10-CM

## 2021-10-04 DIAGNOSIS — R7303 Prediabetes: Secondary | ICD-10-CM

## 2021-10-04 DIAGNOSIS — E7800 Pure hypercholesterolemia, unspecified: Secondary | ICD-10-CM

## 2021-10-04 NOTE — Telephone Encounter (Signed)
-----   Message from Ellamae Sia sent at 09/22/2021 11:38 AM EST ----- Regarding: Lab orders for Monday, 11.28.22 Patient is scheduled for CPX labs, please order future labs, Thanks , Karna Christmas

## 2021-10-05 ENCOUNTER — Other Ambulatory Visit (INDEPENDENT_AMBULATORY_CARE_PROVIDER_SITE_OTHER): Payer: 59

## 2021-10-05 ENCOUNTER — Other Ambulatory Visit: Payer: Self-pay

## 2021-10-05 DIAGNOSIS — I10 Essential (primary) hypertension: Secondary | ICD-10-CM | POA: Diagnosis not present

## 2021-10-05 DIAGNOSIS — R7303 Prediabetes: Secondary | ICD-10-CM | POA: Diagnosis not present

## 2021-10-05 DIAGNOSIS — E7800 Pure hypercholesterolemia, unspecified: Secondary | ICD-10-CM | POA: Diagnosis not present

## 2021-10-05 LAB — CBC WITH DIFFERENTIAL/PLATELET
Basophils Absolute: 0 10*3/uL (ref 0.0–0.1)
Basophils Relative: 0.5 % (ref 0.0–3.0)
Eosinophils Absolute: 0.1 10*3/uL (ref 0.0–0.7)
Eosinophils Relative: 1.6 % (ref 0.0–5.0)
HCT: 40.6 % (ref 36.0–46.0)
Hemoglobin: 13.5 g/dL (ref 12.0–15.0)
Lymphocytes Relative: 28 % (ref 12.0–46.0)
Lymphs Abs: 2 10*3/uL (ref 0.7–4.0)
MCHC: 33.3 g/dL (ref 30.0–36.0)
MCV: 85.7 fl (ref 78.0–100.0)
Monocytes Absolute: 0.5 10*3/uL (ref 0.1–1.0)
Monocytes Relative: 6.4 % (ref 3.0–12.0)
Neutro Abs: 4.6 10*3/uL (ref 1.4–7.7)
Neutrophils Relative %: 63.5 % (ref 43.0–77.0)
Platelets: 403 10*3/uL — ABNORMAL HIGH (ref 150.0–400.0)
RBC: 4.74 Mil/uL (ref 3.87–5.11)
RDW: 13.6 % (ref 11.5–15.5)
WBC: 7.3 10*3/uL (ref 4.0–10.5)

## 2021-10-05 LAB — LIPID PANEL
Cholesterol: 272 mg/dL — ABNORMAL HIGH (ref 0–200)
HDL: 48.3 mg/dL (ref >39.00)
NonHDL: 223.78
Total CHOL/HDL Ratio: 6
Triglycerides: 265 mg/dL — ABNORMAL HIGH (ref 0.0–149.0)
VLDL: 53 mg/dL — ABNORMAL HIGH (ref 0.0–40.0)

## 2021-10-05 LAB — COMPREHENSIVE METABOLIC PANEL
ALT: 35 U/L (ref 0–35)
AST: 18 U/L (ref 0–37)
Albumin: 4.4 g/dL (ref 3.5–5.2)
Alkaline Phosphatase: 150 U/L — ABNORMAL HIGH (ref 39–117)
BUN: 13 mg/dL (ref 6–23)
CO2: 27 mEq/L (ref 19–32)
Calcium: 9.8 mg/dL (ref 8.4–10.5)
Chloride: 103 mEq/L (ref 96–112)
Creatinine, Ser: 0.98 mg/dL (ref 0.40–1.20)
GFR: 63.51 mL/min (ref >60.00)
Glucose, Bld: 124 mg/dL — ABNORMAL HIGH (ref 70–99)
Potassium: 3.9 mEq/L (ref 3.5–5.1)
Sodium: 140 mEq/L (ref 135–145)
Total Bilirubin: 0.5 mg/dL (ref 0.2–1.2)
Total Protein: 7.1 g/dL (ref 6.0–8.3)

## 2021-10-05 LAB — TSH: TSH: 3.46 u[IU]/mL (ref 0.35–5.50)

## 2021-10-05 LAB — HEMOGLOBIN A1C: Hgb A1c MFr Bld: 6 % (ref 4.6–6.5)

## 2021-10-05 LAB — LDL CHOLESTEROL, DIRECT: Direct LDL: 183 mg/dL

## 2021-10-12 ENCOUNTER — Ambulatory Visit (INDEPENDENT_AMBULATORY_CARE_PROVIDER_SITE_OTHER): Payer: 59 | Admitting: Family Medicine

## 2021-10-12 ENCOUNTER — Encounter: Payer: Self-pay | Admitting: Family Medicine

## 2021-10-12 ENCOUNTER — Other Ambulatory Visit: Payer: Self-pay

## 2021-10-12 VITALS — BP 126/78 | HR 107 | Temp 98.0°F | Ht 68.5 in | Wt 210.0 lb

## 2021-10-12 DIAGNOSIS — I10 Essential (primary) hypertension: Secondary | ICD-10-CM | POA: Diagnosis not present

## 2021-10-12 DIAGNOSIS — F341 Dysthymic disorder: Secondary | ICD-10-CM

## 2021-10-12 DIAGNOSIS — E6609 Other obesity due to excess calories: Secondary | ICD-10-CM

## 2021-10-12 DIAGNOSIS — E7800 Pure hypercholesterolemia, unspecified: Secondary | ICD-10-CM

## 2021-10-12 DIAGNOSIS — Z800 Family history of malignant neoplasm of digestive organs: Secondary | ICD-10-CM

## 2021-10-12 DIAGNOSIS — R000 Tachycardia, unspecified: Secondary | ICD-10-CM

## 2021-10-12 DIAGNOSIS — R221 Localized swelling, mass and lump, neck: Secondary | ICD-10-CM | POA: Insufficient documentation

## 2021-10-12 DIAGNOSIS — Z0000 Encounter for general adult medical examination without abnormal findings: Secondary | ICD-10-CM

## 2021-10-12 DIAGNOSIS — Z6831 Body mass index (BMI) 31.0-31.9, adult: Secondary | ICD-10-CM

## 2021-10-12 DIAGNOSIS — R7303 Prediabetes: Secondary | ICD-10-CM

## 2021-10-12 MED ORDER — SPIRONOLACTONE 25 MG PO TABS: ORAL_TABLET | ORAL | 3 refills | Status: DC

## 2021-10-12 MED ORDER — SERTRALINE HCL 50 MG PO TABS: 50.0000 mg | ORAL_TABLET | Freq: Every day | ORAL | 3 refills | Status: DC

## 2021-10-12 MED ORDER — LOSARTAN POTASSIUM 100 MG PO TABS: 100.0000 mg | ORAL_TABLET | Freq: Every day | ORAL | 3 refills | Status: DC

## 2021-10-12 NOTE — Assessment & Plan Note (Signed)
Reviewed stressors/ coping techniques/symptoms/ support sources/ tx options and side effects in detail today Stable with zolofr 50 mg daily and klonopin prn  Enc good self care  Enc her to add exercise

## 2021-10-12 NOTE — Assessment & Plan Note (Signed)
bp in fair control at this time  BP Readings from Last 1 Encounters:  10/12/21 126/78   No changes needed Most recent labs reviewed  Disc lifstyle change with low sodium diet and exercise  Plan to continue  Amlodipine 5 mg daily  Losartan 100 mg daily  Spironolactone 25 mg daily

## 2021-10-12 NOTE — Assessment & Plan Note (Signed)
Up to date with colonoscopy 2021

## 2021-10-12 NOTE — Patient Instructions (Addendum)
If you are interested in the shingles vaccine series (Shingrix), call your insurance or pharmacy to check on coverage and location it must be given.  If affordable - you can schedule it here or at your pharmacy depending on coverage   Think about fitting in some exercise  Take care of yourself   Get your mammogram  After that call us and we may consider ordering an ultrasound of the area above your clavicle   Please call the location of your choice from the menu below to schedule your Mammogram and/or Bone Density appointment.    [facility]                      Phone:  [phone], [place] [address]                                                             Services: Traditional and 3D Mammogram, [place] [facility]                 Phone: [phone] [address], [place] [address]    Service: Bone Density ONLY   *this site does NOT perform mammograms  [place]                        Phone:  [phone] [address], [place] [address]                                            Services:  3D Mammogram and [place] at [facility]   Phone:  [phone]   [address] [place], [place] [address]  Services: 3D Mammogram and Bone Density  [place] at [facility])  Phone:  [phone]   [address] [place], [place] [address]                                              Services:  3D Mammogram and Bone Density

## 2021-10-12 NOTE — Assessment & Plan Note (Signed)
From anx in office  Followed by cardiology

## 2021-10-12 NOTE — Assessment & Plan Note (Signed)
Reviewed health habits including diet and exercise and skin cancer prevention Reviewed appropriate screening tests for age  Also reviewed health mt list, fam hx and immunization status , as well as social and family history    See HPI Labs reviewed  Not yet interested in shingrix  Had flu shot  Mammogram due-strongly enc to get this  Declines pap/gyn exam  Colonoscopy utd

## 2021-10-12 NOTE — Assessment & Plan Note (Signed)
Stable Lab Results  Component Value Date   HGBA1C 6.0 10/05/2021   Pt may be interested in GLP med in the futuer disc imp of low glycemic diet and wt loss to prevent DM2

## 2021-10-12 NOTE — Progress Notes (Signed)
Subjective:    Patient ID: Veronica [name], female    DOB: [birth year], 60 y.o.   MRN: [id number]  This visit occurred during the SARS-CoV-2 public health emergency.  Safety protocols were in place, including screening questions prior to the visit, additional usage of staff PPE, and extensive cleaning of exam room while observing appropriate contact time as indicated for disinfecting solutions.   HPI Here for health maintenance exam and to review chronic medical problems   Wt Readings from Last 3 Encounters:  10/12/21 210 lb (95.3 kg)  04/07/21 208 lb (94.3 kg)  10/07/20 212 lb 5 oz (96.3 kg)   31.47 kg/m  Doing ok overall  Taking care of herself  Would like to loose some weight -very frustrated   Does not exercise  Work hours changed  Getting home later and later  Knows she needs to push herself to exercise   For a while she stopped snacking at night   Zoster status - not interested in shingrix  Flu shot given in october Covid vaccinated Tdap 11/2011  Mammogram 07/2017- it really hurts  Goes to the breast center Self breast exam -no M  Mother had breast cancer   She has occ pain above her L clavicle for years  Feels a little lump above clavicle  Brother was dx with gastric lymphoma  He is an alcoholic   Pap 53/2992  Declines a pap  No menses-menopausal   Colonoscopy 12/2019, up to date  ([facility])  Pt had hemicolectomy for mass and chron's in 2010 Her mother had colon cancer    HTN  bp is stable today  No cp or palpitations or headaches or edema  No side effects to medicines  BP Readings from Last 3 Encounters:  10/12/21 126/78  04/07/21 132/90  10/07/20 122/76     Amlodipine 5 mg daily  Losartan 100 mg daily  Spironolactone 25 mg daily   Lab Results  Component Value Date   CREATININE 0.98 10/05/2021   BUN 13 10/05/2021   NA 140 10/05/2021   K 3.9 10/05/2021   CL 103 10/05/2021   CO2 27 10/05/2021     Pulse Readings from Last 3 Encounters:   10/12/21 (!) 107  04/07/21 (!) 119  10/07/20 99  Her HR is fine at home  Anxiety/driving makes it go up coming here  Cardiology watches this    Hyperlipidemia Lab Results  Component Value Date   CHOL 272 (H) 10/05/2021   CHOL 187 04/02/2021   CHOL 260 (H) 09/30/2020   Lab Results  Component Value Date   HDL 48.30 10/05/2021   HDL 42 04/02/2021   HDL 47.30 09/30/2020   Lab Results  Component Value Date   LDLCALC 113 (H) 04/02/2021   LDLCALC 103 (H) 07/02/2020   LDLCALC 100 (H) 12/17/2019   Lab Results  Component Value Date   TRIG 265.0 (H) 10/05/2021   TRIG 182 (H) 04/02/2021   TRIG 231.0 (H) 09/30/2020   Lab Results  Component Value Date   CHOLHDL 6 10/05/2021   CHOLHDL 4.5 (H) 04/02/2021   CHOLHDL 5 09/30/2020   Lab Results  Component Value Date   LDLDIRECT 183.0 10/05/2021   LDLDIRECT 189.0 09/30/2020   LDLDIRECT 71.0 09/10/2019   Takes repatha  She was due for her shot that day-had not had it yet  Sees cardiology    H/o anxiety and depression  Zoloft 50 mg daily  Klonopin prn  H/o elevated alk phos and ALT in  the past  Stable today  She avoids etoh and tylenol   Lab Results  Component Value Date   ALT 35 10/05/2021   AST 18 10/05/2021   ALKPHOS 150 (H) 10/05/2021   BILITOT 0.5 10/05/2021     Prediabetes Lab Results  Component Value Date   HGBA1C 6.0 10/05/2021  This is stable  Other labs Lab Results  Component Value Date   WBC 7.3 10/05/2021   HGB 13.5 10/05/2021   HCT 40.6 10/05/2021   MCV 85.7 10/05/2021   PLT 403.0 (H) 10/05/2021   Lab Results  Component Value Date   TSH 3.46 10/05/2021    Patient Active Problem List   Diagnosis Date Noted   Lump in neck 10/12/2021   Elevated alkaline phosphatase level 10/07/2020   Family history of colon cancer 09/19/2019   Family history of cerebral aneurysm 09/01/2016   Obesity 09/01/2016   Prediabetes 08/31/2016   Family history of early CAD 05/07/2015   Palpitations 03/11/2015    Encounter for routine gynecological examination 03/11/2015   Exercise intolerance 03/11/2015   Tachycardia 07/16/2014   Hyperlipidemia 11/16/2011   Other screening mammogram 11/16/2011   Fibrocystic breast 11/16/2011   Routine general medical examination at a health care facility 11/07/2011   RGN ENTERITIS SMALL INTESTINE W/LG INTESTINE 11/25/2008   Regional enteritis ([place]) 11/25/2008   Essential hypertension 08/22/2008   OTHER ABNORMAL BLOOD CHEMISTRY 08/01/2008   ANXIETY DEPRESSION 06/27/2008   ACNE, MILD 06/27/2008   EDEMA 06/27/2008   Past Medical History:  Diagnosis Date   Acne    Colonic mass 1/10   Crohn's disease ([place])    Depression    Edema    Hyperlipidemia    Hypertension    PMS (premenstrual syndrome)    Past Surgical History:  Procedure Laterality Date   HEMICOLECTOMY  1/10   Social History   Tobacco Use   Smoking status: Never   Smokeless tobacco: Never  Substance Use Topics   Alcohol use: No    Alcohol/week: 0.0 standard drinks   Drug use: No   Family History  Problem Relation Age of Onset   Colon cancer Mother    Breast cancer Mother    Alcohol abuse Brother    Hypertension Paternal Grandfather    Diabetes Paternal Grandfather    Coronary artery disease Paternal Grandfather    Coronary artery disease Maternal Grandfather    Aneurysm Paternal Grandmother        cerebral   Hypertension Paternal Grandmother    Pulmonary embolism Father    Aneurysm Father        cerebral   Alcohol abuse Son    Drug abuse Son    Depression Other        fam hx   Allergies  Allergen Reactions   Hydrochlorothiazide     Dizzy and felt faint   Ace Inhibitors     cough   Crestor [Rosuvastatin Calcium]     Memory    Fluoxetine Hcl     REACTION: urticartia   Tenormin [Atenolol]     fatigue   Venlafaxine     REACTION: weight gain, makes sleepy   Bupropion Hcl     REACTION: tinnitus previously but no other symptoms.  She tolerated it on second trial w/o  return of tinnitus.     Current Outpatient Medications on File Prior to Visit  Medication Sig Dispense Refill   amLODipine (NORVASC) 5 MG tablet TAKE 1 TABLET(5 MG) BY MOUTH DAILY 90 tablet 3  clonazePAM (KLONOPIN) 0.5 MG tablet Take 1 tablet (0.5 mg total) by mouth daily as needed for anxiety. 30 tablet 0   REPATHA PUSHTRONEX SYSTEM 420 MG/3.5ML SOCT INJECT 420 MG INTO THE SKIN EVERY 30 DAYS 3.5 mL 11   No current facility-administered medications on file prior to visit.    Review of Systems  Constitutional:  Negative for activity change, appetite change, fatigue, fever and unexpected weight change.  HENT:  Negative for congestion, ear pain, rhinorrhea, sinus pressure and sore throat.   Eyes:  Negative for pain, redness and visual disturbance.  Respiratory:  Negative for cough, shortness of breath and wheezing.   Cardiovascular:  Negative for chest pain and palpitations.  Gastrointestinal:  Negative for abdominal pain, blood in stool, constipation and diarrhea.  Endocrine: Negative for polydipsia and polyuria.  Genitourinary:  Negative for dysuria, frequency and urgency.       Chronic breast sensitivity and tenderness-she does not tolerate mammograms  Musculoskeletal:  Negative for arthralgias, back pain and myalgias.  Skin:  Negative for pallor and rash.  Allergic/Immunologic: Negative for environmental allergies.  Neurological:  Negative for dizziness, syncope and headaches.  Hematological:  Negative for adenopathy. Does not bruise/bleed easily.  Psychiatric/Behavioral:  Negative for decreased concentration and dysphoric mood. The patient is not nervous/anxious.       Objective:   Physical Exam Constitutional:      General: She is not in acute distress.    Appearance: Normal appearance. She is well-developed. She is obese. She is not ill-appearing or diaphoretic.  HENT:     Head: Normocephalic and atraumatic.     Right Ear: Tympanic membrane, ear canal and external ear normal.      Left Ear: Tympanic membrane, ear canal and external ear normal.     Nose: Nose normal. No congestion.     Mouth/Throat:     Mouth: Mucous membranes are moist.     Pharynx: Oropharynx is clear. No posterior oropharyngeal erythema.  Eyes:     General: No scleral icterus.    Extraocular Movements: Extraocular movements intact.     Conjunctiva/sclera: Conjunctivae normal.     Pupils: Pupils are equal, round, and reactive to light.  Neck:     Thyroid: No thyromegaly.     Vascular: No carotid bruit or JVD.     Comments: Small (less than 1 cm) density palp just above L clavicle Cardiovascular:     Rate and Rhythm: Normal rate and regular rhythm.     Pulses: Normal pulses.     Heart sounds: Normal heart sounds.    No gallop.  Pulmonary:     Effort: Pulmonary effort is normal. No respiratory distress.     Breath sounds: Normal breath sounds. No wheezing.     Comments: Good air exch Chest:     Chest wall: No tenderness.  Abdominal:     General: Bowel sounds are normal. There is no distension or abdominal bruit.     Palpations: Abdomen is soft. There is no mass.     Tenderness: There is no abdominal tenderness.     Hernia: No hernia is present.  Genitourinary:    Comments: Breast exam: No mass, nodules, thickening, tenderness, bulging, retraction, inflamation, nipple discharge or skin changes noted.  No axillary or clavicular LA.     Sensitive breast tissue- has discomfort with palpation in any area  Musculoskeletal:        General: No tenderness. Normal range of motion.     Cervical back: Normal  range of motion and neck supple. No rigidity. No muscular tenderness.     Right lower leg: No edema.     Left lower leg: No edema.     Comments: Soft tissue density palpable just above L clavicle   Lymphadenopathy:     Cervical: No cervical adenopathy.  Skin:    General: Skin is warm and dry.     Coloration: Skin is not pale.     Findings: No erythema or rash.     Comments: Solar  lentigines diffusely   Neurological:     Mental Status: She is alert. Mental status is at baseline.     Cranial Nerves: No cranial nerve deficit.     Motor: No abnormal muscle tone.     Coordination: Coordination normal.     Gait: Gait normal.     Deep Tendon Reflexes: Reflexes are normal and symmetric. Reflexes normal.  Psychiatric:        Mood and Affect: Mood normal.        Cognition and Memory: Cognition and memory normal.          Assessment & Plan:   Problem List Items Addressed This Visit       Cardiovascular and Mediastinum   Essential hypertension    bp in fair control at this time  BP Readings from Last 1 Encounters:  10/12/21 126/78  No changes needed Most recent labs reviewed  Disc lifstyle change with low sodium diet and exercise  Plan to continue  Amlodipine 5 mg daily  Losartan 100 mg daily  Spironolactone 25 mg daily       Relevant Medications   spironolactone (ALDACTONE) 25 MG tablet   losartan (COZAAR) 100 MG tablet     Other   ANXIETY DEPRESSION    Reviewed stressors/ coping techniques/symptoms/ support sources/ tx options and side effects in detail today Stable with zolofr 50 mg daily and klonopin prn  Enc good self care  Enc her to add exercise       Relevant Medications   sertraline (ZOLOFT) 50 MG tablet   Routine general medical examination at a health care facility - Primary    Reviewed health habits including diet and exercise and skin cancer prevention Reviewed appropriate screening tests for age  Also reviewed health mt list, fam hx and immunization status , as well as social and family history    See HPI Labs reviewed  Not yet interested in shingrix  Had flu shot  Mammogram due-strongly enc to get this  Declines pap/gyn exam  Colonoscopy utd        Hyperlipidemia    Disc goals for lipids and reasons to control them Rev last labs with pt Rev low sat fat diet in detail LDL was up this draw because she was late with repatha  shot Usually well controlled Followed by cardiology       Relevant Medications   spironolactone (ALDACTONE) 25 MG tablet   losartan (COZAAR) 100 MG tablet   Tachycardia    From anx in office  Followed by cardiology      Prediabetes    Stable Lab Results  Component Value Date   HGBA1C 6.0 10/05/2021   Pt may be interested in GLP med in the futuer disc imp of low glycemic diet and wt loss to prevent DM2       Obesity    Discussed how this problem influences overall health and the risks it imposes  Reviewed plan for weight loss with  lower calorie diet (via better food choices and also portion control or program like weight watchers) and exercise building up to or more than 30 minutes 5 days per week including some aerobic activity         Family history of colon cancer    Up to date with colonoscopy 2021      Lump in neck    Small dense area just above L clavicle Will re eval after mammogram  Consider [place] once screen mammo done

## 2021-10-12 NOTE — Assessment & Plan Note (Signed)
Small dense area just above L clavicle Will re eval after mammogram  Consider [place] once screen mammo done

## 2021-10-12 NOTE — Assessment & Plan Note (Signed)
Disc goals for lipids and reasons to control them Rev last labs with pt Rev low sat fat diet in detail LDL was up this draw because she was late with repatha shot Usually well controlled Followed by cardiology

## 2021-10-12 NOTE — Assessment & Plan Note (Signed)
Discussed how this problem influences overall health and the risks it imposes  Reviewed plan for weight loss with lower calorie diet (via better food choices and also portion control or program like weight watchers) and exercise building up to or more than 30 minutes 5 days per week including some aerobic activity    

## 2021-10-19 LAB — BASIC METABOLIC PANEL
BUN/Creatinine Ratio: 12 (ref 9–23)
BUN: 11 mg/dL (ref 6–24)
CO2: 25 mmol/L (ref 20–29)
Calcium: 9.9 mg/dL (ref 8.7–10.2)
Chloride: 101 mmol/L (ref 96–106)
Creatinine, Ser: 0.92 mg/dL (ref 0.57–1.00)
Glucose: 124 mg/dL — ABNORMAL HIGH (ref 70–99)
Potassium: 3.9 mmol/L (ref 3.5–5.2)
Sodium: 143 mmol/L (ref 134–144)
eGFR: 72 mL/min/{1.73_m2} (ref >59)

## 2021-10-19 LAB — LIPID PANEL
Chol/HDL Ratio: 3.5 ratio (ref 0.0–4.4)
Cholesterol, Total: 169 mg/dL (ref 100–199)
HDL: 48 mg/dL (ref >39)
LDL Chol Calc (NIH): 88 mg/dL (ref 0–99)
Triglycerides: 197 mg/dL — ABNORMAL HIGH (ref 0–149)
VLDL Cholesterol Cal: 33 mg/dL (ref 5–40)

## 2021-10-21 ENCOUNTER — Ambulatory Visit (HOSPITAL_BASED_OUTPATIENT_CLINIC_OR_DEPARTMENT_OTHER): Payer: 59 | Admitting: Cardiology

## 2021-11-11 ENCOUNTER — Other Ambulatory Visit: Payer: Self-pay | Admitting: Family Medicine

## 2021-11-11 DIAGNOSIS — Z1231 Encounter for screening mammogram for malignant neoplasm of breast: Secondary | ICD-10-CM

## 2021-11-17 ENCOUNTER — Ambulatory Visit
Admission: RE | Admit: 2021-11-17 | Discharge: 2021-11-17 | Disposition: A | Discharge status: Home / Self Care | Payer: 59 | Source: Ambulatory Visit

## 2021-11-17 DIAGNOSIS — Z1231 Encounter for screening mammogram for malignant neoplasm of breast: Secondary | ICD-10-CM

## 2021-11-20 ENCOUNTER — Other Ambulatory Visit: Payer: Self-pay

## 2021-11-20 ENCOUNTER — Ambulatory Visit (HOSPITAL_BASED_OUTPATIENT_CLINIC_OR_DEPARTMENT_OTHER): Payer: 59 | Admitting: Cardiology

## 2021-11-20 ENCOUNTER — Encounter (HOSPITAL_BASED_OUTPATIENT_CLINIC_OR_DEPARTMENT_OTHER): Payer: Self-pay | Admitting: Cardiology

## 2021-11-20 VITALS — BP 113/76 | HR 86 | Ht 68.5 in | Wt 212.0 lb

## 2021-11-20 DIAGNOSIS — E78011 Heterozygous familial hypercholesterolemia (hefh): Secondary | ICD-10-CM

## 2021-11-20 DIAGNOSIS — Z8249 Family history of ischemic heart disease and other diseases of the circulatory system: Secondary | ICD-10-CM

## 2021-11-20 DIAGNOSIS — E7801 Familial hypercholesterolemia: Secondary | ICD-10-CM | POA: Diagnosis not present

## 2021-11-20 DIAGNOSIS — I10 Essential (primary) hypertension: Secondary | ICD-10-CM

## 2021-11-20 DIAGNOSIS — Z7189 Other specified counseling: Secondary | ICD-10-CM

## 2021-11-20 DIAGNOSIS — E782 Mixed hyperlipidemia: Secondary | ICD-10-CM | POA: Diagnosis not present

## 2021-11-20 NOTE — Patient Instructions (Signed)
Medication Instructions:  Your Physician recommend you continue on your current medication as directed.    *If you need a refill on your cardiac medications before your next appointment, please call your pharmacy*   Lab Work: None ordered today   Testing/Procedures: None ordered today   Follow-Up: At [facility], you and your health needs are our priority.  As part of our continuing mission to provide you with exceptional heart care, we have created designated Provider Care Teams.  These Care Teams include your primary Cardiologist (physician) and Advanced Practice Providers (APPs -  Physician Assistants and Nurse Practitioners) who all work together to provide you with the care you need, when you need it.  We recommend signing up for the patient portal called "MyChart".  Sign up information is provided on this After Visit Summary.  MyChart is used to connect with patients for Virtual Visits (Telemedicine).  Patients are able to view lab/test results, encounter notes, upcoming appointments, etc.  Non-urgent messages can be sent to your provider as well.   To learn more about what you can do with MyChart, go to [email or web address].    Your next appointment:   1 year(s)  The format for your next appointment:   In Person  Provider:   Buford Dresser, MD

## 2021-11-20 NOTE — Progress Notes (Signed)
Cardiology Office Note:    Date:  11/20/2021   ID:  Eugenio Hoes Murty, DOB 1961/06/15, MRN [id number]  PCP:  Abner Greenspan, MD  Cardiologist:  Buford Dresser, MD PhD  Referring MD: Abner Greenspan, MD   CC: follow up  History of Present Illness:    Veronica [name] is a 61 y.o. female with a hx of hyperlipidemia, hypertension who is seen in follow up. She was initially seen 10/02/18 as a new consult at the request of [name], [name], MD for the evaluation and management of family history of early CAD with hyperlipidemia. Last LDL was 262.  Cardiac history: see family history below. No personal history of xanthoma. Tried statin in the past (can't remember which one, per chart likely rosuvastatin), had severe memory issues with it. Had fatigue on beta blocker. Has anxiety. Has done fine with anesthesia in the past for procedures. Had a stress test in 2016 (echo stress). No ischemia on imaging. No recent syncope (vagal during teenage years).   Today: Overall, she is feeling good, with no new complaints. At home she does not monitor her blood pressure.  Lately she has been feeling fatigued with little energy, but she attributes this to her work and stress. Of note, she was feeling more energized on her way to work one day, and notes that she had forgotten to take her medication over the weekend.  Additionally, she has a few small bruises on her left LE due to a collision with a wall.  She denies any palpitations, chest pain, or shortness of breath. No lightheadedness, headaches, syncope, orthopnea, PND, lower extremity edema or exertional symptoms.   Past Medical History:  Diagnosis Date   Acne    Colonic mass 1/10   Crohn's disease ([place])    Depression    Edema    Hyperlipidemia    Hypertension    PMS (premenstrual syndrome)     Past Surgical History:  Procedure Laterality Date   HEMICOLECTOMY  1/10    Current Medications: Current Outpatient Medications on File Prior  to Visit  Medication Sig   amLODipine (NORVASC) 5 MG tablet TAKE 1 TABLET(5 MG) BY MOUTH DAILY   clonazePAM (KLONOPIN) 0.5 MG tablet Take 1 tablet (0.5 mg total) by mouth daily as needed for anxiety.   losartan (COZAAR) 100 MG tablet Take 1 tablet (100 mg total) by mouth daily.   [place] 420 MG/3.5ML SOCT INJECT 420 MG INTO THE SKIN EVERY 30 DAYS   sertraline (ZOLOFT) 50 MG tablet Take 1 tablet (50 mg total) by mouth daily.   spironolactone (ALDACTONE) 25 MG tablet TAKE 1/2 TABLETS BY MOUTH DAILY   No current facility-administered medications on file prior to visit.     Allergies:   Hydrochlorothiazide, Ace inhibitors, Crestor [rosuvastatin calcium], Fluoxetine hcl, Tenormin [atenolol], Venlafaxine, and Bupropion hcl   Social History   Tobacco Use   Smoking status: Never   Smokeless tobacco: Never  Substance Use Topics   Alcohol use: No    Alcohol/week: 0.0 standard drinks   Drug use: No     Family History: The patient's family history includes Alcohol abuse in her brother and son; Aneurysm in her father and paternal grandmother; Breast cancer in her mother; Colon cancer in her mother; Coronary artery disease in her maternal grandfather and paternal grandfather; Depression in an other family member; Diabetes in her paternal grandfather; Drug abuse in her son; Hypertension in her paternal grandfather and paternal grandmother; Pulmonary embolism in  her father.   Father: Cholesterol was over 400. Died age 64, ischemic heart disease. Had history of plaque in his legs causing clots. Unclear cause of death, either stroke or aneurysm. Mother: High cholesterol, total 258. On metoprolol only. On no meds for cholesterol [name]: died age 41 of MI [name]: died about age 33, had prior brain aneurysm 11 years prior to her death, died of stroke [name]: polymyalgia and giant cell arteritis, died age 23 of intestinal issues [name]: died of MI at age 75. Brother: 5 years  [place], unknown medical history other than alcoholism Pat siblings: 7 sisters, 3 brothers, many with high cholesterol, stents including carotid and PAD, many with ischemic heart disease  Has two children, son age 13 and daughter age 28. No known health issues  ROS:   Please see the history of present illness.  (+) Fatigue (+) Stress Additional pertinent ROS negative except as noted in HPI.  EKGs/Labs/Other Studies Reviewed:    The following studies were reviewed today:  Zio 08/20/20 14 days of data recorded on Zio monitor. Patient had a min HR of 58 bpm, max HR of 176 bpm, and avg HR of 90 bpm. Predominant underlying rhythm was Sinus Rhythm. 2 Supraventricular Tachycardia runs occurred, the run with the fastest interval lasting 5 beats with a max rate of 176 bpm, the longest lasting 7 beats with an avg rate of 112 bpm.No VT, atrial fibrillation, high degree block, or pauses noted. Isolated atrial and ventricular ectopy was rare (<1%). There were 23 triggered events. These were largely sinus, some with PVC.  CT coronary 11/24/18 1. Coronary calcium score of 0. This was 0 percentile for age and sex matched control.  2. Normal coronary origin with right dominance.  3. No evidence of CAD.  Stress echo 05/15/15 - Stress ECG conclusions: 1 mm ST segment depressin in lateral   leads during recovery Non diagnostic due to baseline changes.   There were no stress arrhythmias or conduction abnormalities. The   stress ECG was negative for ischemia. - Staged echo: There was no echocardiographic evidence for   stress-induced ischemia. - Impressions: Low risk study Normal resting and stress echo   images. ECG with baseline changes and abnormal ST depression in   lateral leads in recovery.   Impressions:  - Low risk study Normal resting and stress echo images. ECG with   baseline changes and abnormal ST depression in lateral leads in   recovery.    EKG:  EKG is personally reviewed today.    11/20/2021: NSR at 86 bpm, nonspecific t wave pattern 04/07/2021: NSR, rate 100 bpm  10/06/2020: normal sinus rhythm at 96 bpm  Recent Labs: 10/05/2021: ALT 35; Hemoglobin 13.5; Platelets 403.0; TSH 3.46 10/19/2021: BUN 11; Creatinine, Ser 0.92; Potassium 3.9; Sodium 143   Recent Lipid Panel    Component Value Date/Time   CHOL 169 10/19/2021 0839   TRIG 197 (H) 10/19/2021 0839   HDL 48 10/19/2021 0839   CHOLHDL 3.5 10/19/2021 0839   CHOLHDL 6 10/05/2021 0802   VLDL 53.0 (H) 10/05/2021 0802   LDLCALC 88 10/19/2021 0839   LDLDIRECT 183.0 10/05/2021 0802    Physical Exam:    VS:  BP 113/76 (BP Location: Left Arm, Patient Position: Sitting, Cuff Size: Large)    Pulse 86    Ht 5' 8.5" (1.74 m)    Wt 212 lb (96.2 kg)    SpO2 96%    BMI 31.77 kg/m  No data found.  Wt Readings from Last 3 Encounters:  11/20/21 212 lb (96.2 kg)  10/12/21 210 lb (95.3 kg)  04/07/21 208 lb (94.3 kg)    GEN: Well nourished, well developed in no acute distress HEENT: Normal, moist mucous membranes NECK: No JVD CARDIAC: regular rhythm, normal S1 and S2, no rubs or gallops. No murmur. VASCULAR: Radial and DP pulses 2+ bilaterally. No carotid bruits RESPIRATORY:  Clear to auscultation without rales, wheezing or rhonchi  ABDOMEN: Soft, non-tender, non-distended MUSCULOSKELETAL:  Ambulates independently SKIN: Warm and dry, no edema. Ecchymosis noted on left UE. Lipoma-like lesion in her left clavicular region. NEUROLOGIC:  Alert and oriented x 3. No focal neuro deficits noted. PSYCHIATRIC:  Normal affect   ASSESSMENT:    1. Heterozygous familial hypercholesterolemia   2. Essential hypertension   3. Mixed hyperlipidemia   4. Family history of heart disease   5. Counseling on health promotion and disease prevention     PLAN:    Mixed hyperlipidemia, with hypercholesterolemia (severe) and hypertriglyceridemia: Likely heterozygous familial hypercholesterolemia -we have discussed cascade screening  and genetic testing -coronary CTA without evidence of established CAD -initiated PCSK9 inhibitor 09/2018, prior direct LDL 264 not on statin -has not tolerated statin in the past due to memory issues.  -counseled on both nutrition and exercise in addition to PCSK9 inhibitor.  -last LDL 10/2021 88  Hypertension: goal <130/80. Typically at goal at home and elevated in office -continue amlodipine, spironolactone, losartan -diet and exercise, as noted  Tachycardia Palpitations -Zio monitor as above, no significant arrhythmias -symptoms largely resolved  Prevention and health promotion counseling: family history of cardiovascular disease/high cholesterol as additional risk factor -recommend heart healthy/Mediterranean diet, with whole grains, fruits, vegetable, fish, lean meats, nuts, and olive oil. Limit salt. -recommend moderate walking, 3-5 times/week for 30-50 minutes each session. Aim for at least 150 minutes.week. Goal should be pace of 3 miles/hours, or walking 1.5 miles in 30 minutes -recommend avoidance of tobacco products. Avoid excess alcohol. -Weight: BMI 31, class 1 obesity.  -A1c 6.0, abnormal glucose tolerance  Plan for follow up: 1 year or sooner as needed.   Buford Dresser, MD, PhD [place]   [facility]   Medication Adjustments/Labs and Tests Ordered: Current medicines are reviewed at length with the patient today.  Concerns regarding medicines are outlined above.   Orders Placed This Encounter  Procedures   EKG 12-Lead   No orders of the defined types were placed in this encounter.  Patient Instructions  Medication Instructions:  Your Physician recommend you continue on your current medication as directed.    *If you need a refill on your cardiac medications before your next appointment, please call your pharmacy*   Lab Work: None ordered today   Testing/Procedures: None ordered today   Follow-Up: At [facility], you and your health  needs are our priority.  As part of our continuing mission to provide you with exceptional heart care, we have created designated Provider Care Teams.  These Care Teams include your primary Cardiologist (physician) and Advanced Practice Providers (APPs -  Physician Assistants and Nurse Practitioners) who all work together to provide you with the care you need, when you need it.  We recommend signing up for the patient portal called "MyChart".  Sign up information is provided on this After Visit Summary.  MyChart is used to connect with patients for Virtual Visits (Telemedicine).  Patients are able to view lab/test results, encounter notes, upcoming appointments, etc.  Non-urgent  messages can be sent to your provider as well.   To learn more about what you can do with MyChart, go to [email or web address].    Your next appointment:   1 year(s)  The format for your next appointment:   In Person  Provider:   Buford Dresser, MD       [facility]ump[facility],acting as a scribe for Buford Dresser, MD.,have documented all relevant documentation on the behalf of Buford Dresser, MD,as directed by  Buford Dresser, MD while in the presence of Buford Dresser, MD.  I, Buford Dresser, MD, have reviewed all documentation for this visit. The documentation on 11/20/21 for the exam, diagnosis, procedures, and orders are all accurate and complete.  Signed, Buford Dresser, MD PhD 11/20/2021  [place]

## 2022-08-01 ENCOUNTER — Other Ambulatory Visit: Payer: Self-pay | Admitting: Cardiology

## 2022-08-01 DIAGNOSIS — I10 Essential (primary) hypertension: Secondary | ICD-10-CM

## 2022-08-02 NOTE — Telephone Encounter (Signed)
Rx request sent to pharmacy.  

## 2022-09-06 ENCOUNTER — Telehealth: Payer: Self-pay | Admitting: Pharmacist

## 2022-09-06 NOTE — Telephone Encounter (Signed)
Approved through 09/07/23

## 2022-09-06 NOTE — Telephone Encounter (Signed)
PA for Repatha submitted Key: [id number]

## 2022-09-28 ENCOUNTER — Other Ambulatory Visit: Payer: Self-pay | Admitting: Family Medicine

## 2022-09-28 MED ORDER — CLONAZEPAM 0.5 MG PO TABS: 0.5000 mg | ORAL_TABLET | Freq: Every day | ORAL | 0 refills | Status: DC | PRN

## 2022-09-28 NOTE — Telephone Encounter (Signed)
Name of Medication: Aneta Name of Pharmacy: [facility] or Written Date and Quantity: 04/15/2021 #30 tabs with 0 refills  Last Office Visit and Type: CPE on 10/12/21 Next Office Visit and Type: CPE on 10/13/22 Last Controlled Substance Agreement Date: n/a Last UDS:n/a

## 2022-10-02 ENCOUNTER — Other Ambulatory Visit: Payer: Self-pay | Admitting: Cardiology

## 2022-10-04 ENCOUNTER — Other Ambulatory Visit: Payer: Self-pay | Admitting: Cardiology

## 2022-10-06 ENCOUNTER — Telehealth: Payer: Self-pay | Admitting: Family Medicine

## 2022-10-06 ENCOUNTER — Other Ambulatory Visit: Payer: 59

## 2022-10-06 DIAGNOSIS — E7800 Pure hypercholesterolemia, unspecified: Secondary | ICD-10-CM

## 2022-10-06 DIAGNOSIS — R7303 Prediabetes: Secondary | ICD-10-CM

## 2022-10-06 DIAGNOSIS — E78011 Heterozygous familial hypercholesterolemia (hefh): Secondary | ICD-10-CM

## 2022-10-06 DIAGNOSIS — E7801 Familial hypercholesterolemia: Secondary | ICD-10-CM

## 2022-10-06 DIAGNOSIS — I10 Essential (primary) hypertension: Secondary | ICD-10-CM

## 2022-10-06 NOTE — Telephone Encounter (Signed)
-----   Message from Velna Hatchet, RT sent at 09/22/2022 11:21 AM EST ----- Regarding: Thur 11/30 lab Patient is scheduled for cpx, please order future labs.  Thanks, Anda Kraft

## 2022-10-07 ENCOUNTER — Other Ambulatory Visit (INDEPENDENT_AMBULATORY_CARE_PROVIDER_SITE_OTHER): Payer: 59

## 2022-10-07 DIAGNOSIS — E7800 Pure hypercholesterolemia, unspecified: Secondary | ICD-10-CM | POA: Diagnosis not present

## 2022-10-07 DIAGNOSIS — I10 Essential (primary) hypertension: Secondary | ICD-10-CM

## 2022-10-07 DIAGNOSIS — R7303 Prediabetes: Secondary | ICD-10-CM | POA: Diagnosis not present

## 2022-10-07 LAB — COMPREHENSIVE METABOLIC PANEL
ALT: 30 U/L (ref 0–35)
AST: 18 U/L (ref 0–37)
Albumin: 4.3 g/dL (ref 3.5–5.2)
Alkaline Phosphatase: 138 U/L — ABNORMAL HIGH (ref 39–117)
BUN: 14 mg/dL (ref 6–23)
CO2: 29 mEq/L (ref 19–32)
Calcium: 9.4 mg/dL (ref 8.4–10.5)
Chloride: 105 mEq/L (ref 96–112)
Creatinine, Ser: 0.93 mg/dL (ref 0.40–1.20)
GFR: 67.15 mL/min (ref >60.00)
Glucose, Bld: 121 mg/dL — ABNORMAL HIGH (ref 70–99)
Potassium: 4.6 mEq/L (ref 3.5–5.1)
Sodium: 142 mEq/L (ref 135–145)
Total Bilirubin: 0.5 mg/dL (ref 0.2–1.2)
Total Protein: 6.9 g/dL (ref 6.0–8.3)

## 2022-10-07 LAB — TSH: TSH: 1.38 u[IU]/mL (ref 0.35–5.50)

## 2022-10-07 LAB — CBC WITH DIFFERENTIAL/PLATELET
Basophils Absolute: 0 10*3/uL (ref 0.0–0.1)
Basophils Relative: 0.6 % (ref 0.0–3.0)
Eosinophils Absolute: 0.1 10*3/uL (ref 0.0–0.7)
Eosinophils Relative: 1.1 % (ref 0.0–5.0)
HCT: 40.6 % (ref 36.0–46.0)
Hemoglobin: 13.7 g/dL (ref 12.0–15.0)
Lymphocytes Relative: 31.4 % (ref 12.0–46.0)
Lymphs Abs: 1.7 10*3/uL (ref 0.7–4.0)
MCHC: 33.8 g/dL (ref 30.0–36.0)
MCV: 85.9 fl (ref 78.0–100.0)
Monocytes Absolute: 0.4 10*3/uL (ref 0.1–1.0)
Monocytes Relative: 8 % (ref 3.0–12.0)
Neutro Abs: 3.3 10*3/uL (ref 1.4–7.7)
Neutrophils Relative %: 58.9 % (ref 43.0–77.0)
Platelets: 383 10*3/uL (ref 150.0–400.0)
RBC: 4.73 Mil/uL (ref 3.87–5.11)
RDW: 13.4 % (ref 11.5–15.5)
WBC: 5.5 10*3/uL (ref 4.0–10.5)

## 2022-10-07 LAB — LIPID PANEL
Cholesterol: 194 mg/dL (ref 0–200)
HDL: 46.1 mg/dL (ref >39.00)
NonHDL: 147.77
Total CHOL/HDL Ratio: 4
Triglycerides: 206 mg/dL — ABNORMAL HIGH (ref 0.0–149.0)
VLDL: 41.2 mg/dL — ABNORMAL HIGH (ref 0.0–40.0)

## 2022-10-07 LAB — HEMOGLOBIN A1C: Hgb A1c MFr Bld: 6.1 % (ref 4.6–6.5)

## 2022-10-07 LAB — LDL CHOLESTEROL, DIRECT: Direct LDL: 124 mg/dL

## 2022-10-13 ENCOUNTER — Encounter: Payer: Self-pay | Admitting: Family Medicine

## 2022-10-13 ENCOUNTER — Ambulatory Visit (INDEPENDENT_AMBULATORY_CARE_PROVIDER_SITE_OTHER): Payer: 59 | Admitting: Family Medicine

## 2022-10-13 VITALS — BP 116/82 | HR 113 | Temp 98.1°F | Ht 68.5 in | Wt 197.8 lb

## 2022-10-13 DIAGNOSIS — I10 Essential (primary) hypertension: Secondary | ICD-10-CM

## 2022-10-13 DIAGNOSIS — Z800 Family history of malignant neoplasm of digestive organs: Secondary | ICD-10-CM

## 2022-10-13 DIAGNOSIS — Z23 Encounter for immunization: Secondary | ICD-10-CM

## 2022-10-13 DIAGNOSIS — F341 Dysthymic disorder: Secondary | ICD-10-CM

## 2022-10-13 DIAGNOSIS — Z0000 Encounter for general adult medical examination without abnormal findings: Secondary | ICD-10-CM

## 2022-10-13 DIAGNOSIS — E7800 Pure hypercholesterolemia, unspecified: Secondary | ICD-10-CM

## 2022-10-13 DIAGNOSIS — R7303 Prediabetes: Secondary | ICD-10-CM

## 2022-10-13 DIAGNOSIS — Z803 Family history of malignant neoplasm of breast: Secondary | ICD-10-CM

## 2022-10-13 MED ORDER — SERTRALINE HCL 50 MG PO TABS: 50.0000 mg | ORAL_TABLET | Freq: Every day | ORAL | 3 refills | Status: DC

## 2022-10-13 MED ORDER — LOSARTAN POTASSIUM 100 MG PO TABS: 100.0000 mg | ORAL_TABLET | Freq: Every day | ORAL | 3 refills | Status: DC

## 2022-10-13 MED ORDER — SPIRONOLACTONE 25 MG PO TABS: ORAL_TABLET | ORAL | 3 refills | Status: DC

## 2022-10-13 NOTE — Progress Notes (Unsigned)
Subjective:    Patient ID: Veronica [name], female    DOB: 1961/09/12, 61 y.o.   MRN: [id number]  HPI Here for health maintenance exam and to review chronic medical problems    Doing ok  Wt Readings from Last 3 Encounters:  10/13/22 197 lb 12.8 oz (89.7 kg)  11/20/21 212 lb (96.2 kg)  10/12/21 210 lb (95.3 kg)   29.64 kg/m  Lost some weight  Cut back on all fast food and stopped sodas Less processed food  Walking more with a friend   Working at home helps  Moving much more   Immunization History  Administered Date(s) Administered   Influenza Split 11/16/2011   Influenza, Quadrivalent, Recombinant, Inj, Pf 08/14/2018   Influenza,inj,Quad PF,6+ Mos 10/29/2013, 08/31/2016, 09/02/2017   Influenza-Unspecified 08/21/2019, 08/20/2020, 08/20/2021   Moderna Sars-Covid-2 Vaccination 04/18/2020, 05/16/2020, 12/18/2020   Td 08/08/2002   Tdap 11/16/2011   Health Maintenance Due  Topic Date Due   Zoster Vaccines- Shingrix (1 of 2) Never done   DTaP/Tdap/Td (3 - Td or Tdap) 11/15/2021   INFLUENZA VACCINE  06/08/2022   COVID-19 Vaccine (4 - 2023-24 season) 07/09/2022    Shingrix- interested   Tdap 12013  due   Flu shot- today   Pap neg 2017  No gyn changes  Declines gyn exam or pap today  No new partners    Mammogram 11/2021  Self breast exam: no lumps  Mother had breast cancer twice   Colonoscopy 12/2019 -had a polyp/ 5 years , [facility]  Has had colon surgery  Mother had colon cancer    Mood H/o anx and depression  Zoloft 50 mg daily    HTN bp is stable today  No cp or palpitations or headaches or edema  No side effects to medicines  BP Readings from Last 3 Encounters:  10/13/22 116/82  11/20/21 113/76  10/12/21 126/78     Amlodipine 5 mg daily -no longer taking / per cardiology  Losartan 100 mg daily  Spironolactone 25 mg daily   Pulse Readings from Last 3 Encounters:  10/13/22 (!) 113  11/20/21 86  10/12/21 (!) 107   Pulse is high- from being  here [place] Per fit bit her pulse ave 70s   Fam h/o cerebral aneurysm  Lab Results  Component Value Date   CREATININE 0.93 10/07/2022   BUN 14 10/07/2022   NA 142 10/07/2022   K 4.6 10/07/2022   CL 105 10/07/2022   CO2 29 10/07/2022   Hyperlipidemia Lab Results  Component Value Date   CHOL 194 10/07/2022   CHOL 169 10/19/2021   CHOL 272 (H) 10/05/2021   Lab Results  Component Value Date   HDL 46.10 10/07/2022   HDL 48 10/19/2021   HDL 48.30 10/05/2021   Lab Results  Component Value Date   LDLCALC 88 10/19/2021   LDLCALC 113 (H) 04/02/2021   LDLCALC 103 (H) 07/02/2020   Lab Results  Component Value Date   TRIG 206.0 (H) 10/07/2022   TRIG 197 (H) 10/19/2021   TRIG 265.0 (H) 10/05/2021   Lab Results  Component Value Date   CHOLHDL 4 10/07/2022   CHOLHDL 3.5 10/19/2021   CHOLHDL 6 10/05/2021   Lab Results  Component Value Date   LDLDIRECT 124.0 10/07/2022   LDLDIRECT 183.0 10/05/2021   LDLDIRECT 189.0 09/30/2020   Repatha  Fam hx of early CAD Sees cardiology  CTA -no CAD   Prediabetes Lab Results  Component Value Date   HGBA1C 6.1  10/07/2022  Up from 6.0  Likes sweets    Other labs Lab Results  Component Value Date   ALT 30 10/07/2022   AST 18 10/07/2022   ALKPHOS 138 (H) 10/07/2022   BILITOT 0.5 10/07/2022   Baseline elevated alk phos  This is down from previous  Lab Results  Component Value Date   WBC 5.5 10/07/2022   HGB 13.7 10/07/2022   HCT 40.6 10/07/2022   MCV 85.9 10/07/2022   PLT 383.0 10/07/2022   Lab Results  Component Value Date   TSH 1.38 10/07/2022    Patient Active Problem List   Diagnosis Date Noted   Heterozygous familial hypercholesterolemia 11/20/2021   Lump in neck 10/12/2021   Elevated alkaline phosphatase level 10/07/2020   Family history of colon cancer 09/19/2019   Family history of cerebral aneurysm 09/01/2016   Obesity 09/01/2016   Prediabetes 08/31/2016   Family history of early CAD 05/07/2015    Palpitations 03/11/2015   Encounter for routine gynecological examination 03/11/2015   Exercise intolerance 03/11/2015   Tachycardia 07/16/2014   Hyperlipidemia 11/16/2011   Other screening mammogram 11/16/2011   Fibrocystic breast 11/16/2011   Routine general medical examination at a health care facility 11/07/2011   RGN ENTERITIS SMALL INTESTINE W/LG INTESTINE 11/25/2008   Regional enteritis ([place]) 11/25/2008   Essential hypertension 08/22/2008   OTHER ABNORMAL BLOOD CHEMISTRY 08/01/2008   ANXIETY DEPRESSION 06/27/2008   ACNE, MILD 06/27/2008   EDEMA 06/27/2008   Past Medical History:  Diagnosis Date   Acne    Colonic mass 1/10   Crohn's disease ([facility])    Depression    Edema    Hyperlipidemia    Hypertension    PMS (premenstrual syndrome)    Past Surgical History:  Procedure Laterality Date   HEMICOLECTOMY  1/10   Social History   Tobacco Use   Smoking status: Never   Smokeless tobacco: Never  Substance Use Topics   Alcohol use: No    Alcohol/week: 0.0 standard drinks of alcohol   Drug use: No   Family History  Problem Relation Age of Onset   Colon cancer Mother    Breast cancer Mother    Alcohol abuse Brother    Hypertension Paternal Grandfather    Diabetes Paternal Grandfather    Coronary artery disease Paternal Grandfather    Coronary artery disease Maternal Grandfather    Aneurysm Paternal Grandmother        cerebral   Hypertension Paternal Grandmother    Pulmonary embolism Father    Aneurysm Father        cerebral   Alcohol abuse Son    Drug abuse Son    Depression Other        fam hx   Allergies  Allergen Reactions   Hydrochlorothiazide     Dizzy and felt faint   Ace Inhibitors     cough   Crestor [Rosuvastatin Calcium]     Memory    Fluoxetine Hcl     REACTION: urticartia   Tenormin [Atenolol]     fatigue   Venlafaxine     REACTION: weight gain, makes sleepy   Bupropion Hcl     REACTION: tinnitus previously but no other symptoms.  She  tolerated it on second trial w/o return of tinnitus.     Current Outpatient Medications on File Prior to Visit  Medication Sig Dispense Refill   clonazePAM (KLONOPIN) 0.5 MG tablet Take 1 tablet (0.5 mg total) by mouth daily as needed for anxiety.  30 tablet 0   Evolocumab with Infusor ([place]) 420 MG/3.5ML SOCT INJECT 420 MG( 3.5 ML) UNDER THE SKIN EVERY 30 DAYS. 3.5 mL 11   No current facility-administered medications on file prior to visit.      Review of Systems  Constitutional:  Positive for fatigue. Negative for activity change, appetite change, fever and unexpected weight change.  HENT:  Negative for congestion, ear pain, rhinorrhea, sinus pressure and sore throat.   Eyes:  Negative for pain, redness and visual disturbance.  Respiratory:  Negative for cough, shortness of breath and wheezing.   Cardiovascular:  Negative for chest pain and palpitations.  Gastrointestinal:  Negative for abdominal pain, blood in stool, constipation and diarrhea.  Endocrine: Negative for polydipsia and polyuria.  Genitourinary:  Negative for dysuria, frequency and urgency.  Musculoskeletal:  Negative for arthralgias, back pain and myalgias.  Skin:  Negative for pallor and rash.  Allergic/Immunologic: Negative for environmental allergies.  Neurological:  Negative for dizziness, syncope and headaches.  Hematological:  Negative for adenopathy. Does not bruise/bleed easily.  Psychiatric/Behavioral:  Negative for decreased concentration and dysphoric mood. The patient is nervous/anxious.        Objective:   Physical Exam Constitutional:      General: She is not in acute distress.    Appearance: Normal appearance. She is well-developed. She is obese. She is not ill-appearing or diaphoretic.  HENT:     Head: Normocephalic and atraumatic.     Right Ear: Tympanic membrane, ear canal and external ear normal.     Left Ear: Tympanic membrane, ear canal and external ear normal.     Nose:  Nose normal. No congestion.     Mouth/Throat:     Mouth: Mucous membranes are moist.     Pharynx: Oropharynx is clear. No posterior oropharyngeal erythema.  Eyes:     General: No scleral icterus.    Extraocular Movements: Extraocular movements intact.     Conjunctiva/sclera: Conjunctivae normal.     Pupils: Pupils are equal, round, and reactive to light.  Neck:     Thyroid: No thyromegaly.     Vascular: No carotid bruit or JVD.  Cardiovascular:     Rate and Rhythm: Normal rate and regular rhythm.     Pulses: Normal pulses.     Heart sounds: Normal heart sounds.     No gallop.  Pulmonary:     Effort: Pulmonary effort is normal. No respiratory distress.     Breath sounds: Normal breath sounds. No wheezing.     Comments: Good air exch Chest:     Chest wall: No tenderness.  Abdominal:     General: Bowel sounds are normal. There is no distension or abdominal bruit.     Palpations: Abdomen is soft. There is no mass.     Tenderness: There is no abdominal tenderness.     Hernia: No hernia is present.  Genitourinary:    Comments: Breast exam: No mass, nodules, thickening, tenderness, bulging, retraction, inflamation, nipple discharge or skin changes noted.  No axillary or clavicular LA.     Musculoskeletal:        General: No tenderness. Normal range of motion.     Cervical back: Normal range of motion and neck supple. No rigidity. No muscular tenderness.     Right lower leg: No edema.     Left lower leg: No edema.     Comments: No kyphosis   Lymphadenopathy:     Cervical: No cervical adenopathy.  Skin:  General: Skin is warm and dry.     Coloration: Skin is not pale.     Findings: No erythema or rash.     Comments: Solar lentigines diffusely   Neurological:     Mental Status: She is alert. Mental status is at baseline.     Cranial Nerves: No cranial nerve deficit.     Motor: No abnormal muscle tone.     Coordination: Coordination normal.     Gait: Gait normal.     Deep  Tendon Reflexes: Reflexes normal.  Psychiatric:        Mood and Affect: Mood normal.        Cognition and Memory: Cognition and memory normal.           Assessment & Plan:   Problem List Items Addressed This Visit       Cardiovascular and Mediastinum   Essential hypertension    bp in fair control at this time  BP Readings from Last 1 Encounters:  10/13/22 116/82  No changes needed Most recent labs reviewed  Disc lifstyle change with low sodium diet and exercise  Losartan 100 mg daily  Spironolactone 25 mg daily  Off amlodipine and bp is still ok      Relevant Medications   losartan (COZAAR) 100 MG tablet   spironolactone (ALDACTONE) 25 MG tablet     Other   ANXIETY DEPRESSION    Continues sertraline 50 mg daily   Stable        Relevant Medications   sertraline (ZOLOFT) 50 MG tablet   Family history of breast cancer    Mother had breast cancer twice  2 diff kinds        Relevant Orders   Ambulatory referral to Genetics   Family history of colon cancer    Colonoscopy 12/2019 with polyp 5 year recall  [facility]       Relevant Orders   Ambulatory referral to Genetics   Hyperlipidemia    Disc goals for lipids and reasons to control them Rev last labs with pt Rev low sat fat diet in detail  Repatha  Early fam h/o CAD  No CAD on CTA      Relevant Medications   losartan (COZAAR) 100 MG tablet   spironolactone (ALDACTONE) 25 MG tablet   Prediabetes    Lab Results  Component Value Date   HGBA1C 6.1 10/07/2022  disc imp of low glycemic diet and wt loss to prevent DM2       Routine general medical examination at a health care facility - Primary    Reviewed health habits including diet and exercise and skin cancer prevention Reviewed appropriate screening tests for age  Also reviewed health mt list, fam hx and immunization status , as well as social and family history   See HPI Labs reviewed  Plans to check on shingrix coverage Flu and tetanus shots  given Declines gyn exam or pap Mammogram due in January-pt will call to schedule       Other Visit Diagnoses     Need for vaccine for Td (tetanus-diphtheria)       Relevant Orders   Td : Tetanus/diphtheria >7yo Preservative  free (Completed)   Need for immunization against influenza       Relevant Orders   Flu Vaccine QUAD 99moIM (Fluarix, Fluzone & Alfiuria Quad PF) (Completed)

## 2022-10-13 NOTE — Patient Instructions (Addendum)
If you are interested in the shingles vaccine series (Shingrix), call your insurance or pharmacy to check on coverage and location it must be given.  If affordable - you can schedule it here or at your pharmacy depending on coverage   Flu shot and tetanus shot due today   Your mammogram is due in in [place] ahead and schedule it at the breast center   I want to refer you to genetics if we can due to strong cancer history in family  Check with your insurance about coverage   Try to get most of your carbohydrates from produce (with the exception of white potatoes)  Eat less bread/pasta/rice/snack foods/cereals/sweets and other items from the middle of the grocery store (processed carbs)

## 2022-10-14 DIAGNOSIS — Z803 Family history of malignant neoplasm of breast: Secondary | ICD-10-CM | POA: Insufficient documentation

## 2022-10-14 NOTE — Assessment & Plan Note (Signed)
Mother had breast cancer twice  2 diff kinds

## 2022-10-14 NOTE — Assessment & Plan Note (Signed)
Colonoscopy 12/2019 with polyp 5 year recall  [facility]

## 2022-10-14 NOTE — Assessment & Plan Note (Signed)
Reviewed health habits including diet and exercise and skin cancer prevention Reviewed appropriate screening tests for age  Also reviewed health mt list, fam hx and immunization status , as well as social and family history   See HPI Labs reviewed  Plans to check on shingrix coverage Flu and tetanus shots given Declines gyn exam or pap Mammogram due in January-pt will call to schedule

## 2022-10-14 NOTE — Assessment & Plan Note (Signed)
bp in fair control at this time  BP Readings from Last 1 Encounters:  10/13/22 116/82   No changes needed Most recent labs reviewed  Disc lifstyle change with low sodium diet and exercise  Losartan 100 mg daily  Spironolactone 25 mg daily  Off amlodipine and bp is still ok

## 2022-10-14 NOTE — Assessment & Plan Note (Signed)
Continues sertraline 50 mg daily   Stable

## 2022-10-14 NOTE — Assessment & Plan Note (Signed)
Lab Results  Component Value Date   HGBA1C 6.1 10/07/2022   disc imp of low glycemic diet and wt loss to prevent DM2

## 2022-10-14 NOTE — Assessment & Plan Note (Signed)
Disc goals for lipids and reasons to control them Rev last labs with pt Rev low sat fat diet in detail  Repatha  Early fam h/o CAD  No CAD on CTA

## 2022-10-28 ENCOUNTER — Inpatient Hospital Stay: Payer: 59 | Admitting: Licensed Clinical Social Worker

## 2022-10-28 ENCOUNTER — Inpatient Hospital Stay: Payer: 59

## 2023-03-14 ENCOUNTER — Other Ambulatory Visit: Payer: Self-pay

## 2023-03-14 ENCOUNTER — Other Ambulatory Visit (HOSPITAL_BASED_OUTPATIENT_CLINIC_OR_DEPARTMENT_OTHER): Payer: Self-pay

## 2023-03-14 MED ORDER — REPATHA PUSHTRONEX SYSTEM 420 MG/3.5ML ~~LOC~~ SOCT: SUBCUTANEOUS | 11 refills | Status: DC

## 2023-03-14 NOTE — Telephone Encounter (Signed)
Rx refill requested, rx to pharm

## 2023-03-16 ENCOUNTER — Encounter (HOSPITAL_BASED_OUTPATIENT_CLINIC_OR_DEPARTMENT_OTHER): Payer: Self-pay

## 2023-03-16 DIAGNOSIS — I10 Essential (primary) hypertension: Secondary | ICD-10-CM

## 2023-03-16 DIAGNOSIS — E782 Mixed hyperlipidemia: Secondary | ICD-10-CM

## 2023-03-16 DIAGNOSIS — Z79899 Other long term (current) drug therapy: Secondary | ICD-10-CM

## 2023-03-17 MED ORDER — REPATHA SURECLICK 140 MG/ML ~~LOC~~ SOAJ: 140.0000 mg | SUBCUTANEOUS | 1 refills | Status: DC

## 2023-03-17 NOTE — Telephone Encounter (Signed)
Reached out to [employer] D  They are discontinuing and replacing with Repatha 140 mg every 2 weeks Dr Cristal Deer aware and would like to get repeat labs in 3 months

## 2023-03-18 NOTE — Telephone Encounter (Signed)
Discussed 5/9, pushtronix being discontinued

## 2023-05-02 ENCOUNTER — Ambulatory Visit: Payer: 59 | Admitting: Family Medicine

## 2023-05-02 ENCOUNTER — Encounter: Payer: Self-pay | Admitting: Family Medicine

## 2023-05-02 VITALS — BP 145/90 | HR 81 | Temp 97.8°F | Ht 68.5 in | Wt 201.5 lb

## 2023-05-02 DIAGNOSIS — I10 Essential (primary) hypertension: Secondary | ICD-10-CM | POA: Diagnosis not present

## 2023-05-02 DIAGNOSIS — F341 Dysthymic disorder: Secondary | ICD-10-CM | POA: Diagnosis not present

## 2023-05-02 DIAGNOSIS — B029 Zoster without complications: Secondary | ICD-10-CM | POA: Insufficient documentation

## 2023-05-02 MED ORDER — TRAMADOL HCL 50 MG PO TABS: 50.0000 mg | ORAL_TABLET | Freq: Three times a day (TID) | ORAL | 0 refills | Status: AC | PRN

## 2023-05-02 MED ORDER — VALACYCLOVIR HCL 1 G PO TABS: 1000.0000 mg | ORAL_TABLET | Freq: Three times a day (TID) | ORAL | 0 refills | Status: DC

## 2023-05-02 NOTE — Assessment & Plan Note (Signed)
Has been doing TMS treatment  (depression worsened in December) Doing much better

## 2023-05-02 NOTE — Assessment & Plan Note (Addendum)
In L4 dermatome primarily with mod to severe pain and mild rash Prescription for valtrex 1 g tid Tramadol for pain(discussed side effects)  Monitor closely  Consider gabapentin if needed  Discussed skin care/see AVS Er precautions noted  Handouts given on shingles and tramadol

## 2023-05-02 NOTE — Progress Notes (Signed)
Subjective:    Patient ID: Veronica [name], female    DOB: August 27, 1961, 62 y.o.   MRN: [id number]  HPI  Wt Readings from Last 3 Encounters:  05/02/23 201 lb 8 oz (91.4 kg)  10/13/22 197 lb 12.8 oz (89.7 kg)  11/20/21 212 lb (96.2 kg)   30.19 kg/m  Vitals:   05/02/23 1157 05/02/23 1214  BP: (!) 148/94 (!) 145/90  Pulse: 81   Temp: 97.8 F (36.6 C)   SpO2: 98%     Pt presents for rash - suspicious for shingles  Started as a pain from left hip down to the foot   (started last month- father's day) Excruciating at times   Rash started Saturday -not severe   Very painful- especially on lateral left hip   Takes tylenol 2 Ibuprofen 800 mg  Alternating Heat and ice  Showers      Has not had shingrix yet but was planning to get is     Patient Active Problem List   Diagnosis Date Noted   Shingles 05/02/2023   Family history of breast cancer 10/14/2022   Heterozygous familial hypercholesterolemia 11/20/2021   Lump in neck 10/12/2021   Elevated alkaline phosphatase level 10/07/2020   Family history of colon cancer 09/19/2019   Family history of cerebral aneurysm 09/01/2016   Obesity 09/01/2016   Prediabetes 08/31/2016   Family history of early CAD 05/07/2015   Palpitations 03/11/2015   Encounter for routine gynecological examination 03/11/2015   Exercise intolerance 03/11/2015   Tachycardia 07/16/2014   Hyperlipidemia 11/16/2011   Other screening mammogram 11/16/2011   Fibrocystic breast 11/16/2011   Routine general medical examination at a health care facility 11/07/2011   RGN ENTERITIS SMALL INTESTINE W/LG INTESTINE 11/25/2008   Regional enteritis (HCC) 11/25/2008   Essential hypertension 08/22/2008   OTHER ABNORMAL BLOOD CHEMISTRY 08/01/2008   ANXIETY DEPRESSION 06/27/2008   ACNE, MILD 06/27/2008   EDEMA 06/27/2008   Past Medical History:  Diagnosis Date   Acne    Colonic mass 1/10   Crohn's disease (HCC)    Depression    Edema     Hyperlipidemia    Hypertension    PMS (premenstrual syndrome)    Past Surgical History:  Procedure Laterality Date   HEMICOLECTOMY  1/10   Social History   Tobacco Use   Smoking status: Never   Smokeless tobacco: Never  Substance Use Topics   Alcohol use: No    Alcohol/week: 0.0 standard drinks of alcohol   Drug use: No   Family History  Problem Relation Age of Onset   Colon cancer Mother    Breast cancer Mother    Alcohol abuse Brother    Hypertension Paternal Grandfather    Diabetes Paternal Grandfather    Coronary artery disease Paternal Grandfather    Coronary artery disease Maternal Grandfather    Aneurysm Paternal Grandmother        cerebral   Hypertension Paternal Grandmother    Pulmonary embolism Father    Aneurysm Father        cerebral   Alcohol abuse Son    Drug abuse Son    Depression Other        fam hx   Allergies  Allergen Reactions   Hydrochlorothiazide     Dizzy and felt faint   Ace Inhibitors     cough   Crestor [Rosuvastatin Calcium]     Memory    Fluoxetine Hcl     REACTION: urticartia   Tenormin [  Atenolol]     fatigue   Venlafaxine     REACTION: weight gain, makes sleepy   Bupropion Hcl     REACTION: tinnitus previously but no other symptoms.  She tolerated it on second trial w/o return of tinnitus.     Current Outpatient Medications on File Prior to Visit  Medication Sig Dispense Refill   clonazePAM (KLONOPIN) 0.5 MG tablet Take 1 tablet (0.5 mg total) by mouth daily as needed for anxiety. 30 tablet 0   Evolocumab (REPATHA SURECLICK) 140 MG/ML SOAJ Inject 140 mg into the skin every 14 (fourteen) days. 6 mL 1   losartan (COZAAR) 100 MG tablet Take 1 tablet (100 mg total) by mouth daily. 90 tablet 3   sertraline (ZOLOFT) 50 MG tablet Take 1 tablet (50 mg total) by mouth daily. 90 tablet 3   spironolactone (ALDACTONE) 25 MG tablet TAKE 1/2 TABLETS BY MOUTH DAILY 45 tablet 3   No current facility-administered medications on file prior  to visit.    Review of Systems  Constitutional:  Negative for activity change, appetite change, fatigue, fever and unexpected weight change.  HENT:  Negative for congestion, ear pain, rhinorrhea, sinus pressure and sore throat.   Eyes:  Negative for pain, redness and visual disturbance.  Respiratory:  Negative for cough, shortness of breath and wheezing.   Cardiovascular:  Negative for chest pain and palpitations.  Gastrointestinal:  Negative for abdominal pain, blood in stool, constipation and diarrhea.  Endocrine: Negative for polydipsia and polyuria.  Genitourinary:  Negative for dysuria, frequency and urgency.  Musculoskeletal:  Negative for arthralgias, back pain and myalgias.  Skin:  Positive for rash. Negative for pallor.       Pain in left leg rash  Allergic/Immunologic: Negative for environmental allergies.  Neurological:  Negative for dizziness, syncope, facial asymmetry and headaches.  Hematological:  Negative for adenopathy. Does not bruise/bleed easily.  Psychiatric/Behavioral:  Negative for decreased concentration and dysphoric mood. The patient is not nervous/anxious.        Depression improved with TMS treatment       Objective:   Physical Exam        Assessment & Plan:   Problem List Items Addressed This Visit       Cardiovascular and Mediastinum   Essential hypertension    Blood pressure is up due to pain today  Will continue to watch this Losartan 100 mg daily  BP: (!) 145/90          Nervous and Auditory   Shingles - Primary    In L4 dermatome primarily with mod to severe pain and mild rash Prescription for valtrex 1 g tid Tramadol for pain(discussed side effects)  Monitor closely  Consider gabapentin if needed  Discussed skin care/see AVS Er precautions noted       Relevant Medications   valACYclovir (VALTREX) 1000 MG tablet     Other   ANXIETY DEPRESSION    Has been doing TMS treatment  (depression worsened in December) Doing much  better

## 2023-05-02 NOTE — Patient Instructions (Addendum)
Protect your rash area from friction  Keep it clean gently   Take the generic valtrex as directed  Tramadol for pain as needed   If not improving or worse let us know

## 2023-05-02 NOTE — Assessment & Plan Note (Signed)
Blood pressure is up due to pain today  Will continue to watch this Losartan 100 mg daily  BP: (!) 145/90

## 2023-05-04 ENCOUNTER — Telehealth: Payer: Self-pay | Admitting: Family Medicine

## 2023-05-04 MED ORDER — GABAPENTIN 100 MG PO CAPS: 100.0000 mg | ORAL_CAPSULE | Freq: Three times a day (TID) | ORAL | 1 refills | Status: DC

## 2023-05-04 NOTE — Telephone Encounter (Signed)
Pt notified Rx sent to pharmacy and advise of Dr. Royden Purl comments. Pt will keep [place] posted

## 2023-05-04 NOTE — Telephone Encounter (Signed)
Pt called in stating she has been nauseous since starting the meds, traMADol (ULTRAM) 50 MG tablet. Pt states the meds were prescribed for her shingles. Pt asked is there any other meds she can take to replace the tramadol? Call back # [phone]

## 2023-05-04 NOTE — Telephone Encounter (Signed)
Hold tramadol (if not taking with food can try that but bet she has   Start gabapentin one pill am and pm -then if well tolerated three times daily  May sedate initially and make her feel off balance so use caution  I sent to pharmacy  Once adjusted to that let me know and we can gradually increase the dose to help pain   It is a process  This is for both acute shingles pain and post herpetic neuropathy   Keep me posted/ update next week

## 2023-05-04 NOTE — Addendum Note (Signed)
Addended by: Roxy Manns A on: 05/04/2023 01:04 PM   Modules accepted: Orders

## 2023-06-02 ENCOUNTER — Encounter (HOSPITAL_BASED_OUTPATIENT_CLINIC_OR_DEPARTMENT_OTHER): Payer: Self-pay

## 2023-06-10 ENCOUNTER — Ambulatory Visit (INDEPENDENT_AMBULATORY_CARE_PROVIDER_SITE_OTHER): Payer: 59 | Admitting: Cardiology

## 2023-06-10 ENCOUNTER — Encounter (HOSPITAL_BASED_OUTPATIENT_CLINIC_OR_DEPARTMENT_OTHER): Payer: Self-pay | Admitting: Cardiology

## 2023-06-10 VITALS — BP 134/88 | HR 84 | Ht 68.5 in | Wt 201.2 lb

## 2023-06-10 DIAGNOSIS — Z8249 Family history of ischemic heart disease and other diseases of the circulatory system: Secondary | ICD-10-CM

## 2023-06-10 DIAGNOSIS — E782 Mixed hyperlipidemia: Secondary | ICD-10-CM

## 2023-06-10 DIAGNOSIS — I10 Essential (primary) hypertension: Secondary | ICD-10-CM | POA: Diagnosis not present

## 2023-06-10 DIAGNOSIS — Z7189 Other specified counseling: Secondary | ICD-10-CM

## 2023-06-10 DIAGNOSIS — E7801 Familial hypercholesterolemia: Secondary | ICD-10-CM

## 2023-06-10 NOTE — Progress Notes (Signed)
Cardiology Office Note:  .    Date:  06/10/2023  ID:  Veronica [name], DOB [birth year], MRN [id number] PCP: Judy Pimple, MD  [facility] Providers Cardiologist:  Jodelle Red, MD     History of Present Illness: .    Veronica [name] is a 62 y.o. female with a hx of hyperlipidemia, hypertension, who presents for follow up today. She was initially seen 10/02/18 as a new consult at the request of [name], Audrie Gallus, MD for the evaluation and management of family history of early CAD with hyperlipidemia. Last LDL was 262.   Cardiac history: see family history below. No personal history of xanthoma. Tried statin in the past (can't remember which one, per chart likely rosuvastatin), had severe memory issues with it. Had fatigue on beta blocker. Has anxiety. Has done fine with anesthesia in the past for procedures. Had a stress test in 2016 (echo stress). No ischemia on imaging. No recent syncope (vagal during teenage years).    At her visit 11/2021, she was feeling well overall. Complained of fatigue attributable to her work and stress.   Today, she reports that she developed Shingles at her left sciatic nerve in June, lasting 3-4 weeks. Initially had vomiting on tramadol, also experienced gastric pain on Valtrex. No recurring pain at this time.  In the office her blood pressure is 138/94 initially, and 134/88 on manual recheck. She doesn't monitor her blood pressures but checks her pulse at home. Resting heart rate 75 bpm at night according to her Fit Bit; 90-105 bpm at work. Has a lot of anxiety. Palpitations/heart racing symptoms seem to be stable.  Additionally reports that she has cut back on her Zoloft, which she does from time to time. Has also completed a transcranial magnetic stimulation treatment for depression.   Recently after standing for about an hour, she suddenly felt diaphoretic and pale. Stopped on the way home and drank a soda, felt better. Discussed vasovagal  responses today.   She also wished to discuss her weight today. She goes for walks for exercise but may begin to feel fatigued after 5 minutes.  She denies any chest pain, shortness of breath, peripheral edema, lightheadedness, headaches, syncope, orthopnea, or PND.  ROS:  Please see the history of present illness. ROS otherwise negative except as noted.   Studies Reviewed: Marland Kitchen    EKG Interpretation Date/Time:  Friday June 10 2023 15:00:59 EDT Ventricular Rate:  84 PR Interval:  144 QRS Duration:  70 QT Interval:  342 QTC Calculation: 404 R Axis:   17  Text Interpretation: Normal sinus rhythm Nonspecific T wave abnormality When compared with ECG of 05-Nov-2008 08:50, No significant change was found Confirmed by Jodelle Red [phone]) on 06/10/2023 3:19:11 PM     Physical Exam:    VS:  BP 134/88 (BP Location: Right Arm, Patient Position: Sitting, Cuff Size: Large)   Pulse 84   Ht 5' 8.5" (1.74 m)   Wt 201 lb 3.4 oz (91.3 kg)   BMI 30.15 kg/m    Wt Readings from Last 3 Encounters:  06/10/23 201 lb 3.4 oz (91.3 kg)  05/02/23 201 lb 8 oz (91.4 kg)  10/13/22 197 lb 12.8 oz (89.7 kg)    GEN: Well nourished, well developed in no acute distress HEENT: Normal, moist mucous membranes NECK: No JVD CARDIAC: regular rhythm, normal S1 and S2, no rubs or gallops. No murmur. VASCULAR: Radial and DP pulses 2+ bilaterally. No carotid bruits RESPIRATORY:  Clear to  auscultation without rales, wheezing or rhonchi  ABDOMEN: Soft, non-tender, non-distended MUSCULOSKELETAL:  Ambulates independently SKIN: Warm and dry, no edema NEUROLOGIC:  Alert and oriented x 3. No focal neuro deficits noted. PSYCHIATRIC:  Normal affect   ASSESSMENT AND PLAN: .    Mixed hyperlipidemia, with hypercholesterolemia (severe) and hypertriglyceridemia: Likely heterozygous familial hypercholesterolemia -we have discussed cascade screening and genetic testing -coronary CTA without evidence of established  CAD -initiated PCSK9 inhibitor 09/2018, prior direct LDL 264 not on statin -has not tolerated statin in the past due to memory issues.  -counseled on both nutrition and exercise in addition to PCSK9 inhibitor.  -LDL 10/2021 88, most recently 138   Hypertension: goal <130/80. Typically at goal at home and elevated in office -continue amlodipine, spironolactone, losartan -diet and exercise, as noted   Tachycardia Palpitations -Zio monitor as above, no significant arrhythmias -symptoms largely resolved   Prevention and health promotion counseling: family history of cardiovascular disease/high cholesterol as additional risk factor -recommend heart healthy/Mediterranean diet, with whole grains, fruits, vegetable, fish, lean meats, nuts, and olive oil. Limit salt. -recommend moderate walking, 3-5 times/week for 30-50 minutes each session. Aim for at least 150 minutes.week. Goal should be pace of 3 miles/hours, or walking 1.5 miles in 30 minutes -recommend avoidance of tobacco products. Avoid excess alcohol.  Dispo: Follow-up in 1 year, or sooner as needed.  I,Mathew Stumpf,acting as a Neurosurgeon for [employer], MD.,have documented all relevant documentation on the behalf of Jodelle Red, MD,as directed by  Jodelle Red, MD while in the presence of Jodelle Red, MD.  I, Jodelle Red, MD, have reviewed all documentation for this visit. The documentation on 07/27/23 for the exam, diagnosis, procedures, and orders are all accurate and complete.   Signed, Jodelle Red, MD

## 2023-06-10 NOTE — Patient Instructions (Signed)
Medication Instructions:  The current medical regimen is effective;  continue present plan and medications.  *If you need a refill on your cardiac medications before your next appointment, please call your pharmacy*   Lab Work: None  Testing/Procedures: None   Follow-Up: At [facility], you and your health needs are our priority.  As part of our continuing mission to provide you with exceptional heart care, we have created designated Provider Care Teams.  These Care Teams include your primary Cardiologist (physician) and Advanced Practice Providers (APPs -  Physician Assistants and Nurse Practitioners) who all work together to provide you with the care you need, when you need it.  We recommend signing up for the patient portal called "MyChart".  Sign up information is provided on this After Visit Summary.  MyChart is used to connect with patients for Virtual Visits (Telemedicine).  Patients are able to view lab/test results, encounter notes, upcoming appointments, etc.  Non-urgent messages can be sent to your provider as well.   To learn more about what you can do with MyChart, go to [email or web address].    Your next appointment:   1 year(s)  Provider:   Buford Dresser, MD    Other Instructions None

## 2023-07-27 ENCOUNTER — Encounter (HOSPITAL_BASED_OUTPATIENT_CLINIC_OR_DEPARTMENT_OTHER): Payer: Self-pay | Admitting: Cardiology

## 2023-08-25 ENCOUNTER — Other Ambulatory Visit (HOSPITAL_BASED_OUTPATIENT_CLINIC_OR_DEPARTMENT_OTHER): Payer: Self-pay | Admitting: Cardiology

## 2023-11-18 ENCOUNTER — Other Ambulatory Visit (HOSPITAL_COMMUNITY): Payer: Self-pay

## 2023-11-18 ENCOUNTER — Telehealth: Payer: Self-pay | Admitting: Pharmacist Clinician (PhC)/ Clinical Pharmacy Specialist

## 2023-11-18 ENCOUNTER — Telehealth: Payer: Self-pay | Admitting: Pharmacy Technician

## 2023-11-18 NOTE — Telephone Encounter (Signed)
 Repatha PA needs to be updated per pharmacy  Key:  [facility]

## 2023-11-18 NOTE — Telephone Encounter (Signed)
 Pharmacy Patient Advocate Encounter   Received notification from Pt Calls Messages that prior authorization for repatha  is required/requested.   Insurance verification completed.   The patient is insured through [employer] .   Per test claim: PA required; PA submitted to above mentioned insurance via CoverMyMeds Key/confirmation #/EOC [id number] Status is pending

## 2023-11-21 ENCOUNTER — Other Ambulatory Visit (HOSPITAL_COMMUNITY): Payer: Self-pay

## 2023-11-21 NOTE — Telephone Encounter (Signed)
 Pharmacy Patient Advocate Encounter  Received notification from [facility] that Prior Authorization for repatha  has been APPROVED from 11/21/23 to 11/16/24. Ran test claim, Copay is $60.00 one month. This test claim was processed through [facility]- copay amounts may vary at other pharmacies due to pharmacy/plan contracts, or as the patient moves through the different stages of their insurance plan.   PA #/Case ID/Reference #: [id number]

## 2023-11-24 MED ORDER — REPATHA SURECLICK 140 MG/ML ~~LOC~~ SOAJ: 140.0000 mg | SUBCUTANEOUS | 3 refills | Status: DC

## 2023-11-24 NOTE — Addendum Note (Signed)
Addended by: Rosalee Kaufman on: 11/24/2023 10:20 AM   Modules accepted: Orders

## 2024-01-04 ENCOUNTER — Other Ambulatory Visit: Payer: Self-pay | Admitting: Family Medicine

## 2024-01-04 NOTE — Telephone Encounter (Signed)
 Call pt and schedule a appt for cpe / labs

## 2024-01-04 NOTE — Telephone Encounter (Signed)
 Last CPE was 10/13/22, please schedule CPE (labs prior) and route back to me. Thanks

## 2024-01-22 ENCOUNTER — Telehealth: Payer: Self-pay | Admitting: Family Medicine

## 2024-01-22 DIAGNOSIS — E7800 Pure hypercholesterolemia, unspecified: Secondary | ICD-10-CM

## 2024-01-22 DIAGNOSIS — R7303 Prediabetes: Secondary | ICD-10-CM

## 2024-01-22 DIAGNOSIS — I10 Essential (primary) hypertension: Secondary | ICD-10-CM

## 2024-01-22 NOTE — Telephone Encounter (Signed)
-----   Message from Alvina Chou sent at 01/09/2024  2:23 PM EST ----- Regarding: Lab orders for Tue, 3.18.25 Patient is scheduled for CPX labs, please order future labs, Thanks , Camelia Eng

## 2024-01-24 ENCOUNTER — Other Ambulatory Visit (INDEPENDENT_AMBULATORY_CARE_PROVIDER_SITE_OTHER): Payer: 59

## 2024-01-24 DIAGNOSIS — R7303 Prediabetes: Secondary | ICD-10-CM | POA: Diagnosis not present

## 2024-01-24 DIAGNOSIS — I10 Essential (primary) hypertension: Secondary | ICD-10-CM | POA: Diagnosis not present

## 2024-01-24 DIAGNOSIS — E7800 Pure hypercholesterolemia, unspecified: Secondary | ICD-10-CM

## 2024-01-24 LAB — CBC WITH DIFFERENTIAL/PLATELET
Basophils Absolute: 0 10*3/uL (ref 0.0–0.1)
Basophils Relative: 0.6 % (ref 0.0–3.0)
Eosinophils Absolute: 0.1 10*3/uL (ref 0.0–0.7)
Eosinophils Relative: 1.7 % (ref 0.0–5.0)
HCT: 41.3 % (ref 36.0–46.0)
Hemoglobin: 13.7 g/dL (ref 12.0–15.0)
Lymphocytes Relative: 36.1 % (ref 12.0–46.0)
Lymphs Abs: 2.5 10*3/uL (ref 0.7–4.0)
MCHC: 33.2 g/dL (ref 30.0–36.0)
MCV: 87.5 fl (ref 78.0–100.0)
Monocytes Absolute: 0.6 10*3/uL (ref 0.1–1.0)
Monocytes Relative: 8 % (ref 3.0–12.0)
Neutro Abs: 3.7 10*3/uL (ref 1.4–7.7)
Neutrophils Relative %: 53.6 % (ref 43.0–77.0)
Platelets: 385 10*3/uL (ref 150.0–400.0)
RBC: 4.72 Mil/uL (ref 3.87–5.11)
RDW: 13.4 % (ref 11.5–15.5)
WBC: 7 10*3/uL (ref 4.0–10.5)

## 2024-01-24 LAB — COMPREHENSIVE METABOLIC PANEL
ALT: 51 U/L — ABNORMAL HIGH (ref 0–35)
AST: 26 U/L (ref 0–37)
Albumin: 4.4 g/dL (ref 3.5–5.2)
Alkaline Phosphatase: 160 U/L — ABNORMAL HIGH (ref 39–117)
BUN: 12 mg/dL (ref 6–23)
CO2: 27 meq/L (ref 19–32)
Calcium: 9.8 mg/dL (ref 8.4–10.5)
Chloride: 105 meq/L (ref 96–112)
Creatinine, Ser: 0.88 mg/dL (ref 0.40–1.20)
GFR: 71.1 mL/min (ref >60.00)
Glucose, Bld: 124 mg/dL — ABNORMAL HIGH (ref 70–99)
Potassium: 4.4 meq/L (ref 3.5–5.1)
Sodium: 143 meq/L (ref 135–145)
Total Bilirubin: 0.6 mg/dL (ref 0.2–1.2)
Total Protein: 7.1 g/dL (ref 6.0–8.3)

## 2024-01-24 LAB — LIPID PANEL
Cholesterol: 183 mg/dL (ref 0–200)
HDL: 44.2 mg/dL (ref >39.00)
LDL Cholesterol: 95 mg/dL (ref 0–99)
NonHDL: 138.68
Total CHOL/HDL Ratio: 4
Triglycerides: 216 mg/dL — ABNORMAL HIGH (ref 0.0–149.0)
VLDL: 43.2 mg/dL — ABNORMAL HIGH (ref 0.0–40.0)

## 2024-01-24 LAB — TSH: TSH: 2.93 u[IU]/mL (ref 0.35–5.50)

## 2024-01-24 LAB — HEMOGLOBIN A1C: Hgb A1c MFr Bld: 6.1 % (ref 4.6–6.5)

## 2024-01-31 ENCOUNTER — Ambulatory Visit (INDEPENDENT_AMBULATORY_CARE_PROVIDER_SITE_OTHER): Payer: 59 | Admitting: Family Medicine

## 2024-01-31 ENCOUNTER — Encounter: Payer: Self-pay | Admitting: Family Medicine

## 2024-01-31 VITALS — BP 139/88 | HR 114 | Temp 98.5°F | Ht 68.25 in | Wt 204.4 lb

## 2024-01-31 DIAGNOSIS — R7303 Prediabetes: Secondary | ICD-10-CM

## 2024-01-31 DIAGNOSIS — Z0000 Encounter for general adult medical examination without abnormal findings: Secondary | ICD-10-CM

## 2024-01-31 DIAGNOSIS — I10 Essential (primary) hypertension: Secondary | ICD-10-CM

## 2024-01-31 DIAGNOSIS — R748 Abnormal levels of other serum enzymes: Secondary | ICD-10-CM

## 2024-01-31 DIAGNOSIS — Z6830 Body mass index (BMI) 30.0-30.9, adult: Secondary | ICD-10-CM

## 2024-01-31 DIAGNOSIS — F341 Dysthymic disorder: Secondary | ICD-10-CM

## 2024-01-31 DIAGNOSIS — R7401 Elevation of levels of liver transaminase levels: Secondary | ICD-10-CM

## 2024-01-31 DIAGNOSIS — E7800 Pure hypercholesterolemia, unspecified: Secondary | ICD-10-CM

## 2024-01-31 DIAGNOSIS — E66811 Obesity, class 1: Secondary | ICD-10-CM

## 2024-01-31 DIAGNOSIS — E6609 Other obesity due to excess calories: Secondary | ICD-10-CM

## 2024-01-31 DIAGNOSIS — R000 Tachycardia, unspecified: Secondary | ICD-10-CM

## 2024-01-31 MED ORDER — CLONAZEPAM 0.5 MG PO TABS: 0.5000 mg | ORAL_TABLET | Freq: Every day | ORAL | 0 refills | Status: DC | PRN

## 2024-01-31 NOTE — Assessment & Plan Note (Signed)
 Discussed how this problem influences overall health and the risks it imposes  Reviewed plan for weight loss with lower calorie diet (via better food choices (lower glycemic and portion control) along with exercise building up to or more than 30 minutes 5 days per week including some aerobic activity and strength training

## 2024-01-31 NOTE — Progress Notes (Signed)
 Subjective:    Patient ID: Veronica [name], female    DOB: 01/25/1961, 63 y.o.   MRN: [id number]  HPI  Here for health maintenance exam and to review chronic medical problems   Wt Readings from Last 3 Encounters:  01/31/24 204 lb 6 oz (92.7 kg)  06/10/23 201 lb 3.4 oz (91.3 kg)  05/02/23 201 lb 8 oz (91.4 kg)   30.85 kg/m  Vitals:   01/31/24 0925 01/31/24 1005  BP: (!) 142/74 139/88  Pulse: (!) 114   Temp: 98.5 F (36.9 C)   SpO2: 96%     Immunization History  Administered Date(s) Administered   Influenza Split 11/16/2011   Influenza, Quadrivalent, Recombinant, Inj, Pf 08/14/2018   Influenza,inj,Quad PF,6+ Mos 10/29/2013, 08/31/2016, 09/02/2017, 10/13/2022   Influenza-Unspecified 08/21/2019, 08/20/2020, 08/20/2021   Moderna Sars-Covid-2 Vaccination 04/18/2020, 05/16/2020, 12/18/2020   Td 08/08/2002, 10/13/2022   Tdap 11/16/2011    Health Maintenance Due  Topic Date Due   MAMMOGRAM  11/18/2023   Working  Same as usual  Her DIL is going to establish here - moved from [place] Thrilled to have them here    Shingrix -plans to check on coverage  Had shingles last June   Mammogram 08/2022  -pt wants to schedule this  Mother had breast cancer 2 times/ 2 different types  Self breast exam no lumps   Gyn health Declines pap/gyn exam   Colon cancer screening  Colonoscopy 12/2019 with 5 y recall   Bone health   Falls- occational due to vertigo - fell forward opening dog food bag / no injuries  Fractures-none  Supplements -none   Exercise  Tries to walk regularly  Lack of time and motivation  Has option for VIR exercise program     Mood    01/31/2024    9:31 AM 05/02/2023   12:00 PM 10/13/2022    8:36 AM 10/12/2021    9:43 AM 10/07/2020    4:20 PM  Depression screen PHQ 2/9  Decreased Interest 0 1 0 1 1  Down, Depressed, Hopeless 1 0 0 1 0  PHQ - 2 Score 1 1 0 2 1  Altered sleeping 1 0  1 1  Tired, decreased energy 1 1  2 1   Change in appetite 1 0  1  0  Feeling bad or failure about yourself  0 0  1 0  Trouble concentrating 0 0  0 0  Moving slowly or fidgety/restless 0 0  0 0  Suicidal thoughts 0 0  0 0  PHQ-9 Score 4 2  7 3   Difficult doing work/chores Not difficult at all Not difficult at all  Somewhat difficult Not difficult at all   Did TMS treatment last summer (transcranial magnetic stimulation)  Clonazepam 0.5 mg prn  Stopped zoloft - that made her un motivated  May return for more TMS   Has Calm app for meditation- was good at it and then let it go     HTN bp is stable today  No cp or palpitations or headaches or edema  No side effects to medicines  BP Readings from Last 3 Encounters:  01/31/24 139/88  06/10/23 134/88  05/02/23 (!) 145/90    Losartan 100 mg daily  Aldactone 12.5 mg daily   Forgot her medicine for 2 days-then took this am   Tachycardia Pulse Readings from Last 3 Encounters:  01/31/24 (!) 114  06/10/23 84  05/02/23 81   Followed by cardiology  Gets tachycardia with  anxiety  Due for a monitor   Tracks pulse on fit bit  Lowest in 70s At work can go up to 200 - thinks this stress related      Lab Results  Component Value Date   NA 143 01/24/2024   K 4.4 01/24/2024   CO2 27 01/24/2024   GLUCOSE 124 (H) 01/24/2024   BUN 12 01/24/2024   CREATININE 0.88 01/24/2024   CALCIUM 9.8 01/24/2024   GFR 71.10 01/24/2024   EGFR 72 10/19/2021   GFRNONAA 76 11/20/2018   Lab Results  Component Value Date   ALT 51 (H) 01/24/2024   AST 26 01/24/2024   ALKPHOS 160 (H) 01/24/2024   BILITOT 0.6 01/24/2024   Has history of elevated ALT and alk phos in the past  ? Fatty acetaminophen   Occational tylenol  No alcohol    GF had cirrhosis    Hyperlipidemia Lab Results  Component Value Date   CHOL 183 01/24/2024   CHOL 215 (H) 06/06/2023   CHOL 194 10/07/2022   Lab Results  Component Value Date   HDL 44.20 01/24/2024   HDL 45 06/06/2023   HDL 46.10 10/07/2022   Lab Results   Component Value Date   LDLCALC 95 01/24/2024   LDLCALC 138 (H) 06/06/2023   LDLCALC 88 10/19/2021   Lab Results  Component Value Date   TRIG 216.0 (H) 01/24/2024   TRIG 178 (H) 06/06/2023   TRIG 206.0 (H) 10/07/2022   Lab Results  Component Value Date   CHOLHDL 4 01/24/2024   CHOLHDL 4.8 (H) 06/06/2023   CHOLHDL 4 10/07/2022   Lab Results  Component Value Date   LDLDIRECT 124.0 10/07/2022   LDLDIRECT 183.0 10/05/2021   LDLDIRECT 189.0 09/30/2020   Taking repatha  Has seen cardiology No CAD on CTA as well  Has fam history of early CAD  Prediabetes Lab Results  Component Value Date   HGBA1C 6.1 01/24/2024   HGBA1C 6.1 10/07/2022   HGBA1C 6.0 10/05/2021     Patient Active Problem List   Diagnosis Date Noted   Family history of breast cancer 10/14/2022   Heterozygous familial hypercholesterolemia 11/20/2021   Lump in neck 10/12/2021   Elevated alkaline phosphatase level 10/07/2020   Family history of colon cancer 09/19/2019   Family history of cerebral aneurysm 09/01/2016   Obesity 09/01/2016   Prediabetes 08/31/2016   Family history of early CAD 05/07/2015   Palpitations 03/11/2015   Encounter for routine gynecological examination 03/11/2015   Exercise intolerance 03/11/2015   Tachycardia 07/16/2014   Hyperlipidemia 11/16/2011   Fibrocystic breast 11/16/2011   Routine general medical examination at a health care facility 11/07/2011   RGN ENTERITIS SMALL INTESTINE W/LG INTESTINE 11/25/2008   Regional enteritis (HCC) 11/25/2008   Essential hypertension 08/22/2008   Elevated transaminase level 08/22/2008   OTHER ABNORMAL BLOOD CHEMISTRY 08/01/2008   ANXIETY DEPRESSION 06/27/2008   ACNE, MILD 06/27/2008   EDEMA 06/27/2008   Past Medical History:  Diagnosis Date   Acne    Anxiety    Colonic mass 11/08/2008   Crohn's disease (HCC)    Depression    Edema    Hyperlipidemia    Hypertension    PMS (premenstrual syndrome)    Past Surgical History:   Procedure Laterality Date   COLON SURGERY  2010   HEMICOLECTOMY  11/08/2008   Social History   Tobacco Use   Smoking status: Never   Smokeless tobacco: Never  Substance Use Topics   Alcohol use: No  Drug use: No   Family History  Problem Relation Age of Onset   Colon cancer Mother    Breast cancer Mother    Cancer Mother    Alcohol abuse Brother    Cancer Brother    Depression Brother    Learning disabilities Brother    Hypertension Paternal Grandfather    Diabetes Paternal Grandfather    Coronary artery disease Paternal Grandfather    Coronary artery disease Maternal Grandfather    Aneurysm Paternal Grandmother        cerebral   Hypertension Paternal Grandmother    Pulmonary embolism Father    Aneurysm Father        cerebral   Hyperlipidemia Father    Alcohol abuse Son    Drug abuse Son    ADD / ADHD Daughter    Depression Other        fam hx   Allergies  Allergen Reactions   Hydrochlorothiazide     Dizzy and felt faint   Ace Inhibitors     cough   Crestor [Rosuvastatin Calcium]     Memory    Fluoxetine Hcl     REACTION: urticartia   Tenormin [Atenolol]     fatigue   Venlafaxine     REACTION: weight gain, makes sleepy   Bupropion Hcl     REACTION: tinnitus previously but no other symptoms.  She tolerated it on second trial w/o return of tinnitus.     Current Outpatient Medications on File Prior to Visit  Medication Sig Dispense Refill   Evolocumab (REPATHA SURECLICK) 140 MG/ML SOAJ Inject 140 mg into the skin every 14 (fourteen) days. 6 mL 3   losartan (COZAAR) 100 MG tablet TAKE 1 TABLET BY MOUTH EVERY DAY 90 tablet 0   spironolactone (ALDACTONE) 25 MG tablet TAKE 1/2 TABLET BY MOUTH DAILY 45 tablet 0   No current facility-administered medications on file prior to visit.    Review of Systems  Constitutional:  Negative for activity change, appetite change, fatigue, fever and unexpected weight change.  HENT:  Negative for congestion, ear pain,  rhinorrhea, sinus pressure and sore throat.   Eyes:  Negative for pain, redness and visual disturbance.  Respiratory:  Negative for cough, shortness of breath and wheezing.   Cardiovascular:  Negative for chest pain and palpitations.  Gastrointestinal:  Negative for abdominal pain, blood in stool, constipation and diarrhea.  Endocrine: Negative for polydipsia and polyuria.  Genitourinary:  Negative for dysuria, frequency and urgency.  Musculoskeletal:  Negative for arthralgias, back pain and myalgias.  Skin:  Negative for pallor and rash.  Allergic/Immunologic: Negative for environmental allergies.  Neurological:  Negative for dizziness, syncope and headaches.  Hematological:  Negative for adenopathy. Does not bruise/bleed easily.  Psychiatric/Behavioral:  Negative for decreased concentration and dysphoric mood. The patient is nervous/anxious.        Objective:   Physical Exam Constitutional:      General: She is not in acute distress.    Appearance: Normal appearance. She is well-developed. She is obese. She is not ill-appearing or diaphoretic.  HENT:     Head: Normocephalic and atraumatic.     Right Ear: Tympanic membrane, ear canal and external ear normal.     Left Ear: Tympanic membrane, ear canal and external ear normal.     Nose: Nose normal. No congestion.     Mouth/Throat:     Mouth: Mucous membranes are moist.     Pharynx: Oropharynx is clear. No posterior oropharyngeal erythema.  Eyes:     General: No scleral icterus.    Extraocular Movements: Extraocular movements intact.     Conjunctiva/sclera: Conjunctivae normal.     Pupils: Pupils are equal, round, and reactive to light.  Neck:     Thyroid: No thyromegaly.     Vascular: No carotid bruit or JVD.  Cardiovascular:     Rate and Rhythm: Normal rate and regular rhythm.     Pulses: Normal pulses.     Heart sounds: Normal heart sounds.     No gallop.  Pulmonary:     Effort: Pulmonary effort is normal. No respiratory  distress.     Breath sounds: Normal breath sounds. No wheezing.     Comments: Good air exch Chest:     Chest wall: No tenderness.  Abdominal:     General: Bowel sounds are normal. There is no distension or abdominal bruit.     Palpations: Abdomen is soft. There is no mass.     Tenderness: There is no abdominal tenderness.     Hernia: No hernia is present.  Genitourinary:    Comments: Breast exam: No mass, nodules, thickening, tenderness, bulging, retraction, inflamation, nipple discharge or skin changes noted.  No axillary or clavicular LA.     Musculoskeletal:        General: No tenderness. Normal range of motion.     Cervical back: Normal range of motion and neck supple. No rigidity. No muscular tenderness.     Right lower leg: No edema.     Left lower leg: No edema.     Comments: No kyphosis   Lymphadenopathy:     Cervical: No cervical adenopathy.  Skin:    General: Skin is warm and dry.     Coloration: Skin is not pale.     Findings: No erythema or rash.     Comments: Solar lentigines diffusely Fair complexion   Neurological:     Mental Status: She is alert. Mental status is at baseline.     Cranial Nerves: No cranial nerve deficit.     Motor: No abnormal muscle tone.     Coordination: Coordination normal.     Gait: Gait normal.     Deep Tendon Reflexes: Reflexes are normal and symmetric. Reflexes normal.  Psychiatric:        Mood and Affect: Mood normal.        Cognition and Memory: Cognition and memory normal.           Assessment & Plan:   Problem List Items Addressed This Visit       Cardiovascular and Mediastinum   Essential hypertension   bp in fair control at this time  BP Readings from Last 1 Encounters:  01/31/24 139/88   No changes needed Most recent labs reviewed  Disc lifstyle change with low sodium diet and exercise  Losartan 100 mg daily  Aldactone 12.5 mg daily         Other   Tachycardia   Pulse Readings from Last 3 Encounters:   01/31/24 (!) 114  06/10/23 84  05/02/23 81   Always lower pulse at home Anxious in office  Monitors this  Under cardiology care       Routine general medical examination at a health care facility - Primary   Reviewed health habits including diet and exercise and skin cancer prevention Reviewed appropriate screening tests for age  Also reviewed health mt list, fam hx and immunization status , as well as social and family  history   See HPI Labs reviewed and ordered Health Maintenance  Topic Date Due   Mammogram  11/18/2023   Flu Shot  02/06/2024*   Zoster (Shingles) Vaccine (1 of 2) 05/02/2024*   Pap with HPV screening  01/30/2025*   COVID-19 Vaccine (4 - 2024-25 season) 02/15/2025*   Colon Cancer Screening  12/18/2029   DTaP/Tdap/Td vaccine (4 - Td or Tdap) 10/13/2032   Hepatitis C Screening  Completed   HPV Vaccine  Aged Out   HIV Screening  Discontinued  *Topic was postponed. The date shown is not the original due date.   Plans to get shingrix in pharmacy  Pt plans to schedule her mammogram  Discussed fall prevention, supplements and exercise for bone density  PHQ 4 - plans to return to TMS treatment/no longer on zoloft         Prediabetes   Lab Results  Component Value Date   HGBA1C 6.1 01/24/2024   HGBA1C 6.1 10/07/2022   HGBA1C 6.0 10/05/2021   disc imp of low glycemic diet and wt loss to prevent DM2       Obesity   Discussed how this problem influences overall health and the risks it imposes  Reviewed plan for weight loss with lower calorie diet (via better food choices (lower glycemic and portion control) along with exercise building up to or more than 30 minutes 5 days per week including some aerobic activity and strength training         Hyperlipidemia   Disc goals for lipids and reasons to control them Rev last labs with pt Rev low sat fat diet in detail LDL 95 On repatha  Sees cardiology       Elevated transaminase level   ALT 51- up  Alk  phos 160   No etoh  Occational tylenol   Plan [place] of liver  Likely fatty liver   Will continue to follow       Relevant Orders   US Abdomen Limited RUQ (LIVER/GB)   Elevated alkaline phosphatase level   Alk phos 160  ALT 51  This is up   Plan [place] abd  ? If possible fatty liver       Relevant Orders   US Abdomen Limited RUQ (LIVER/GB)   ANXIETY DEPRESSION   PHQ 4 Worse lately  Plans to return for TMS treatment that helped a lot  Off zoloft - made her too numb  Clonazepam prn -infrequent

## 2024-01-31 NOTE — Patient Instructions (Addendum)
 Please call the [facility] to schedule your mammogram   If you are interested in the shingles vaccine series (Shingrix), call your insurance or pharmacy to check on coverage and location it must be given.  If affordable - you can schedule it here or at your pharmacy depending on coverage   Try to get 1200-1500 mg of calcium per day with at least 2000 iu of vitamin D - for bone health If calcium constipates then just take the vitamin D   Try to walk /move more  Add some strength training to your routine, this is important for bone and brain health and can reduce your risk of falls and help your body use insulin properly and regulate weight  Light weights, exercise bands , and internet videos are a good way to start  Yoga (chair or regular), machines , floor exercises or a gym with machines are also good options   Consider returning for TMS   Avoid alcohol  Avoid tylenol for now   I put the referral in for abdominal US in [place]  Please let us know if you don't hear in 1-2 weeks   Check blood pressure at home when relaxed  Message [place] with some readings next week

## 2024-01-31 NOTE — Assessment & Plan Note (Signed)
 Reviewed health habits including diet and exercise and skin cancer prevention Reviewed appropriate screening tests for age  Also reviewed health mt list, fam hx and immunization status , as well as social and family history   See HPI Labs reviewed and ordered Health Maintenance  Topic Date Due   Mammogram  11/18/2023   Flu Shot  02/06/2024*   Zoster (Shingles) Vaccine (1 of 2) 05/02/2024*   Pap with HPV screening  01/30/2025*   COVID-19 Vaccine (4 - 2024-25 season) 02/15/2025*   Colon Cancer Screening  12/18/2029   DTaP/Tdap/Td vaccine (4 - Td or Tdap) 10/13/2032   Hepatitis C Screening  Completed   HPV Vaccine  Aged Out   HIV Screening  Discontinued  *Topic was postponed. The date shown is not the original due date.   Plans to get shingrix in pharmacy  Pt plans to schedule her mammogram  Discussed fall prevention, supplements and exercise for bone density  PHQ 4 - plans to return to TMS treatment/no longer on zoloft

## 2024-01-31 NOTE — Assessment & Plan Note (Signed)
 Alk phos 160  ALT 51  This is up   Plan [place] abd  ? If possible fatty liver

## 2024-01-31 NOTE — Assessment & Plan Note (Signed)
 Pulse Readings from Last 3 Encounters:  01/31/24 (!) 114  06/10/23 84  05/02/23 81   Always lower pulse at home Anxious in office  Monitors this  Under cardiology care

## 2024-01-31 NOTE — Assessment & Plan Note (Signed)
 ALT 51- up  Alk phos 160   No etoh  Occational tylenol   Plan [place] of liver  Likely fatty liver   Will continue to follow

## 2024-01-31 NOTE — Assessment & Plan Note (Signed)
 PHQ 4 Worse lately  Plans to return for TMS treatment that helped a lot  Off zoloft - made her too numb  Clonazepam prn -infrequent

## 2024-01-31 NOTE — Assessment & Plan Note (Signed)
 bp in fair control at this time  BP Readings from Last 1 Encounters:  01/31/24 139/88   No changes needed Most recent labs reviewed  Disc lifstyle change with low sodium diet and exercise  Losartan 100 mg daily  Aldactone 12.5 mg daily

## 2024-01-31 NOTE — Assessment & Plan Note (Signed)
 Disc goals for lipids and reasons to control them Rev last labs with pt Rev low sat fat diet in detail LDL 95 On repatha  Sees cardiology

## 2024-01-31 NOTE — Assessment & Plan Note (Signed)
 Lab Results  Component Value Date   HGBA1C 6.1 01/24/2024   HGBA1C 6.1 10/07/2022   HGBA1C 6.0 10/05/2021   disc imp of low glycemic diet and wt loss to prevent DM2

## 2024-02-08 ENCOUNTER — Encounter: Payer: Self-pay | Admitting: Family Medicine

## 2024-02-08 ENCOUNTER — Ambulatory Visit
Admission: RE | Admit: 2024-02-08 | Discharge: 2024-02-08 | Disposition: A | Discharge status: Home / Self Care | Source: Ambulatory Visit | Attending: Family Medicine | Admitting: Family Medicine

## 2024-02-08 DIAGNOSIS — K769 Liver disease, unspecified: Secondary | ICD-10-CM | POA: Diagnosis not present

## 2024-02-08 DIAGNOSIS — K8020 Calculus of gallbladder without cholecystitis without obstruction: Secondary | ICD-10-CM | POA: Diagnosis not present

## 2024-02-08 DIAGNOSIS — R748 Abnormal levels of other serum enzymes: Secondary | ICD-10-CM

## 2024-02-08 DIAGNOSIS — R7401 Elevation of levels of liver transaminase levels: Secondary | ICD-10-CM

## 2024-02-10 DIAGNOSIS — K8020 Calculus of gallbladder without cholecystitis without obstruction: Secondary | ICD-10-CM | POA: Insufficient documentation

## 2024-02-14 NOTE — Telephone Encounter (Signed)
 Will route to referral dpt so they can review her referral. See pt's last comments asking about referral being in [place] instead of [place] as requested

## 2024-02-15 ENCOUNTER — Telehealth: Payer: Self-pay | Admitting: Family Medicine

## 2024-02-15 DIAGNOSIS — K8020 Calculus of gallbladder without cholecystitis without obstruction: Secondary | ICD-10-CM

## 2024-02-15 DIAGNOSIS — R7401 Elevation of levels of liver transaminase levels: Secondary | ICD-10-CM

## 2024-02-15 NOTE — Telephone Encounter (Signed)
 I did the referral over because pt changed mind and wants to go to [place]   Let us know if you don't hear back   Thanks

## 2024-02-25 DIAGNOSIS — K047 Periapical abscess without sinus: Secondary | ICD-10-CM | POA: Diagnosis not present

## 2024-02-25 DIAGNOSIS — R6884 Jaw pain: Secondary | ICD-10-CM | POA: Diagnosis not present

## 2024-03-14 DIAGNOSIS — K8020 Calculus of gallbladder without cholecystitis without obstruction: Secondary | ICD-10-CM | POA: Diagnosis not present

## 2024-03-14 DIAGNOSIS — K760 Fatty (change of) liver, not elsewhere classified: Secondary | ICD-10-CM | POA: Diagnosis not present

## 2024-04-13 ENCOUNTER — Other Ambulatory Visit: Payer: Self-pay | Admitting: Family Medicine

## 2024-05-21 ENCOUNTER — Other Ambulatory Visit: Payer: Self-pay

## 2024-05-21 ENCOUNTER — Emergency Department (HOSPITAL_COMMUNITY)

## 2024-05-21 ENCOUNTER — Emergency Department (HOSPITAL_COMMUNITY)
Admission: EM | Admit: 2024-05-21 | Discharge: 2024-05-21 | Disposition: A | Discharge status: Home / Self Care | Attending: Emergency Medicine | Admitting: Emergency Medicine

## 2024-05-21 ENCOUNTER — Encounter (HOSPITAL_COMMUNITY): Payer: Self-pay | Admitting: *Deleted

## 2024-05-21 DIAGNOSIS — R112 Nausea with vomiting, unspecified: Secondary | ICD-10-CM | POA: Diagnosis not present

## 2024-05-21 DIAGNOSIS — D75839 Thrombocytosis, unspecified: Secondary | ICD-10-CM | POA: Insufficient documentation

## 2024-05-21 DIAGNOSIS — D72829 Elevated white blood cell count, unspecified: Secondary | ICD-10-CM | POA: Insufficient documentation

## 2024-05-21 DIAGNOSIS — R109 Unspecified abdominal pain: Secondary | ICD-10-CM | POA: Diagnosis not present

## 2024-05-21 DIAGNOSIS — R1013 Epigastric pain: Secondary | ICD-10-CM | POA: Insufficient documentation

## 2024-05-21 DIAGNOSIS — R739 Hyperglycemia, unspecified: Secondary | ICD-10-CM | POA: Insufficient documentation

## 2024-05-21 DIAGNOSIS — R079 Chest pain, unspecified: Secondary | ICD-10-CM | POA: Diagnosis not present

## 2024-05-21 DIAGNOSIS — K8020 Calculus of gallbladder without cholecystitis without obstruction: Secondary | ICD-10-CM | POA: Diagnosis not present

## 2024-05-21 DIAGNOSIS — R0789 Other chest pain: Secondary | ICD-10-CM | POA: Diagnosis not present

## 2024-05-21 DIAGNOSIS — K828 Other specified diseases of gallbladder: Secondary | ICD-10-CM | POA: Diagnosis not present

## 2024-05-21 DIAGNOSIS — K760 Fatty (change of) liver, not elsewhere classified: Secondary | ICD-10-CM | POA: Diagnosis not present

## 2024-05-21 LAB — COMPREHENSIVE METABOLIC PANEL WITH GFR
ALT: 31 U/L (ref 0–44)
AST: 36 U/L (ref 15–41)
Albumin: 3.9 g/dL (ref 3.5–5.0)
Alkaline Phosphatase: 126 U/L (ref 38–126)
Anion gap: 11 (ref 5–15)
BUN: 9 mg/dL (ref 8–23)
CO2: 23 mmol/L (ref 22–32)
Calcium: 9.4 mg/dL (ref 8.9–10.3)
Chloride: 103 mmol/L (ref 98–111)
Creatinine, Ser: 0.89 mg/dL (ref 0.44–1.00)
GFR, Estimated: >60 mL/min (ref >60)
Glucose, Bld: 164 mg/dL — ABNORMAL HIGH (ref 70–99)
Potassium: 4.6 mmol/L (ref 3.5–5.1)
Sodium: 137 mmol/L (ref 135–145)
Total Bilirubin: 1.5 mg/dL — ABNORMAL HIGH (ref 0.0–1.2)
Total Protein: 7 g/dL (ref 6.5–8.1)

## 2024-05-21 LAB — TROPONIN I (HIGH SENSITIVITY)
Troponin I (High Sensitivity): 11 ng/L (ref <18)
Troponin I (High Sensitivity): 4 ng/L (ref <18)

## 2024-05-21 LAB — URINALYSIS, ROUTINE W REFLEX MICROSCOPIC
Bacteria, UA: NONE SEEN
Bilirubin Urine: NEGATIVE
Glucose, UA: NEGATIVE mg/dL
Ketones, ur: 5 mg/dL — AB
Leukocytes,Ua: NEGATIVE
Nitrite: NEGATIVE
Protein, ur: NEGATIVE mg/dL
Specific Gravity, Urine: 1.023 (ref 1.005–1.030)
pH: 5 (ref 5.0–8.0)

## 2024-05-21 LAB — CBC
HCT: 42.2 % (ref 36.0–46.0)
Hemoglobin: 13.8 g/dL (ref 12.0–15.0)
MCH: 28.6 pg (ref 26.0–34.0)
MCHC: 32.7 g/dL (ref 30.0–36.0)
MCV: 87.6 fL (ref 80.0–100.0)
Platelets: 401 K/uL — ABNORMAL HIGH (ref 150–400)
RBC: 4.82 MIL/uL (ref 3.87–5.11)
RDW: 12.7 % (ref 11.5–15.5)
WBC: 15.4 K/uL — ABNORMAL HIGH (ref 4.0–10.5)
nRBC: 0 % (ref 0.0–0.2)

## 2024-05-21 LAB — LIPASE, BLOOD: Lipase: 27 U/L (ref 11–51)

## 2024-05-21 MED ORDER — ALUM & MAG HYDROXIDE-SIMETH 200-200-20 MG/5ML PO SUSP
30.0000 mL | Freq: Once | ORAL | Status: AC
  Administered 2024-05-21: 30 mL via ORAL
  Filled 2024-05-21: qty 30

## 2024-05-21 MED ORDER — ONDANSETRON 4 MG PO TBDP
4.0000 mg | ORAL_TABLET | Freq: Three times a day (TID) | ORAL | 0 refills | Status: DC | PRN
Start: 2024-05-21 — End: 2024-05-28

## 2024-05-21 MED ORDER — OXYCODONE-ACETAMINOPHEN 5-325 MG PO TABS
1.0000 | ORAL_TABLET | Freq: Once | ORAL | Status: AC
  Administered 2024-05-21: 1 via ORAL
  Filled 2024-05-21: qty 1

## 2024-05-21 MED ORDER — FAMOTIDINE 20 MG PO TABS
20.0000 mg | ORAL_TABLET | Freq: Once | ORAL | Status: AC
  Administered 2024-05-21: 20 mg via ORAL
  Filled 2024-05-21: qty 1

## 2024-05-21 MED ORDER — ONDANSETRON 4 MG PO TBDP
4.0000 mg | ORAL_TABLET | Freq: Once | ORAL | Status: AC
  Administered 2024-05-21: 4 mg via ORAL
  Filled 2024-05-21: qty 1

## 2024-05-21 NOTE — ED Notes (Signed)
 Patient transported to Ultrasound

## 2024-05-21 NOTE — ED Provider Notes (Signed)
 [place] EMERGENCY DEPARTMENT AT [facility] Provider Note   CSN: [id number] Arrival date & time: 05/21/24  9258     Patient presents with: Abdominal Pain   Veronica [name] is a 63 y.o. female.  With past history significant for transaminitis, cholelithiasis, obesity, hyperlipidemia presents to the emergency department with concerns of abdominal pain.  She reports epigastric and lower chest wall abdominal tenderness that developed last night around 10 PM with multiple episodes of vomiting.  She reports radiating pain from the epigastrium towards her back.  She states that she saw surgeon about 1 month ago that advised her that surgery was not indicated giving her gallstones or not causing very many symptoms.  Denies any shortness of breath and describes chest pain as central in nature with radiation from the epigastrium towards the superior aspect.  No reported hemoptysis, hematochezia, or melanotic stools.  She takes famotidine  for acid reflux and denies any significant improvement in symptoms.   Abdominal Pain      Prior to Admission medications   Medication Sig Start Date End Date Taking? Authorizing Provider  clonazePAM  (KLONOPIN ) 0.5 MG tablet Take 1 tablet (0.5 mg total) by mouth daily as needed for anxiety. 01/31/24   [name], Laine LABOR, MD  Evolocumab  (REPATHA  SURECLICK) 140 MG/ML SOAJ Inject 140 mg into the skin every 14 (fourteen) days. 11/24/23   Lonni Slain, MD  losartan  (COZAAR ) 100 MG tablet TAKE 1 TABLET BY MOUTH EVERY DAY 04/13/24   [name], Laine LABOR, MD  ondansetron  (ZOFRAN -ODT) 4 MG disintegrating tablet Take 1 tablet (4 mg total) by mouth every 8 (eight) hours as needed for nausea or vomiting. 05/21/24  Yes Audryanna Zurita A, PA-C  spironolactone  (ALDACTONE ) 25 MG tablet TAKE 1/2 TABLET BY MOUTH DAILY 04/13/24   [name], Laine LABOR, MD    Allergies: Hydrochlorothiazide, Ace inhibitors, Crestor  [rosuvastatin  calcium ], Fluoxetine hcl, Tenormin  [atenolol ], Venlafaxine,  and Bupropion hcl    Review of Systems  Gastrointestinal:  Positive for abdominal pain.  All other systems reviewed and are negative.   Updated Vital Signs BP 139/86 (BP Location: Right Arm)   Pulse 93   Temp 98.5 F (36.9 C) (Oral)   Resp 18   Ht 5' 8.5 (1.74 m)   Wt 91.6 kg   SpO2 100%   BMI 30.27 kg/m   Physical Exam Vitals and nursing note reviewed.  Constitutional:      General: She is not in acute distress.    Appearance: She is well-developed.  HENT:     Head: Normocephalic and atraumatic.  Eyes:     Conjunctiva/sclera: Conjunctivae normal.  Cardiovascular:     Rate and Rhythm: Normal rate and regular rhythm.     Heart sounds: No murmur heard. Pulmonary:     Effort: Pulmonary effort is normal. No respiratory distress.     Breath sounds: Normal breath sounds.  Abdominal:     Palpations: Abdomen is soft.     Tenderness: There is abdominal tenderness in the epigastric area. There is no guarding. Negative signs include Murphy's sign.  Musculoskeletal:        General: No swelling.     Cervical back: Neck supple.  Skin:    General: Skin is warm and dry.     Capillary Refill: Capillary refill takes less than 2 seconds.  Neurological:     Mental Status: She is alert.  Psychiatric:        Mood and Affect: Mood normal.     (all labs ordered  are listed, but only abnormal results are displayed) Labs Reviewed  COMPREHENSIVE METABOLIC PANEL WITH GFR - Abnormal; Notable for the following components:      Result Value   Glucose, Bld 164 (*)    Total Bilirubin 1.5 (*)    All other components within normal limits  CBC - Abnormal; Notable for the following components:   WBC 15.4 (*)    Platelets 401 (*)    All other components within normal limits  URINALYSIS, ROUTINE W REFLEX MICROSCOPIC - Abnormal; Notable for the following components:   Hgb urine dipstick MODERATE (*)    Ketones, ur 5 (*)    All other components within normal limits  LIPASE, BLOOD  TROPONIN I  (HIGH SENSITIVITY)  TROPONIN I (HIGH SENSITIVITY)    EKG: EKG Interpretation Date/Time:  Monday May 21 2024 09:53:06 EDT Ventricular Rate:  90 PR Interval:  150 QRS Duration:  72 QT Interval:  362 QTC Calculation: 442 R Axis:   26  Text Interpretation: Normal sinus rhythm nonspecific ST/T changes no significant change since earlier in the day Confirmed by Freddi Hamilton [phone]) on 05/21/2024 10:03:29 AM  Radiology: DG Chest 2 View Result Date: 05/21/2024 CLINICAL DATA:  Chest pain EXAM: CHEST - 2 VIEW COMPARISON:  November 05, 2008 FINDINGS: The heart size and mediastinal contours are within normal limits. Likely calcified pulmonary granuloma in the medial right lower lung. No focal consolidation. No pleural effusions or pneumothorax. The visualized skeletal structures are unremarkable. IMPRESSION: No active cardiopulmonary disease. Electronically Signed   By: Michaeline Blanch M.D.   On: 05/21/2024 10:59   US  Abdomen Limited RUQ (LIVER/GB) Result Date: 05/21/2024 CLINICAL DATA:  Abdominal pain EXAM: ULTRASOUND ABDOMEN LIMITED RIGHT UPPER QUADRANT COMPARISON:  02/08/2024 FINDINGS: Gallbladder: Gallbladder is distended. Shadowing stones. No reported Murphy's sign. No wall thickening. There is stone towards the neck of the gallbladder as well with some adjacent fluid. Common bile duct: Diameter: 4 mm Liver: Echogenic hepatic parenchyma consistent with some fatty liver infiltration. Areas of presumed fatty sparing near the margin of the gallbladder. Portal vein is patent on color Doppler imaging with normal direction of blood flow towards the liver. Other: None. IMPRESSION: Dilated gallbladder with stones. There is a stone towards the neck. Trace adjacent fluid. If there is further concern of acute cholecystitis HIDA scan may be useful to further delineate. No biliary ductal dilatation. Fatty liver infiltration. Electronically Signed   By: Ranell Bring M.D.   On: 05/21/2024 09:43     Procedures    Medications Ordered in the ED  oxyCODONE -acetaminophen  (PERCOCET/ROXICET) 5-325 MG per tablet 1 tablet (1 tablet Oral Given 05/21/24 1027)  ondansetron  (ZOFRAN -ODT) disintegrating tablet 4 mg (4 mg Oral Given 05/21/24 1028)  alum & mag hydroxide-simeth (MAALOX/MYLANTA) 200-200-20 MG/5ML suspension 30 mL (30 mLs Oral Given 05/21/24 1403)  famotidine  (PEPCID ) tablet 20 mg (20 mg Oral Given 05/21/24 1403)                                    Medical Decision Making Amount and/or Complexity of Data Reviewed Labs: ordered. Radiology: ordered.  Risk OTC drugs. Prescription drug management.   This patient presents to the ED for concern of abdominal/chest pain, this involves an extensive number of treatment options, and is a complaint that carries with it a high risk of complications and morbidity.  The differential diagnosis includes pancreatitis, cholangitis, cholelithiasis, jaundice   Co morbidities that  complicate the patient evaluation  Known cholelithiasis   Lab Tests:  I Ordered, and personally interpreted labs.  The pertinent results include: CBC with leukocytosis of 15.4 and slight thrombocytosis with platelets of 401, UA without any signs infection and some hemoglobin seen but no bilirubin present, lipase unremarkable at 27, CMP unremarkable with mild hyperglycemia 164 and total bilirubin at 1.5, troponin initially at 11 and repeat at 4   Imaging Studies ordered:  I ordered imaging studies including ultrasound of the right upper quadrant abdomen, x-ray chest I independently visualized and interpreted imaging which showed Dilated gallbladder with stones. There is a stone towards the neck. Trace adjacent fluid. If there is further concern of acute cholecystitis HIDA scan may be useful to further delineate. No biliary ductal dilatation. Fatty liver infiltration. I agree with the radiologist interpretation   Cardiac Monitoring: / EKG:  The patient was maintained on a cardiac  monitor.  I personally viewed and interpreted the cardiac monitored which showed an underlying rhythm of: sinus rhythm   Consultations Obtained:  I requested consultation with none,  and discussed lab and imaging findings as well as pertinent plan - they recommend: N/A   Problem List / ED Course / Critical interventions / Medication management  Patient with past history significant for GERD, cholelithiasis presents the ED with concerns of nausea, vomiting, and epigastric abdominal pain.  Reports the symptoms have been present for the last day or so.  Denies any hematemesis, medic easier, melanotic stools.  States that she was previously seen surgery for concerns of her gallstones but was advised that she cannot acquire operative management at that time. On exam, patient has tenderness towards the epigastrium.  No focal right upper quadrant tenderness.  Normal bowel sounds.  Will proceed ultrasound imaging to reassess for possible acute cholecystitis.  Added on cardiac labs given patient's reported feeling of chest pressure. Basic labs are thankfully reassuring with low mild leukocytosis possibly reactive in nature given patient's vomiting.  No evidence of severe dehydration.  UA without signs of infection.  Lipase unremarkable at 27.  Troponin unremarkable at 11 with repeat at 4.  Chest x-ray negative. On reevaluation after administration of Pepcid  and Maalox, patient reports improvement.  Will discharge home with Zofran  ODT for management of nausea. I ordered medication including Percocet, Zofran , Maalox, Pepcid  for pain, nausea, reflux Reevaluation of the patient after these medicines showed that the patient improved I have reviewed the patients home medicines and have made adjustments as needed   Test / Admission - Considered:  Patient requiring inpatient admission.  Final diagnoses:  Nausea and vomiting, unspecified vomiting type    ED Discharge Orders          Ordered     ondansetron  (ZOFRAN -ODT) 4 MG disintegrating tablet  Every 8 hours PRN        05/21/24 1501               Jacari Kirsten A, PA-C 05/21/24 1505    Freddi Hamilton, MD 05/25/24 1500

## 2024-05-21 NOTE — Discharge Instructions (Signed)
 You are seen in the emergency department today for concerns of abdominal pain, nausea and vomiting.  Your labs, imaging, and EKG were thankfully reassuring and I do suspect you may have a mild gastroenteritis.  I am sending home with a prescription nausea medication for the next 4 days.  Try to focus on hydration.  For any concerns of new or worsening symptoms, return to the emergency department.

## 2024-05-21 NOTE — ED Notes (Signed)
 Awaiting patient from lobby.

## 2024-05-21 NOTE — ED Triage Notes (Signed)
 C/o chest and epigastric pain radiating into her back  onset 10 pm last night ,  c/o vomiting several times. States she saw a Careers adviser about 1 month ago and was told she had a gallstone , however surgery wasn't indicated at that time.

## 2024-05-28 ENCOUNTER — Ambulatory Visit: Payer: Self-pay | Admitting: Family Medicine

## 2024-05-28 ENCOUNTER — Encounter: Payer: Self-pay | Admitting: Family Medicine

## 2024-05-28 ENCOUNTER — Ambulatory Visit: Admitting: Family Medicine

## 2024-05-28 VITALS — BP 102/68 | HR 101 | Temp 99.1°F | Ht 68.5 in | Wt 198.4 lb

## 2024-05-28 DIAGNOSIS — R1013 Epigastric pain: Secondary | ICD-10-CM | POA: Diagnosis not present

## 2024-05-28 DIAGNOSIS — K8020 Calculus of gallbladder without cholecystitis without obstruction: Secondary | ICD-10-CM

## 2024-05-28 DIAGNOSIS — F430 Acute stress reaction: Secondary | ICD-10-CM | POA: Diagnosis not present

## 2024-05-28 LAB — BASIC METABOLIC PANEL WITH GFR
BUN: 12 mg/dL (ref 6–23)
CO2: 29 meq/L (ref 19–32)
Calcium: 10 mg/dL (ref 8.4–10.5)
Chloride: 101 meq/L (ref 96–112)
Creatinine, Ser: 0.97 mg/dL (ref 0.40–1.20)
GFR: 63.11 mL/min (ref >60.00)
Glucose, Bld: 110 mg/dL — ABNORMAL HIGH (ref 70–99)
Potassium: 4.3 meq/L (ref 3.5–5.1)
Sodium: 140 meq/L (ref 135–145)

## 2024-05-28 LAB — CBC WITH DIFFERENTIAL/PLATELET
Basophils Absolute: 0.1 K/uL (ref 0.0–0.1)
Basophils Relative: 0.8 % (ref 0.0–3.0)
Eosinophils Absolute: 0.3 K/uL (ref 0.0–0.7)
Eosinophils Relative: 3.7 % (ref 0.0–5.0)
HCT: 40.3 % (ref 36.0–46.0)
Hemoglobin: 13.5 g/dL (ref 12.0–15.0)
Lymphocytes Relative: 30.2 % (ref 12.0–46.0)
Lymphs Abs: 2.1 K/uL (ref 0.7–4.0)
MCHC: 33.4 g/dL (ref 30.0–36.0)
MCV: 85.9 fl (ref 78.0–100.0)
Monocytes Absolute: 0.6 K/uL (ref 0.1–1.0)
Monocytes Relative: 8.2 % (ref 3.0–12.0)
Neutro Abs: 4 K/uL (ref 1.4–7.7)
Neutrophils Relative %: 57.1 % (ref 43.0–77.0)
Platelets: 466 K/uL — ABNORMAL HIGH (ref 150.0–400.0)
RBC: 4.69 Mil/uL (ref 3.87–5.11)
RDW: 13.1 % (ref 11.5–15.5)
WBC: 7 K/uL (ref 4.0–10.5)

## 2024-05-28 LAB — HEPATIC FUNCTION PANEL
ALT: 24 U/L (ref 0–35)
AST: 20 U/L (ref 0–37)
Albumin: 4.4 g/dL (ref 3.5–5.2)
Alkaline Phosphatase: 143 U/L — ABNORMAL HIGH (ref 39–117)
Bilirubin, Direct: 0 mg/dL (ref 0.0–0.3)
Total Bilirubin: 0.3 mg/dL (ref 0.2–1.2)
Total Protein: 7.6 g/dL (ref 6.0–8.3)

## 2024-05-28 MED ORDER — FAMOTIDINE 20 MG PO TABS: 20.0000 mg | ORAL_TABLET | Freq: Two times a day (BID) | ORAL | 3 refills | Status: AC

## 2024-05-28 NOTE — Assessment & Plan Note (Signed)
 Unsure if recent episode of n/v and epigastric pain were due to gastritis and stress or gallstones Reviewed [facility] note from prior - did not recommend surgery unless she becomes symptomatic Lab today  Discussed signs and symptoms of cholecystitis   Call back and Er precautions noted in detail today

## 2024-05-28 NOTE — Patient Instructions (Addendum)
 Start pepcid  20 mg daily for stomach acid If symptoms return- we need to consider gallbladder cause   Avoid fatty foods Avoid foods that trigger acid reflux   Take care of yourself   Labs today   Reach out to your preferred counselor  If you need a referral please let us  know

## 2024-05-28 NOTE — Assessment & Plan Note (Signed)
 Work stress is severe  Cannot retire for 3 y and family dependent on income Anx and dep symptoms at this time  No SI   Recommend return to counseling to start  She will reach out to most recent one Has had improvement with TMS also   .[email or web address]

## 2024-05-28 NOTE — Progress Notes (Signed)
 Subjective:    Patient ID: Veronica [name], female    DOB: 1961-06-26, 63 y.o.   MRN: [id number]  HPI  Wt Readings from Last 3 Encounters:  05/28/24 198 lb 6 oz (90 kg)  05/21/24 202 lb (91.6 kg)  01/31/24 204 lb 6 oz (92.7 kg)   29.72 kg/m  Vitals:   05/28/24 1159  BP: 102/68  Pulse: (!) 101  Temp: 99.1 F (37.3 C)  SpO2: 95%   Pt presents for follow up of ER visit for  Abd/epigastric pain in setting of known cholelithiasis  Chest pain  Nausea /vomiting  Severe stress-job   Happened at home  Night/ am  Could not get comfortable  Throwing up bile No diarrhea  Maybe low grade temp if any  Last thing she ate was popcorn (microwave)  Tuna sandwich with mayo earlier that day   Some GERD    Us -dilated gb with stones /stone towards the neck  No biliary ductal dilatation  Some fatty liver   Lab Results  Component Value Date   WBC 15.4 (H) 05/21/2024   HGB 13.8 05/21/2024   HCT 42.2 05/21/2024   MCV 87.6 05/21/2024   PLT 401 (H) 05/21/2024   Lab Results  Component Value Date   ALT 31 05/21/2024   AST 36 05/21/2024   ALKPHOS 126 05/21/2024   BILITOT 1.5 (H) 05/21/2024    Lab Results  Component Value Date   NA 137 05/21/2024   K 4.6 05/21/2024   CO2 23 05/21/2024   GLUCOSE 164 (H) 05/21/2024   BUN 9 05/21/2024   CREATININE 0.89 05/21/2024   CALCIUM  9.4 05/21/2024   GFR 71.10 01/24/2024   EGFR 72 10/19/2021   GFRNONAA >60 05/21/2024   Was treatment with pepcid  and maalox  Has improvement Sent home with zofran  for nausea  Oxycodone    DG Chest 2 View Result Date: 05/21/2024 CLINICAL DATA:  Chest pain EXAM: CHEST - 2 VIEW COMPARISON:  November 05, 2008 FINDINGS: The heart size and mediastinal contours are within normal limits. Likely calcified pulmonary granuloma in the medial right lower lung. No focal consolidation. No pleural effusions or pneumothorax. The visualized skeletal structures are unremarkable. IMPRESSION: No active cardiopulmonary  disease. Electronically Signed   By: Michaeline Blanch M.D.   On: 05/21/2024 10:59   US  Abdomen Limited RUQ (LIVER/GB) Result Date: 05/21/2024 CLINICAL DATA:  Abdominal pain EXAM: ULTRASOUND ABDOMEN LIMITED RIGHT UPPER QUADRANT COMPARISON:  02/08/2024 FINDINGS: Gallbladder: Gallbladder is distended. Shadowing stones. No reported Murphy's sign. No wall thickening. There is stone towards the neck of the gallbladder as well with some adjacent fluid. Common bile duct: Diameter: 4 mm Liver: Echogenic hepatic parenchyma consistent with some fatty liver infiltration. Areas of presumed fatty sparing near the margin of the gallbladder. Portal vein is patent on color Doppler imaging with normal direction of blood flow towards the liver. Other: None. IMPRESSION: Dilated gallbladder with stones. There is a stone towards the neck. Trace adjacent fluid. If there is further concern of acute cholecystitis HIDA scan may be useful to further delineate. No biliary ductal dilatation. Fatty liver infiltration. Electronically Signed   By: Ranell Bring M.D.   On: 05/21/2024 09:43    Of note pt did see Dr Dasie- gen surg at [facility] At that time gallstones were not symptomatic    Stress Severe Work issues  Can retire 3 years -family is dependent on her income now  Did TMS in past  Would consider returning to her last counselor  Patient Active Problem List   Diagnosis Date Noted   Epigastric pain 05/28/2024   Stress reaction 05/28/2024   Gallstones 02/10/2024   Family history of breast cancer 10/14/2022   Heterozygous familial hypercholesterolemia 11/20/2021   Lump in neck 10/12/2021   Elevated alkaline phosphatase level 10/07/2020   Family history of colon cancer 09/19/2019   Family history of cerebral aneurysm 09/01/2016   Obesity 09/01/2016   Prediabetes 08/31/2016   Family history of early CAD 05/07/2015   Palpitations 03/11/2015   Encounter for routine gynecological examination 03/11/2015   Exercise  intolerance 03/11/2015   Tachycardia 07/16/2014   Hyperlipidemia 11/16/2011   Fibrocystic breast 11/16/2011   Routine general medical examination at a health care facility 11/07/2011   RGN ENTERITIS SMALL INTESTINE W/LG INTESTINE 11/25/2008   Regional enteritis (HCC) 11/25/2008   Essential hypertension 08/22/2008   Elevated transaminase level 08/22/2008   OTHER ABNORMAL BLOOD CHEMISTRY 08/01/2008   ANXIETY DEPRESSION 06/27/2008   ACNE, MILD 06/27/2008   EDEMA 06/27/2008   Past Medical History:  Diagnosis Date   Acne    Anxiety    Colonic mass 11/08/2008   Crohn's disease (HCC)    Depression    Edema    GERD (gastroesophageal reflux disease) 05/21/24   Hyperlipidemia    Hypertension    PMS (premenstrual syndrome)    Past Surgical History:  Procedure Laterality Date   COLON SURGERY  2010   HEMICOLECTOMY  11/08/2008   Social History   Tobacco Use   Smoking status: Never   Smokeless tobacco: Never  Substance Use Topics   Alcohol use: No   Drug use: No   Family History  Problem Relation Age of Onset   Colon cancer Mother    Breast cancer Mother    Cancer Mother    Alcohol abuse Brother    Cancer Brother    Depression Brother    Learning disabilities Brother    Hypertension Paternal Grandfather    Diabetes Paternal Grandfather    Coronary artery disease Paternal Grandfather    Coronary artery disease Maternal Grandfather    Aneurysm Paternal Grandmother        cerebral   Hypertension Paternal Grandmother    Pulmonary embolism Father    Aneurysm Father        cerebral   Hyperlipidemia Father    Alcohol abuse Son    Drug abuse Son    ADD / ADHD Daughter    Depression Other        fam hx   Allergies  Allergen Reactions   Hydrochlorothiazide     Dizzy and felt faint   Ace Inhibitors     cough   Crestor  [Rosuvastatin  Calcium ]     Memory    Fluoxetine Hcl     REACTION: urticartia   Tenormin  [Atenolol ]     fatigue   Venlafaxine     REACTION: weight  gain, makes sleepy   Bupropion Hcl     REACTION: tinnitus previously but no other symptoms.  She tolerated it on second trial w/o return of tinnitus.     Current Outpatient Medications on File Prior to Visit  Medication Sig Dispense Refill   clonazePAM  (KLONOPIN ) 0.5 MG tablet Take 1 tablet (0.5 mg total) by mouth daily as needed for anxiety. 30 tablet 0   Evolocumab  (REPATHA  SURECLICK) 140 MG/ML SOAJ Inject 140 mg into the skin every 14 (fourteen) days. 6 mL 3   losartan  (COZAAR ) 100 MG tablet TAKE 1 TABLET BY MOUTH EVERY  DAY 90 tablet 2   spironolactone  (ALDACTONE ) 25 MG tablet TAKE 1/2 TABLET BY MOUTH DAILY 45 tablet 2   No current facility-administered medications on file prior to visit.    Review of Systems  Constitutional:  Positive for fatigue. Negative for activity change, appetite change, fever and unexpected weight change.  HENT:  Negative for congestion, ear pain, rhinorrhea, sinus pressure and sore throat.   Eyes:  Negative for pain, redness and visual disturbance.  Respiratory:  Negative for cough, shortness of breath and wheezing.   Cardiovascular:  Negative for chest pain and palpitations.  Gastrointestinal:  Positive for abdominal pain. Negative for abdominal distention, blood in stool, constipation, diarrhea, nausea, rectal pain and vomiting.  Endocrine: Negative for polydipsia and polyuria.  Genitourinary:  Negative for dysuria, frequency and urgency.  Musculoskeletal:  Negative for arthralgias, back pain and myalgias.  Skin:  Negative for pallor and rash.  Allergic/Immunologic: Negative for environmental allergies.  Neurological:  Negative for dizziness, syncope and headaches.  Hematological:  Negative for adenopathy. Does not bruise/bleed easily.  Psychiatric/Behavioral:  Positive for dysphoric mood. Negative for decreased concentration. The patient is nervous/anxious.        Objective:   Physical Exam Constitutional:      General: She is not in acute  distress.    Appearance: Normal appearance. She is well-developed. She is obese. She is not ill-appearing or diaphoretic.  HENT:     Head: Normocephalic and atraumatic.  Eyes:     Conjunctiva/sclera: Conjunctivae normal.     Pupils: Pupils are equal, round, and reactive to light.  Neck:     Thyroid : No thyromegaly.     Vascular: No carotid bruit or JVD.  Cardiovascular:     Rate and Rhythm: Normal rate and regular rhythm.     Heart sounds: Normal heart sounds.     No gallop.  Pulmonary:     Effort: Pulmonary effort is normal. No respiratory distress.     Breath sounds: Normal breath sounds. No wheezing or rales.  Abdominal:     General: Abdomen is protuberant. Bowel sounds are normal. There is no distension or abdominal bruit.     Palpations: Abdomen is soft. There is no shifting dullness, fluid wave, hepatomegaly, splenomegaly, mass or pulsatile mass.     Tenderness: There is no abdominal tenderness.     Hernia: No hernia is present.  Musculoskeletal:     Cervical back: Normal range of motion and neck supple.     Right lower leg: No edema.     Left lower leg: No edema.  Lymphadenopathy:     Cervical: No cervical adenopathy.  Skin:    General: Skin is warm and dry.     Coloration: Skin is not pale.     Findings: No rash.  Neurological:     Mental Status: She is alert.     Coordination: Coordination normal.     Deep Tendon Reflexes: Reflexes are normal and symmetric. Reflexes normal.  Psychiatric:        Attention and Perception: Attention normal.        Mood and Affect: Mood is anxious and depressed. Affect is tearful.        Speech: Speech normal.        Behavior: Behavior normal.        Cognition and Memory: Cognition and memory normal.     Comments: Candidly discusses symptoms and stressors             Assessment & Plan:  Problem List Items Addressed This Visit       Digestive   Gallstones   Unsure if recent episode of n/v and epigastric pain were due to  gastritis and stress or gallstones Reviewed [facility] note from prior - did not recommend surgery unless she becomes symptomatic Lab today  Discussed signs and symptoms of cholecystitis   Call back and Er precautions noted in detail today        Relevant Orders   CBC with Differential/Platelet   Hepatic function panel   Basic metabolic panel with GFR     Other   Stress reaction   Work stress is severe  Cannot retire for 45 y and family dependent on income Anx and dep symptoms at this time  No SI   Recommend return to counseling to start  She will reach out to most recent one Has had improvement with TMS also   .[email or web address]         Epigastric pain - Primary   With ER visit on 7/14 Reviewed hospital records, lab results and studies in detail  Reassuring work up  Symptoms now resolved ? If related to gallstones or stress/ gastritis  Was treatment with maalox/pepcid  /pain med and zofran    Reassuring exam today  Labs today  If this re occurs may need surgical follow up for gallstones       Relevant Orders   CBC with Differential/Platelet   Hepatic function panel   Basic metabolic panel with GFR

## 2024-05-28 NOTE — Assessment & Plan Note (Signed)
 With ER visit on 7/14 Reviewed hospital records, lab results and studies in detail  Reassuring work up  Symptoms now resolved ? If related to gallstones or stress/ gastritis  Was treatment with maalox/pepcid  /pain med and zofran    Reassuring exam today  Labs today  If this re occurs may need surgical follow up for gallstones

## 2024-09-28 ENCOUNTER — Telehealth: Payer: Self-pay | Admitting: *Deleted

## 2024-09-28 NOTE — Telephone Encounter (Signed)
 Left VM requesting pt to call back.  She's due for mammogram (on PCP's list), need to see if she would like to get mammogram here on 10/12/24 (mobile mammo) or if she needs help getting one set up

## 2024-10-23 ENCOUNTER — Other Ambulatory Visit: Payer: Self-pay | Admitting: Cardiology

## 2024-10-25 MED ORDER — CLONAZEPAM 0.5 MG PO TABS: 0.5000 mg | ORAL_TABLET | Freq: Every day | ORAL | 0 refills | Status: AC | PRN

## 2024-10-25 NOTE — Telephone Encounter (Signed)
 Name of Medication: Klonopin  Name of Pharmacy: [facility] Last Fill or Written Date and Quantity: 01/31/24 #30 tab/ 0 refill Last Office Visit and Type: Hospital f/u 05/28/24 Next Office Visit and Type: none scheduled

## 2024-11-05 ENCOUNTER — Other Ambulatory Visit: Payer: Self-pay | Admitting: Cardiology

## 2024-11-05 DIAGNOSIS — E782 Mixed hyperlipidemia: Secondary | ICD-10-CM

## 2024-11-05 DIAGNOSIS — E78011 Heterozygous familial hypercholesterolemia (hefh): Secondary | ICD-10-CM

## 2024-11-06 ENCOUNTER — Ambulatory Visit (HOSPITAL_BASED_OUTPATIENT_CLINIC_OR_DEPARTMENT_OTHER): Admitting: Nurse Practitioner

## 2025-01-10 ENCOUNTER — Ambulatory Visit (HOSPITAL_BASED_OUTPATIENT_CLINIC_OR_DEPARTMENT_OTHER): Admitting: Cardiology
